# Patient Record
Sex: Female | Born: 1970
Health system: Southern US, Community
[De-identification: ages and names within clinical notes are randomized; demographics above are authoritative.]

## PROBLEM LIST (undated history)

## (undated) DIAGNOSIS — N3090 Cystitis, unspecified without hematuria: Secondary | ICD-10-CM

## (undated) DIAGNOSIS — K219 Gastro-esophageal reflux disease without esophagitis: Secondary | ICD-10-CM

## (undated) DIAGNOSIS — R519 Headache, unspecified: Secondary | ICD-10-CM

## (undated) DIAGNOSIS — Z9889 Other specified postprocedural states: Secondary | ICD-10-CM

## (undated) DIAGNOSIS — D219 Benign neoplasm of connective and other soft tissue, unspecified: Secondary | ICD-10-CM

## (undated) DIAGNOSIS — T7840XA Allergy, unspecified, initial encounter: Secondary | ICD-10-CM

## (undated) DIAGNOSIS — E538 Deficiency of other specified B group vitamins: Secondary | ICD-10-CM

## (undated) DIAGNOSIS — A749 Chlamydial infection, unspecified: Secondary | ICD-10-CM

## (undated) DIAGNOSIS — B009 Herpesviral infection, unspecified: Secondary | ICD-10-CM

## (undated) DIAGNOSIS — R87619 Unspecified abnormal cytological findings in specimens from cervix uteri: Secondary | ICD-10-CM

## (undated) DIAGNOSIS — Z8719 Personal history of other diseases of the digestive system: Secondary | ICD-10-CM

## (undated) DIAGNOSIS — I499 Cardiac arrhythmia, unspecified: Secondary | ICD-10-CM

## (undated) DIAGNOSIS — T783XXA Angioneurotic edema, initial encounter: Secondary | ICD-10-CM

## (undated) DIAGNOSIS — J45909 Unspecified asthma, uncomplicated: Secondary | ICD-10-CM

## (undated) DIAGNOSIS — B9689 Other specified bacterial agents as the cause of diseases classified elsewhere: Secondary | ICD-10-CM

## (undated) DIAGNOSIS — M1990 Unspecified osteoarthritis, unspecified site: Secondary | ICD-10-CM

## (undated) DIAGNOSIS — I10 Essential (primary) hypertension: Secondary | ICD-10-CM

## (undated) DIAGNOSIS — R51 Headache: Secondary | ICD-10-CM

## (undated) DIAGNOSIS — R011 Cardiac murmur, unspecified: Secondary | ICD-10-CM

## (undated) DIAGNOSIS — K469 Unspecified abdominal hernia without obstruction or gangrene: Secondary | ICD-10-CM

## (undated) DIAGNOSIS — R112 Nausea with vomiting, unspecified: Secondary | ICD-10-CM

## (undated) DIAGNOSIS — IMO0002 Reserved for concepts with insufficient information to code with codable children: Secondary | ICD-10-CM

## (undated) DIAGNOSIS — D649 Anemia, unspecified: Secondary | ICD-10-CM

## (undated) DIAGNOSIS — N760 Acute vaginitis: Secondary | ICD-10-CM

## (undated) DIAGNOSIS — B379 Candidiasis, unspecified: Secondary | ICD-10-CM

## (undated) HISTORY — DX: Angioneurotic edema, initial encounter: T78.3XXA

## (undated) HISTORY — DX: Unspecified abnormal cytological findings in specimens from cervix uteri: R87.619

## (undated) HISTORY — DX: Candidiasis, unspecified: B37.9

## (undated) HISTORY — DX: Reserved for concepts with insufficient information to code with codable children: IMO0002

## (undated) HISTORY — DX: Herpesviral infection, unspecified: B00.9

## (undated) HISTORY — DX: Unspecified asthma, uncomplicated: J45.909

## (undated) HISTORY — DX: Unspecified osteoarthritis, unspecified site: M19.90

## (undated) HISTORY — DX: Anemia, unspecified: D64.9

## (undated) HISTORY — DX: Cystitis, unspecified without hematuria: N30.90

## (undated) HISTORY — DX: Allergy, unspecified, initial encounter: T78.40XA

## (undated) HISTORY — DX: Chlamydial infection, unspecified: A74.9

## (undated) HISTORY — DX: Unspecified abdominal hernia without obstruction or gangrene: K46.9

## (undated) HISTORY — DX: Cardiac murmur, unspecified: R01.1

## (undated) HISTORY — PX: COLONOSCOPY: SHX174

## (undated) HISTORY — PX: BREAST LUMPECTOMY: SHX2

## (undated) HISTORY — PX: BREAST BIOPSY: SHX20

## (undated) HISTORY — DX: Benign neoplasm of connective and other soft tissue, unspecified: D21.9

## (undated) HISTORY — PX: CHOLECYSTECTOMY: SHX55

## (undated) HISTORY — DX: Acute vaginitis: N76.0

## (undated) HISTORY — DX: Other specified bacterial agents as the cause of diseases classified elsewhere: B96.89

---

## 1999-11-21 ENCOUNTER — Other Ambulatory Visit: Admission: RE | Admit: 1999-11-21 | Discharge: 1999-11-21 | Payer: Self-pay | Admitting: Family Medicine

## 2000-12-28 ENCOUNTER — Encounter: Payer: Self-pay | Admitting: Family Medicine

## 2000-12-28 ENCOUNTER — Encounter: Admission: RE | Admit: 2000-12-28 | Discharge: 2000-12-28 | Payer: Self-pay | Admitting: Family Medicine

## 2002-07-28 ENCOUNTER — Other Ambulatory Visit: Admission: RE | Admit: 2002-07-28 | Discharge: 2002-07-28 | Payer: Self-pay | Admitting: Obstetrics and Gynecology

## 2002-09-16 ENCOUNTER — Encounter: Payer: Self-pay | Admitting: Obstetrics and Gynecology

## 2002-09-16 ENCOUNTER — Ambulatory Visit (HOSPITAL_COMMUNITY): Admission: RE | Admit: 2002-09-16 | Discharge: 2002-09-16 | Payer: Self-pay | Admitting: Obstetrics and Gynecology

## 2002-10-23 ENCOUNTER — Encounter: Admission: RE | Admit: 2002-10-23 | Discharge: 2002-10-23 | Payer: Self-pay | Admitting: Gastroenterology

## 2002-10-23 ENCOUNTER — Encounter: Payer: Self-pay | Admitting: Gastroenterology

## 2002-12-05 ENCOUNTER — Observation Stay (HOSPITAL_COMMUNITY): Admission: RE | Admit: 2002-12-05 | Discharge: 2002-12-06 | Payer: Self-pay | Admitting: Surgery

## 2002-12-05 ENCOUNTER — Encounter (INDEPENDENT_AMBULATORY_CARE_PROVIDER_SITE_OTHER): Payer: Self-pay | Admitting: *Deleted

## 2002-12-05 ENCOUNTER — Encounter: Payer: Self-pay | Admitting: Surgery

## 2003-01-19 ENCOUNTER — Other Ambulatory Visit: Admission: RE | Admit: 2003-01-19 | Discharge: 2003-01-19 | Payer: Self-pay | Admitting: Obstetrics and Gynecology

## 2003-07-30 ENCOUNTER — Other Ambulatory Visit: Admission: RE | Admit: 2003-07-30 | Discharge: 2003-07-30 | Payer: Self-pay | Admitting: Obstetrics and Gynecology

## 2003-09-17 ENCOUNTER — Other Ambulatory Visit: Admission: RE | Admit: 2003-09-17 | Discharge: 2003-09-17 | Payer: Self-pay | Admitting: Obstetrics and Gynecology

## 2003-11-27 ENCOUNTER — Other Ambulatory Visit: Admission: RE | Admit: 2003-11-27 | Discharge: 2003-11-27 | Payer: Self-pay | Admitting: Obstetrics and Gynecology

## 2004-04-29 ENCOUNTER — Other Ambulatory Visit: Admission: RE | Admit: 2004-04-29 | Discharge: 2004-04-29 | Payer: Self-pay | Admitting: Obstetrics and Gynecology

## 2004-08-03 ENCOUNTER — Emergency Department (HOSPITAL_COMMUNITY): Admission: EM | Admit: 2004-08-03 | Discharge: 2004-08-03 | Payer: Self-pay | Admitting: Emergency Medicine

## 2004-08-05 ENCOUNTER — Other Ambulatory Visit: Admission: RE | Admit: 2004-08-05 | Discharge: 2004-08-05 | Payer: Self-pay | Admitting: Obstetrics and Gynecology

## 2004-08-23 ENCOUNTER — Encounter: Admission: RE | Admit: 2004-08-23 | Discharge: 2004-08-23 | Payer: Self-pay | Admitting: Neurology

## 2004-10-14 ENCOUNTER — Ambulatory Visit (HOSPITAL_COMMUNITY): Admission: RE | Admit: 2004-10-14 | Discharge: 2004-10-14 | Payer: Self-pay | Admitting: Gastroenterology

## 2004-10-14 ENCOUNTER — Encounter (INDEPENDENT_AMBULATORY_CARE_PROVIDER_SITE_OTHER): Payer: Self-pay | Admitting: Specialist

## 2005-05-11 ENCOUNTER — Encounter: Admission: RE | Admit: 2005-05-11 | Discharge: 2005-05-11 | Payer: Self-pay | Admitting: Family Medicine

## 2005-08-11 ENCOUNTER — Other Ambulatory Visit: Admission: RE | Admit: 2005-08-11 | Discharge: 2005-08-11 | Payer: Self-pay | Admitting: Obstetrics and Gynecology

## 2005-11-28 ENCOUNTER — Encounter: Admission: RE | Admit: 2005-11-28 | Discharge: 2005-11-28 | Payer: Self-pay | Admitting: Family Medicine

## 2006-08-13 ENCOUNTER — Other Ambulatory Visit: Admission: RE | Admit: 2006-08-13 | Discharge: 2006-08-13 | Payer: Self-pay | Admitting: Obstetrics and Gynecology

## 2007-09-11 ENCOUNTER — Encounter: Admission: RE | Admit: 2007-09-11 | Discharge: 2007-09-11 | Payer: Self-pay | Admitting: Family Medicine

## 2010-06-14 ENCOUNTER — Encounter: Admission: RE | Admit: 2010-06-14 | Discharge: 2010-06-14 | Payer: Self-pay | Admitting: Obstetrics and Gynecology

## 2010-07-22 ENCOUNTER — Encounter: Admission: RE | Admit: 2010-07-22 | Discharge: 2010-07-22 | Payer: Self-pay | Admitting: Surgery

## 2010-10-16 ENCOUNTER — Encounter: Payer: Self-pay | Admitting: Family Medicine

## 2011-02-10 NOTE — Op Note (Signed)
NAME:  RAFEEF, Jaclyn Haynes                       ACCOUNT NO.:  [id number]   MEDICAL RECORD NO.:  [id number]                   PATIENT TYPE:  AMB   LOCATION:  DAY                                  FACILITY:  [facility]   PHYSICIAN:  Abigail Miyamoto, M.D.              DATE OF BIRTH:  January 06, 1971   DATE OF PROCEDURE:  12/05/2002  DATE OF DISCHARGE:                                 OPERATIVE REPORT   PREOPERATIVE DIAGNOSIS:  Biliary dyskinesia.   POSTOPERATIVE DIAGNOSIS:  Biliary dyskinesia.   PROCEDURE:  Laparoscopic cholecystectomy with intraoperative cholangiogram.   SURGEON:  Douglas A. Magnus Ivan, M.D.   ASSISTANT:  Angelia Mould. Derrell Lolling, M.D.   ANESTHESIA:  General endotracheal anesthesia.   ESTIMATED BLOOD LOSS:  Minimal.   FINDINGS:  The patient was found to have a normal cholangiogram.   PROCEDURE IN DETAIL:  The patient brought to the operating room and  identified as Jaclyn Haynes.  She was placed supine on the operating room  table, and general anesthesia was induced.  Her abdomen was then prepped and  draped in the usual sterile fashion.  Using a #11 blade, a small transverse  incision was made at the umbilicus.  The incision was carried down through  the fascia which was then opened with a scalpel.  A hemostat was then used  to pass into the peritoneal cavity.  Next, a 0 Vicryl pursestring suture was  placed around the fascial opening.  The Hasson port was placed through the  opening, and insufflation of the abdomen was begun.  Next, an 11 mm port was  placed in the patient's epigastrium, and two 5 mm ports were placed in the  patient's right flank under direct vision.  The gallbladder was then  identified and retracted above the liver bed.  Dissection was then carried  out in the base of the gallbladder.  The cystic duct was dissected out.  A  small adhesion from the cystic duct to the capsule of liver was taken down  with electrocautery. This tore the liver capsule some, but  the tear was  controlled with the electrocautery.  Cystic duct was then clipped once  distally.  It was then partly transected with the laparoscopic scissors.  An  angiocatheter was inserted in the right upper quadrant under direct vision.  The cholangiocatheter was then placed through the Angiocath and then put  into the opening of the cystic duct.  A cholangiogram was then performed  under direct fluoroscopy.  Good flow of contrast was seen through the entire  biliary system and duodenum without evidence of abnormality.  Next the  cholangiocatheter was then removed.  The cystic duct was then clipped three  times proximally and transected with scissors.  The cystic artery was then  identified and twice proximally, once distally, and transected as well.  The  gallbladder was then slowly dissected free from the liver bed with the  electrocautery.  Once the gallbladder was freed from the liver bed, the  liver bed was examined and again, hemostasis was achieved in the liver bed  as well as on the capsular tear.  The gallbladder was then removed through  the incision at the umbilicus.  The 0 Vicryl then brought out and tied in  place, closing the fascial defect.  The abdomen was then copiously irrigated  with normal saline.  Again, hemostasis appeared to be achieved.  All ports  were then removed under direct vision, and the abdomen was deflated.  All  incisions were then anesthetized with 0.5% Marcaine and  then closed with 4-0 Monocryl subcuticular sutures.  Steri-Strips, gauze,  and tape were then applied.  The patient tolerated the procedure well.  All  sponge, needle, and instrument counts were correct at the end of the  procedure.  The patient was then extubated in the operating room and taken  in stable condition to the recovery room.                                               Abigail Miyamoto, M.D.    DB/MEDQ  D:  12/05/2002  T:  12/05/2002  Job:  [id number]

## 2011-02-10 NOTE — Op Note (Signed)
NAME:  Jaclyn Haynes, Jaclyn Haynes             ACCOUNT NO.:  [id number]   MEDICAL RECORD NO.:  [id number]          PATIENT TYPE:  AMB   LOCATION:  ENDO                         FACILITY:  [facility]   PHYSICIAN:  Anselmo Rod, M.D.  DATE OF BIRTH:  1970/11/24   DATE OF PROCEDURE:  10/14/2004  DATE OF DISCHARGE:                                 OPERATIVE REPORT   PROCEDURE:  Colonoscopy with random biopsies.   ENDOSCOPIST:  Anselmo Rod, M.D.   INSTRUMENT USED:  Olympus video colonoscope.   INDICATIONS FOR PROCEDURE:  The patient is a 40 year old African-American  female with a history of severe diarrhea explosive in nature with mucoid  stools.  Rule out inflammatory bowel disease.  The patient has not responded  to cholestyramine or high fiber diet.   PRE-PROCEDURE PREPARATION:  Informed consent was procured from the patient.  The patient was fasted for eight hours prior to the procedure and prepped  with a bottle of magnesium citrate and a gallon of GoLYTELY the night prior  to the procedure.  The risks and benefits of the procedure were discussed  with the patient including 10% miss rate of cancer and polyps.   PRE-PROCEDURE PHYSICAL:  VITAL SIGNS:  The patient has stable vital signs.  NECK:  Supple.  CHEST:  Clear to auscultation.  CARDIOVASCULAR:  S1 and S2 are regular.  ABDOMEN:  Soft with normal bowel sounds.   DESCRIPTION OF PROCEDURE:  The patient was placed in the left lateral  decubitus position and sedated with 75 mg of Demerol and 6 mg of Versed in  slow incremental doses.  Once the patient was adequately sedated and  maintained on low flow oxygen and continuous cardiac monitoring, the Olympus  video colonoscope was advanced from the rectum to the cecum. The appendiceal  orifice and ileocecal valve were clearly visualized and photographed.  The  entire colonic mucosa appeared healthy including the terminal ileum.  Random  colon biopsies were done to rule out microscopic or  collagenous colitis.  There was no evidence of inflammatory bowel disease.  Retroflexion in the  rectum revealed no abnormalities.  The patient tolerated the procedure well  without any complications.   IMPRESSION:  Normal colonoscopy up to the terminal ileum, random colon  biopsies done; results pending.   RECOMMENDATIONS:  1.  Await pathology results.  2.  Trial of antispasmodics, Levbid 0.375 mg b.i.d. has been called into the      patient's pharmacy for the next one month.  3.  Outpatient follow-up in the next two weeks for further recommendations.                                               ______________________________  Anselmo Rod, M.D.  Electronically Signed 10/17/2004 11:20:16am EST    JNM/MEDQ  D:  10/14/2004  T:  10/14/2004  Job:  [id number]   cc:   Gretta Arab. Valentina Lucks, M.D.  [address]  [place] [address]  Fax: [phone]

## 2011-08-15 ENCOUNTER — Other Ambulatory Visit: Payer: Self-pay | Admitting: Obstetrics and Gynecology

## 2011-08-15 DIAGNOSIS — Z1231 Encounter for screening mammogram for malignant neoplasm of breast: Secondary | ICD-10-CM

## 2011-09-22 ENCOUNTER — Ambulatory Visit
Admission: RE | Admit: 2011-09-22 | Discharge: 2011-09-22 | Disposition: A | Discharge status: Home / Self Care | Payer: 59 | Source: Ambulatory Visit | Attending: Obstetrics and Gynecology | Admitting: Obstetrics and Gynecology

## 2011-09-22 DIAGNOSIS — Z1231 Encounter for screening mammogram for malignant neoplasm of breast: Secondary | ICD-10-CM

## 2012-03-24 ENCOUNTER — Other Ambulatory Visit: Payer: Self-pay | Admitting: Obstetrics and Gynecology

## 2012-03-25 NOTE — Telephone Encounter (Deleted)
Can this pt have rx?

## 2012-08-09 ENCOUNTER — Encounter: Payer: Self-pay | Admitting: Obstetrics and Gynecology

## 2012-08-13 ENCOUNTER — Encounter: Payer: Self-pay | Admitting: Obstetrics and Gynecology

## 2012-08-13 ENCOUNTER — Encounter (INDEPENDENT_AMBULATORY_CARE_PROVIDER_SITE_OTHER): Payer: 59 | Admitting: Obstetrics and Gynecology

## 2012-08-13 NOTE — Progress Notes (Deleted)
Last Pap: 08/30/11 WNL: Yes Regular Periods:no Contraception: pill-yasmin  Monthly Breast exam:no Tetanus<57yrs:yes Nl.Bladder Function:no urinary frequency  Daily BMs:no Healthy Diet:no Calcium:yes Mammogram:yes Date of Mammogram: 09/22/11 Exercise:{YES NO:22349} Have often Exercise: *** Seatbelt: {yes no:314532} Abuse at home: {yes no:314532} Stressful work:{yes no:314532} Sigmoid-colonoscopy: *** Bone Density: {EXAM; YES/NO:19492::"No"} PCP: *** Change in PMH: *** Change in [facility]:***

## 2012-08-27 ENCOUNTER — Other Ambulatory Visit: Payer: Self-pay | Admitting: Obstetrics and Gynecology

## 2012-08-27 DIAGNOSIS — Z1231 Encounter for screening mammogram for malignant neoplasm of breast: Secondary | ICD-10-CM

## 2012-09-05 ENCOUNTER — Encounter: Payer: Self-pay | Admitting: Obstetrics and Gynecology

## 2012-09-05 ENCOUNTER — Ambulatory Visit (INDEPENDENT_AMBULATORY_CARE_PROVIDER_SITE_OTHER): Payer: 59 | Admitting: Obstetrics and Gynecology

## 2012-09-05 VITALS — BP 112/74 | Ht 64.0 in | Wt 215.0 lb

## 2012-09-05 DIAGNOSIS — Z01419 Encounter for gynecological examination (general) (routine) without abnormal findings: Secondary | ICD-10-CM

## 2012-09-05 MED ORDER — VALACYCLOVIR HCL 500 MG PO TABS: 500.0000 mg | ORAL_TABLET | Freq: Every day | ORAL | Status: DC

## 2012-09-05 MED ORDER — DROSPIRENONE-ETHINYL ESTRADIOL 3-0.03 MG PO TABS: 1.0000 | ORAL_TABLET | Freq: Every day | ORAL | Status: DC

## 2012-09-05 MED ORDER — IBUPROFEN 600 MG PO TABS: 600.0000 mg | ORAL_TABLET | Freq: Four times a day (QID) | ORAL | Status: DC | PRN

## 2012-09-05 NOTE — Patient Instructions (Signed)
Exercise to Stay Healthy Exercise helps you become and stay healthy. EXERCISE IDEAS AND TIPS Choose exercises that:  You enjoy.  Fit into your day. You do not need to exercise really hard to be healthy. You can do exercises at a slow or medium level and stay healthy. You can:  Stretch before and after working out.  Try yoga, Pilates, or tai chi.  Lift weights.  Walk fast, swim, jog, run, climb stairs, bicycle, dance, or rollerskate.  Take aerobic classes. Exercises that burn about 150 calories:  Running 1  miles in 15 minutes.  Playing volleyball for 45 to 60 minutes.  Washing and waxing a car for 45 to 60 minutes.  Playing touch football for 45 minutes.  Walking 1  miles in 35 minutes.  Pushing a stroller 1  miles in 30 minutes.  Playing basketball for 30 minutes.  Raking leaves for 30 minutes.  Bicycling 5 miles in 30 minutes.  Walking 2 miles in 30 minutes.  Dancing for 30 minutes.  Shoveling snow for 15 minutes.  Swimming laps for 20 minutes.  Walking up stairs for 15 minutes.  Bicycling 4 miles in 15 minutes.  Gardening for 30 to 45 minutes.  Jumping rope for 15 minutes.  Washing windows or floors for 45 to 60 minutes. Document Released: 10/14/2010 Document Revised: 12/04/2011 Document Reviewed: 10/14/2010 [employer] Patient Information 2013 [employer].  

## 2012-09-05 NOTE — Progress Notes (Signed)
Last Pap: 08/30/11 WNL: Yes Regular Periods:no Contraception: yasmin  Monthly Breast exam:no Tetanus<56yrs:yes Nl.Bladder Function:yes Daily BMs:no Healthy Diet:no Calcium:yes Mammogram:yes Date of Mammogram: 09/25/11 Exercise:no Have often Exercise: 1-2 times a month Seatbelt: yes Abuse at home: no Stressful work:yes Sigmoid-colonoscopy: 2007 PCP: Dr.Redmon Change in PMH: unchanged Change in ZOX:[id number]  Pt stated no issues today.  BP 112/74  Ht 5\' 4"  (1.626 m)  Wt 215 lb (97.523 kg)  BMI 36.90 kg/m2  LMP 06/14/2012 Pt with complaints:no Physical Examination: General appearance - alert, well appearing, and in no distress Mental status - normal mood, behavior, speech, dress, motor activity, and thought processes Neck - supple, no significant adenopathy,  thyroid exam: thyroid is normal in size without nodules or tenderness Chest - clear to auscultation, no wheezes, rales or rhonchi, symmetric air entry Heart - normal rate and regular rhythm Abdomen - soft, nontender, nondistended, no masses or organomegaly Breasts - breasts appear normal, no suspicious masses, no skin or nipple changes or axillary nodes Pelvic - normal external genitalia, vulva, vagina, cervix, uterus and adnexa Rectal - rectal exam not indicated Back exam - full range of motion, no tenderness, palpable spasm or pain on motion Neurological - alert, oriented, normal speech, no focal findings or movement disorder noted Musculoskeletal - no joint tenderness, deformity or swelling Extremities - no edema, redness or tenderness in the calves or thighs Skin - normal coloration and turgor, no rashes, no suspicious skin lesions noted CPP pt stable uses motrin prn Recurrent BV uses boric acid supp prn Routine exam Pap sent yes Mammogram due yes already scheduled Yasmin used for contraception RT 1 yr

## 2012-09-06 LAB — PAP IG W/ RFLX HPV ASCU

## 2012-10-01 NOTE — Progress Notes (Signed)
Error encounter. 

## 2012-10-04 ENCOUNTER — Ambulatory Visit
Admission: RE | Admit: 2012-10-04 | Discharge: 2012-10-04 | Disposition: A | Discharge status: Home / Self Care | Payer: 59 | Source: Ambulatory Visit | Attending: Obstetrics and Gynecology | Admitting: Obstetrics and Gynecology

## 2012-10-04 DIAGNOSIS — Z1231 Encounter for screening mammogram for malignant neoplasm of breast: Secondary | ICD-10-CM

## 2012-12-12 ENCOUNTER — Emergency Department (HOSPITAL_COMMUNITY)
Admission: EM | Admit: 2012-12-12 | Discharge: 2012-12-12 | Disposition: A | Discharge status: Home / Self Care | Payer: 59 | Attending: Emergency Medicine | Admitting: Emergency Medicine

## 2012-12-12 ENCOUNTER — Emergency Department (HOSPITAL_COMMUNITY): Payer: 59

## 2012-12-12 ENCOUNTER — Other Ambulatory Visit: Payer: Self-pay

## 2012-12-12 DIAGNOSIS — R0789 Other chest pain: Secondary | ICD-10-CM

## 2012-12-12 DIAGNOSIS — Z8619 Personal history of other infectious and parasitic diseases: Secondary | ICD-10-CM | POA: Insufficient documentation

## 2012-12-12 DIAGNOSIS — Z8719 Personal history of other diseases of the digestive system: Secondary | ICD-10-CM | POA: Insufficient documentation

## 2012-12-12 DIAGNOSIS — R0602 Shortness of breath: Secondary | ICD-10-CM | POA: Insufficient documentation

## 2012-12-12 DIAGNOSIS — Z79899 Other long term (current) drug therapy: Secondary | ICD-10-CM | POA: Insufficient documentation

## 2012-12-12 DIAGNOSIS — R071 Chest pain on breathing: Secondary | ICD-10-CM | POA: Insufficient documentation

## 2012-12-12 DIAGNOSIS — Z8742 Personal history of other diseases of the female genital tract: Secondary | ICD-10-CM | POA: Insufficient documentation

## 2012-12-12 LAB — CBC
Platelets: 191 10*3/uL (ref 150–400)
RBC: 4.36 MIL/uL (ref 3.87–5.11)
RDW: 12.8 % (ref 11.5–15.5)
WBC: 7.9 10*3/uL (ref 4.0–10.5)

## 2012-12-12 LAB — BASIC METABOLIC PANEL
CO2: 23 mEq/L (ref 19–32)
Calcium: 9.5 mg/dL (ref 8.4–10.5)
Chloride: 101 mEq/L (ref 96–112)
GFR calc Af Amer: >90 mL/min (ref >90)
Sodium: 135 mEq/L (ref 135–145)

## 2012-12-12 MED ORDER — NITROGLYCERIN 0.4 MG SL SUBL: 0.4000 mg | SUBLINGUAL_TABLET | SUBLINGUAL | Status: DC | PRN

## 2012-12-12 MED ORDER — IBUPROFEN 800 MG PO TABS: 800.0000 mg | ORAL_TABLET | Freq: Three times a day (TID) | ORAL | Status: DC | PRN

## 2012-12-12 MED ORDER — ASPIRIN 325 MG PO TABS
325.0000 mg | ORAL_TABLET | ORAL | Status: AC
  Administered 2012-12-12: 325 mg via ORAL
  Filled 2012-12-12: qty 1

## 2012-12-12 NOTE — ED Provider Notes (Signed)
History     CSN: [id number]  Arrival date & time 12/12/12  1100   First MD Initiated Contact with Patient 12/12/12 1253      Chief Complaint  Patient presents with  . Chest Pain    (Consider location/radiation/quality/duration/timing/severity/associated sxs/prior treatment) Patient is a 42 y.o. female presenting with chest pain.  Chest Pain  Pt with no significant PMH reports she was in her normal state of health at work this morning when she began to have sharp, moderate to severe pain in R parasternal chest, initially associated with SOB. Worse with movement and palpation. No fever or cough. No known CAD, had neg stress test about 6 months ago, echo then showed valve prolapse, unsure which valves. She has family history of heart disease.   Past Medical History  Diagnosis Date  . Yeast infection   . Chlamydia   . BV (bacterial vaginosis)   . HSV-2 infection   . Fibroids   . Abnormal Pap smear     CIN1  . Hernia   . Bladder infection     Past Surgical History  Procedure Laterality Date  . Breast biopsy      Family History  Problem Relation Age of Onset  . Stroke Mother   . Hypertension Mother   . COPD Mother   . Thyroid nodules Mother   . Hyperlipidemia Mother   . Heart disease Father   . Hypertension Father   . Hyperlipidemia Father   . Hypertension Maternal Grandmother   . Hyperlipidemia Maternal Grandmother   . Diabetes Maternal Grandmother   . Thyroid nodules Maternal Grandmother   . Hypertension Maternal Grandfather   . Hyperlipidemia Maternal Grandfather   . Prostate cancer Maternal Grandfather   . Hypertension Paternal Grandmother   . Heart disease Paternal Grandfather   . Hypertension Paternal Grandfather     History  Substance Use Topics  . Smoking status: Never Smoker   . Smokeless tobacco: Never Used  . Alcohol Use: Yes     Comment: occassionally     OB History   Grav Para Term Preterm Abortions TAB SAB Ect Mult Living   0                Review of Systems  Cardiovascular: Positive for chest pain.   All other systems reviewed and are negative except as noted in HPI.    Allergies  Review of patient's allergies indicates no known allergies.  Home Medications   Current Outpatient Rx  Name  Route  Sig  Dispense  Refill  . ALBUTEROL IN   Inhalation   Inhale into the lungs.         . Calcium Carbonate (CALCIUM 600 PO)   Oral   Take by mouth.         . Cetirizine HCl (ZYRTEC PO)   Oral   Take by mouth.         . drospirenone-ethinyl estradiol (YASMIN,ZARAH,SYEDA) 3-0.03 MG tablet   Oral   Take 1 tablet by mouth daily.   1 Package   11   . Fluticasone-Salmeterol (ADVAIR DISKUS IN)   Inhalation   Inhale into the lungs.         . Glucosamine-Chondroitin (GLUCOSAMINE CHONDR COMPLEX PO)   Oral   Take by mouth.         Marland Kitchen ibuprofen (ADVIL,MOTRIN) 600 MG tablet   Oral   Take 1 tablet (600 mg total) by mouth every 6 (six) hours as needed for pain.  30 tablet   0     Do not take with anaprox or mobic   . LYSINE PO   Oral   Take by mouth.         . Meloxicam (MOBIC PO)   Oral   Take by mouth.         . naproxen (NAPROSYN) 500 MG tablet      TAKE 1 TABLET BY MOUTH TWICE DAILY AS NEEDED   60 tablet   0   . Ranitidine HCl (ZANTAC PO)   Oral   Take by mouth.         . valACYclovir (VALTREX) 500 MG tablet   Oral   Take 1 tablet (500 mg total) by mouth daily.   30 tablet   11   . ZOLMitriptan (ZOMIG) 2.5 MG tablet   Oral   Take 2.5 mg by mouth as needed.           BP 121/89  Pulse 79  Temp(Src) 98.8 F (37.1 C) (Oral)  Resp 18  SpO2 100%  LMP 11/27/2012  Physical Exam  Nursing note and vitals reviewed. Constitutional: She is oriented to person, place, and time. She appears well-developed and well-nourished.  HENT:  Head: Normocephalic and atraumatic.  Eyes: EOM are normal. Pupils are equal, round, and reactive to light.  Neck: Normal range of motion. Neck  supple.  Cardiovascular: Normal rate, normal heart sounds and intact distal pulses.   Pulmonary/Chest: Effort normal and breath sounds normal. She exhibits tenderness (R parasternal chest wall).  Abdominal: Bowel sounds are normal. She exhibits no distension. There is no tenderness.  Musculoskeletal: Normal range of motion. She exhibits no edema and no tenderness.  Neurological: She is alert and oriented to person, place, and time. She has normal strength. No cranial nerve deficit or sensory deficit.  Skin: Skin is warm and dry. No rash noted.  Psychiatric: She has a normal mood and affect.    ED Course  Procedures (including critical care time)  Labs Reviewed  CBC  BASIC METABOLIC PANEL   Dg Chest 2 View  12/12/2012  *RADIOLOGY REPORT*  Clinical Data: Chest pain.  CHEST - 2 VIEW  Comparison: Chest x-ray 11/10/2011.  Findings: Lung volumes are normal.  No consolidative airspace disease.  No pleural effusions.  No pneumothorax.  No pulmonary nodule or mass noted.  Pulmonary vasculature and the cardiomediastinal silhouette are within normal limits.  Surgical clips project over the right upper quadrant of the abdomen, likely from prior cholecystectomy.  IMPRESSION: 1. No radiographic evidence of acute cardiopulmonary disease.   Original Report Authenticated By: Trudie Reed, M.D.      1. Chest wall pain       MDM   Date: 12/12/2012  Rate: 87  Rhythm: normal sinus rhythm  QRS Axis: normal  Intervals: normal  ST/T Wave abnormalities: normal  Conduction Disutrbances: none  Narrative Interpretation: unremarkable   Pt with reproducible chest wall pain. No concern for ACS/CAD. PERC neg. Advised NSAIDs and PCP followup.          Mazelle Huebert B. Bernette Mayers, MD 12/12/12 [id number]

## 2012-12-12 NOTE — ED Notes (Addendum)
Pt reports sudden onset of right sided chest pain approx 2 hours prior to arrival while sitting at work, described as stabbing,  denies nausea, vomiting, diaphoresis. Pain increases with palpation and deep inspiration.

## 2013-08-01 ENCOUNTER — Telehealth: Payer: Self-pay | Admitting: Neurology

## 2013-08-01 NOTE — Telephone Encounter (Signed)
Called patient to reschedule appt per yan, couldn't reach patient cancelled appt ask patient to call back and reschedule. °

## 2013-09-24 ENCOUNTER — Ambulatory Visit: Payer: Self-pay | Admitting: Nurse Practitioner

## 2013-10-08 ENCOUNTER — Other Ambulatory Visit: Payer: Self-pay | Admitting: Obstetrics and Gynecology

## 2013-10-27 ENCOUNTER — Ambulatory Visit (INDEPENDENT_AMBULATORY_CARE_PROVIDER_SITE_OTHER): Payer: 59 | Admitting: Nurse Practitioner

## 2013-10-27 ENCOUNTER — Encounter: Payer: Self-pay | Admitting: Nurse Practitioner

## 2013-10-27 ENCOUNTER — Encounter (INDEPENDENT_AMBULATORY_CARE_PROVIDER_SITE_OTHER): Payer: Self-pay

## 2013-10-27 VITALS — BP 126/83 | HR 80 | Ht 64.0 in | Wt 230.0 lb

## 2013-10-27 DIAGNOSIS — R42 Dizziness and giddiness: Secondary | ICD-10-CM | POA: Insufficient documentation

## 2013-10-27 DIAGNOSIS — R209 Unspecified disturbances of skin sensation: Secondary | ICD-10-CM

## 2013-10-27 NOTE — Progress Notes (Signed)
[facility]  PATIENT: Jaclyn Haynes DOB: November 17, 1970   REASON FOR VISIT: Followup for dizziness and numbness.   HISTORY OF PRESENT ILLNESS: Jaclyn Haynes, 43 year old female returns for followup. She was last seen 10/02/2012. She has a history of right foot numbness, pins and needle sensation and  episodes that her fingertips on the right with numbness. Symptoms come and go. MRI of the brain has been normal EMG nerve conduction without evidence of large fiber peripheral neuropathy or lumbsacral radiculopathy. She also has a history of migraines and sees Dr. Melton Alar. Returns for reevaluation    HISTORY: evaluation of numbness, and dizziness. She was initially seen by Dr. Krista Blue 08/10/11.   She had past medical history of migraine headache, over the past 6 months, without triggering events, she noticed intermittent right foot numbness, pins needle, loss of feeling at her right foot, which can last minutes to hours, initially this happen on a daily basis, over the past few months,  it is getting better, only couple times a week now, she had this occasionally similar symptoms involving her left foot  In addition she has episode of fingertips numbness tingling, sometimes surge of pain in her right leg, she has chronic low back pain but stay at that midline, there was no shooting pain to her lower extremity  She denies gait difficulty, she denies incontinence, no visual loss, over the past few months, she also he noticed intermittent dizziness, she felt off-balance when she got up in the morning, or turn quickly.  10/02/12: Patient returns for followup. Continues with right foot numbness, pins needle, loss of feeling at her right foot, she also has episode of fingertips numbness tingling although symptoms have improved.  MRI of the brain was normal. EMG/Celebration without evidence of large fiber peripheral neuropathy or right lumbosacral radiculopathy.    REVIEW OF SYSTEMS: Full 14 system  review of systems performed and notable only for those listed, all others are neg:  Constitutional: Fatigue  Cardiovascular: N/A  Ear/Nose/Throat: N/A  Skin: N/A  Eyes: N/A  Respiratory: N/A  Gastroitestinal: N/A  Hematology/Lymphatic: Easy bruising  Endocrine: N/A Musculoskeletal: Joint pain, achy muscles Allergy/Immunology: N/A  Neurological: Headache numbness  Psychiatric: N/A   ALLERGIES: No Known Allergies  HOME MEDICATIONS: Outpatient Prescriptions Prior to Visit  Medication Sig Dispense Refill  . albuterol (PROVENTIL HFA;VENTOLIN HFA) 108 (90 BASE) MCG/ACT inhaler Inhale 2 puffs into the lungs every 6 (six) hours as needed for wheezing.      . Calcium Carbonate (CALCIUM 600 PO) Take 1 tablet by mouth daily.       . Cetirizine HCl (ZYRTEC PO) Take 1 tablet by mouth daily.       . Fluticasone-Salmeterol (ADVAIR) 250-50 MCG/DOSE AEPB Inhale 1 puff into the lungs every 12 (twelve) hours.      Marland Kitchen ibuprofen (ADVIL,MOTRIN) 800 MG tablet Take 1 tablet (800 mg total) by mouth every 8 (eight) hours as needed for pain.  30 tablet  0  . meloxicam (MOBIC) 7.5 MG tablet Take 7.5 mg by mouth daily as needed for pain.      . mometasone (NASONEX) 50 MCG/ACT nasal spray Place 2 sprays into the nose daily.      . Ranitidine HCl (ZANTAC PO) Take 1 tablet by mouth 2 (two) times daily.        No facility-administered medications prior to visit.    PAST MEDICAL HISTORY: Past Medical History  Diagnosis Date  . Yeast infection   . Chlamydia   .  BV (bacterial vaginosis)   . HSV-2 infection   . Fibroids   . Abnormal Pap smear     CIN1  . Hernia   . Bladder infection     PAST SURGICAL HISTORY: Past Surgical History  Procedure Laterality Date  . Breast biopsy      FAMILY HISTORY: Family History  Problem Relation Age of Onset  . Stroke Mother   . Hypertension Mother   . COPD Mother   . Thyroid nodules Mother   . Hyperlipidemia Mother   . Heart disease Father   . Hypertension  Father   . Hyperlipidemia Father   . Hypertension Maternal Grandmother   . Hyperlipidemia Maternal Grandmother   . Diabetes Maternal Grandmother   . Thyroid nodules Maternal Grandmother   . Hypertension Maternal Grandfather   . Hyperlipidemia Maternal Grandfather   . Prostate cancer Maternal Grandfather   . Hypertension Paternal Grandmother   . Heart disease Paternal Grandfather   . Hypertension Paternal Grandfather     SOCIAL HISTORY: History   Social History  . Marital Status: Single    Spouse Name: N/A    Number of Children: 0  . Years of Education: Grad   Occupational History  .  [employer] History Main Topics  . Smoking status: Never Smoker   . Smokeless tobacco: Never Used  . Alcohol Use: Yes     Comment: occassionally   . Drug Use: No  . Sexual Activity: Yes    Birth Control/ Protection: Pill     Comment: yasmin    Other Topics Concern  . Not on file   Social History Narrative   Patient is single, has 0 children   Patient is right handed   Education level is grad school   Caffeine consumption is 1 cup daily     PHYSICAL EXAM  Filed Vitals:   10/27/13 1403 10/27/13 1405  BP: 123/80 126/83  Pulse: 84 80  Height: 5\' 4"  (1.626 m)   Weight: 230 lb (104.327 kg)    Body mass index is 39.46 kg/(m^2).  Generalized: Well developed, obese female in no acute distress  Head: normocephalic and atraumatic,. Oropharynx benign, moderate amount cerumen bil Neurological examination   Mentation: Alert oriented to time, place, history taking. Follows all commands speech and language fluent  Cranial nerve II-XII: Pupils  were equal round reactive to light extraocular movements were full, visual field were full on confrontational test. Facial sensation and strength were normal. hearing was intact to finger rubbing bilaterally. Uvula tongue midline. head turning and shoulder shrug were normal and symmetric.Tongue protrusion into cheek strength was  normal. Motor: normal bulk and tone, full strength in the BUE, BLE, fine finger movements normal, no pronator drift. No focal weakness Sensory: normal and symmetric to light touch, pinprick, and  vibration  Coordination: finger-nose-finger, heel-to-shin bilaterally, no dysmetria Reflexes: Brachioradialis 2/2, biceps 2/2, triceps 2/2, patellar 2/2, Achilles 2/2, plantar responses were flexor bilaterally. Gait and Station: Rising up from seated position without assistance, normal stance,  moderate stride, good arm swing, smooth turning, able to perform tiptoe, and heel walking without difficulty. Tandem gait is steady  DIAGNOSTIC DATA (LABS, IMAGING, TESTING) - I reviewed patient records, labs, notes, testing and imaging myself where available.  Lab Results  Component Value Date   WBC 7.9 12/12/2012   HGB 12.9 12/12/2012   HCT 38.1 12/12/2012   MCV 87.4 12/12/2012   PLT 191 12/12/2012      Component Value Date/Time  NA 135 12/12/2012 1407   K 3.8 12/12/2012 1407   CL 101 12/12/2012 1407   CO2 23 12/12/2012 1407   GLUCOSE 77 12/12/2012 1407   BUN 11 12/12/2012 1407   CREATININE 0.70 12/12/2012 1407   CALCIUM 9.5 12/12/2012 1407   GFRNONAA >90 12/12/2012 1407   GFRAA >90 12/12/2012 1407       ASSESSMENT AND PLAN  43 y.o. year old female  has a past medical history of right foot left foot paresthesias in episodes of dizziness. MRI of the brain normal EMG nerve conduction without evidence of large fiber peripheral neuropathy. Her migraines are treated by Dr. Melton Alar.   Call for  any changes in symptoms F/U yearly Dennie Bible, [facility], [facility], [facility] [address] [employer] [place] [place], [place] [address] [phone]

## 2013-10-27 NOTE — Patient Instructions (Signed)
report any changes in symptoms F/U yearly

## 2013-12-04 ENCOUNTER — Other Ambulatory Visit: Payer: Self-pay

## 2013-12-04 DIAGNOSIS — Z1231 Encounter for screening mammogram for malignant neoplasm of breast: Secondary | ICD-10-CM

## 2013-12-16 ENCOUNTER — Ambulatory Visit
Admission: RE | Admit: 2013-12-16 | Discharge: 2013-12-16 | Disposition: A | Discharge status: Home / Self Care | Payer: 59 | Source: Ambulatory Visit

## 2013-12-16 DIAGNOSIS — Z1231 Encounter for screening mammogram for malignant neoplasm of breast: Secondary | ICD-10-CM

## 2014-08-09 ENCOUNTER — Encounter: Payer: Self-pay | Admitting: *Deleted

## 2014-10-27 ENCOUNTER — Ambulatory Visit (INDEPENDENT_AMBULATORY_CARE_PROVIDER_SITE_OTHER): Payer: 59 | Admitting: Neurology

## 2014-10-27 ENCOUNTER — Ambulatory Visit: Payer: 59 | Admitting: Nurse Practitioner

## 2014-10-27 ENCOUNTER — Encounter: Payer: Self-pay | Admitting: Neurology

## 2014-10-27 VITALS — BP 128/82 | HR 78 | Ht 64.0 in | Wt 245.0 lb

## 2014-10-27 DIAGNOSIS — G43009 Migraine without aura, not intractable, without status migrainosus: Secondary | ICD-10-CM | POA: Insufficient documentation

## 2014-10-27 MED ORDER — RIZATRIPTAN BENZOATE 10 MG PO TBDP: 10.0000 mg | ORAL_TABLET | ORAL | Status: DC | PRN

## 2014-10-27 MED ORDER — TOPIRAMATE 50 MG PO TABS: ORAL_TABLET | ORAL | Status: DC

## 2014-10-27 NOTE — Progress Notes (Signed)
[facility]  PATIENT: Jaclyn Haynes DOB: 05/19/1971  HISTORY OF PRESENT ILLNESS: Jaclyn Haynes is a 44 years old female, follow-up for migraine, right foot discomfort  She had past medical history of migraine headache, over the past 6 months, without triggering events, she noticed intermittent right foot numbness, pins needle, loss of feeling at her right foot, which can last minutes to hours, initially this happen on a daily basis, over the past few months,  it is getting better, only couple times a week now, she had this occasionally similar symptoms involving her left foot  In addition she has episode of fingertips numbness tingling, sometimes surge of pain in her right leg, she has chronic low back pain but stay at that midline, there was no shooting pain to her lower extremity  She denies gait difficulty, she denies incontinence, no visual loss, over the past few months, she also he noticed intermittent dizziness, she felt off-balance when she got up in the morning, or turn quickly.  10/02/12: Patient returns for followup. Continues with right foot numbness, pins needle, loss of feeling at her right foot, she also has episode of fingertips numbness tingling although symptoms have improved.  MRI of the brain was normal. EMG/[place] without evidence of large fiber peripheral neuropathy or right lumbosacral radiculopathy.   UPDATE Feb 2nd 2016: Last visit was in Feb 2015, she still has intermittent right hand swelling, right foot paresthesia, she has a lot of pain on right lateral foot, especially when bearing weight, difficulty sleeping,  She was started she is now taking Topamax 50 mg twice a day, does has side effect of excessive drowsiness 1, she also has intermittent lightheaded, dizziness sensation, with no significant headaches, has been taking over-the-counter meclizine,   REVIEW OF SYSTEMS: Full 14 system review of systems performed and notable only for those listed,  all others are neg:     ALLERGIES: No Known Allergies  HOME MEDICATIONS: Outpatient Prescriptions Prior to Visit  Medication Sig Dispense Refill  . albuterol (PROVENTIL HFA;VENTOLIN HFA) 108 (90 BASE) MCG/ACT inhaler Inhale 2 puffs into the lungs every 6 (six) hours as needed for wheezing.    Marland Kitchen AMETHIA 0.15-0.03 &0.01 MG tablet     . Calcium Carbonate (CALCIUM 600 PO) Take 1 tablet by mouth daily.     . Cetirizine HCl (ZYRTEC PO) Take 1 tablet by mouth daily.     . Esomeprazole Magnesium (NEXIUM PO) Take by mouth.    . Fluticasone-Salmeterol (ADVAIR) 250-50 MCG/DOSE AEPB Inhale 1 puff into the lungs every 12 (twelve) hours.    Marland Kitchen ibuprofen (ADVIL,MOTRIN) 800 MG tablet Take 1 tablet (800 mg total) by mouth every 8 (eight) hours as needed for pain. 30 tablet 0  . levocetirizine (XYZAL) 5 MG tablet     . Meclizine HCl (ANTIVERT PO) Take by mouth.    . meloxicam (MOBIC) 7.5 MG tablet Take 7.5 mg by mouth daily as needed for pain.    . Misc Natural Products (GLUCOSAMINE CHOND COMPLEX/MSM PO) Take by mouth.    . mometasone (NASONEX) 50 MCG/ACT nasal spray Place 2 sprays into the nose daily.    . Multiple Vitamin (MULTIVITAMIN) tablet Take 1 tablet by mouth daily.    Marland Kitchen NAPROXEN DR 500 MG EC tablet     . Naratriptan HCl (AMERGE PO) Take by mouth.    . Probiotic Product (PROBIOTIC DAILY PO) Take by mouth.    . Ranitidine HCl (ZANTAC PO) Take 1 tablet by mouth 2 (  two) times daily.     . valACYclovir (VALTREX) 500 MG tablet     . ZARAH 3-0.03 MG tablet      No facility-administered medications prior to visit.    PAST MEDICAL HISTORY: Past Medical History  Diagnosis Date  . Yeast infection   . Chlamydia   . BV (bacterial vaginosis)   . HSV-2 infection   . Fibroids   . Abnormal Pap smear     CIN1  . Hernia   . Bladder infection     PAST SURGICAL HISTORY: Past Surgical History  Procedure Laterality Date  . Breast biopsy      FAMILY HISTORY: Family History  Problem Relation Age  of Onset  . Stroke Mother   . Hypertension Mother   . COPD Mother   . Thyroid nodules Mother   . Hyperlipidemia Mother   . Heart disease Father   . Hypertension Father   . Hyperlipidemia Father   . Hypertension Maternal Grandmother   . Hyperlipidemia Maternal Grandmother   . Diabetes Maternal Grandmother   . Thyroid nodules Maternal Grandmother   . Hypertension Maternal Grandfather   . Hyperlipidemia Maternal Grandfather   . Prostate cancer Maternal Grandfather   . Hypertension Paternal Grandmother   . Heart disease Paternal Grandfather   . Hypertension Paternal Grandfather     SOCIAL HISTORY: History   Social History  . Marital Status: Single    Spouse Name: N/A    Number of Children: 0  . Years of Education: Grad   Occupational History  .  [employer] History Main Topics  . Smoking status: Never Smoker   . Smokeless tobacco: Never Used  . Alcohol Use: Yes     Comment: occassionally   . Drug Use: No  . Sexual Activity: Yes    Birth Control/ Protection: Pill     Comment: yasmin    Other Topics Concern  . Not on file   Social History Narrative   Patient is single, has 0 children   Patient is right handed   Education level is grad school   Caffeine consumption is 1 cup daily     PHYSICAL EXAM  There were no vitals filed for this visit. There is no weight on file to calculate BMI.  Generalized: Well developed, obese female in no acute distress  Head: normocephalic and atraumatic,. Oropharynx benign, moderate amount cerumen bil Neurological examination   Mentation: Alert oriented to time, place, history taking. Follows all commands speech and language fluent  Cranial nerve II-XII: Pupils  were equal round reactive to light extraocular movements were full, visual field were full on confrontational test. Facial sensation and strength were normal. hearing was intact to finger rubbing bilaterally. Uvula tongue midline. head turning and shoulder  shrug were normal and symmetric.Tongue protrusion into cheek strength was normal. Motor: normal bulk and tone, full strength in the BUE, BLE, fine finger movements normal, no pronator drift. No focal weakness Sensory: normal and symmetric to light touch, pinprick, and  vibration  Coordination: finger-nose-finger, heel-to-shin bilaterally, no dysmetria Reflexes: Brachioradialis 2/2, biceps 2/2, triceps 2/2, patellar 2/2, Achilles 2/2, plantar responses were flexor bilaterally. Gait and Station: Rising up from seated position without assistance, normal stance,  moderate stride, good arm swing, smooth turning, able to perform tiptoe, and heel walking without difficulty. Tandem gait is steady  DIAGNOSTIC DATA (LABS, IMAGING, TESTING) - I reviewed patient records, labs, notes, testing and imaging myself where available.  Lab Results  Component Value  Date   WBC 7.9 12/12/2012   HGB 12.9 12/12/2012   HCT 38.1 12/12/2012   MCV 87.4 12/12/2012   PLT 191 12/12/2012      Component Value Date/Time   NA 135 12/12/2012 1407   K 3.8 12/12/2012 1407   CL 101 12/12/2012 1407   CO2 23 12/12/2012 1407   GLUCOSE 77 12/12/2012 1407   BUN 11 12/12/2012 1407   CREATININE 0.70 12/12/2012 1407   CALCIUM 9.5 12/12/2012 1407   GFRNONAA >90 12/12/2012 1407   GFRAA >90 12/12/2012 1407       ASSESSMENT AND PLAN  44 y.o. year old female  has a past medical history of right foot left foot paresthesias in episodes of dizziness. MRI of the brain normal EMG nerve conduction without evidence of large fiber peripheral neuropathy. Her migraines are treated by Dr. Melton Alar.   Her right foot discomfort is most consistent with right foot musculoskeletal etiology, I have suggested that further evaluation by podiatrist. Migraine headaches, Maxalt as needed, increase Topamax to 50 mg in the morning, 100 mg at night Return to clinic in 3 4 months  No orders of the defined types were placed in this encounter.    New  Prescriptions   RIZATRIPTAN (MAXALT-MLT) 10 MG DISINTEGRATING TABLET    Take 1 tablet (10 mg total) by mouth as needed for migraine. May repeat in 2 hours if needed    Medications Discontinued During This Encounter  Medication Reason  . ZARAH 3-0.03 MG tablet Therapy completed  . topiramate (TOPAMAX) 50 MG tablet Reorder    Return in about 4 months (around 02/25/2015).   Marcial Pacas, M.D. Ph.D.  [facility] [place], [place] [address] Phone: [phone] Fax:      [phone]

## 2015-01-06 ENCOUNTER — Other Ambulatory Visit: Payer: Self-pay

## 2015-01-06 DIAGNOSIS — Z1231 Encounter for screening mammogram for malignant neoplasm of breast: Secondary | ICD-10-CM

## 2015-01-15 ENCOUNTER — Ambulatory Visit
Admission: RE | Admit: 2015-01-15 | Discharge: 2015-01-15 | Disposition: A | Discharge status: Home / Self Care | Payer: 59 | Source: Ambulatory Visit

## 2015-01-15 DIAGNOSIS — Z1231 Encounter for screening mammogram for malignant neoplasm of breast: Secondary | ICD-10-CM

## 2015-02-03 ENCOUNTER — Telehealth: Payer: Self-pay

## 2015-02-03 NOTE — Telephone Encounter (Signed)
Called and spoke to patient's mother and she will have patient call to reschedule her apt. With Dr.Yan. Patient was scheduled 03/04/15. Please when patient calls back she will need to to be rescheduled in 15 minute slot. Thanks Hinton Dyer.

## 2015-03-04 ENCOUNTER — Ambulatory Visit: Payer: 59 | Admitting: Neurology

## 2015-03-08 ENCOUNTER — Ambulatory Visit: Payer: 59 | Admitting: Neurology

## 2015-03-08 ENCOUNTER — Telehealth: Payer: Self-pay | Admitting: *Deleted

## 2015-03-08 DIAGNOSIS — Z0289 Encounter for other administrative examinations: Secondary | ICD-10-CM

## 2015-03-08 NOTE — Telephone Encounter (Signed)
No showed follow up appt. 

## 2015-03-10 ENCOUNTER — Ambulatory Visit (INDEPENDENT_AMBULATORY_CARE_PROVIDER_SITE_OTHER): Payer: 59 | Admitting: Neurology

## 2015-03-10 ENCOUNTER — Encounter: Payer: Self-pay | Admitting: Neurology

## 2015-03-10 VITALS — BP 151/93 | HR 86 | Ht 64.0 in | Wt 246.0 lb

## 2015-03-10 DIAGNOSIS — R209 Unspecified disturbances of skin sensation: Secondary | ICD-10-CM | POA: Diagnosis not present

## 2015-03-10 DIAGNOSIS — G43009 Migraine without aura, not intractable, without status migrainosus: Secondary | ICD-10-CM | POA: Diagnosis not present

## 2015-03-10 DIAGNOSIS — M79671 Pain in right foot: Secondary | ICD-10-CM | POA: Diagnosis not present

## 2015-03-10 DIAGNOSIS — R42 Dizziness and giddiness: Secondary | ICD-10-CM

## 2015-03-10 NOTE — Progress Notes (Signed)
Chief Complaint  Patient presents with  . Migraine    Reports getting at least one mild to moderate migraine weekly.    . Numbness    She is still getting intermittent numbness in bilateral hands and right foot.  Also, concerned about swelling in her hands.     [facility]  PATIENT: Jaclyn Haynes DOB: 1971-05-05  HISTORY OF PRESENT ILLNESS: Jaclyn Haynes is a 44 years old female, follow-up for migraine, right foot discomfort  She had past medical history of migraine headache, over the past 6 months, without triggering events, she noticed intermittent right foot numbness, pins needle, loss of feeling at her right foot, which can last minutes to hours, initially this happen on a daily basis, over the past few months,  it is getting better, only couple times a week now, she had this occasionally similar symptoms involving her left foot  In addition she has episode of fingertips numbness tingling, sometimes surge of pain in her right leg, she has chronic low back pain but stay at that midline, there was no shooting pain to her lower extremity  She denies gait difficulty, she denies incontinence, no visual loss, over the past few months, she also he noticed intermittent dizziness, she felt off-balance when she got up in the morning, or turn quickly.  10/02/12: Patient returns for followup. Continues with right foot numbness, pins needle, loss of feeling at her right foot, she also has episode of fingertips numbness tingling although symptoms have improved.  MRI of the brain was normal. EMG/[place] without evidence of large fiber peripheral neuropathy or right lumbosacral radiculopathy.   UPDATE Feb 2nd 2016: Last visit was in Feb 2015, she still has intermittent right hand swelling, right foot paresthesia, she has a lot of pain on right lateral foot, especially when bearing weight, difficulty sleeping,  She is now taking Topamax 50 mg twice a day, does has side effect of excessive  drowsiness, she also has intermittent lightheaded, dizziness sensation, with no significant headaches, has been taking over-the-counter meclizine  UPDATE June 15th 2016: Her migraine headaches overall has much improved, no longer has severe headaches, but she still has about once a week, right parietal mild pain, occipital area pain, pressure, lasting for one day, Maxalt prn works well for her, she is now taking topamax 25mg  3 tabs qhs, she could not tolerate higher dose of Topamax, complains of worsening bilateral fingertips paresthesia,   She continue have right lateral foot pain, mild swelling, shooting pain after bearing weight   REVIEW OF SYSTEMS: Full 14 system review of systems performed and notable only for those listed, all others are neg:     ALLERGIES: No Known Allergies  HOME MEDICATIONS: Outpatient Prescriptions Prior to Visit  Medication Sig Dispense Refill  . albuterol (PROVENTIL HFA;VENTOLIN HFA) 108 (90 BASE) MCG/ACT inhaler Inhale 2 puffs into the lungs every 6 (six) hours as needed for wheezing.    Marland Kitchen AMETHIA 0.15-0.03 &0.01 MG tablet     . Calcium Carbonate (CALCIUM 600 PO) Take 1 tablet by mouth daily.     . Cetirizine HCl (ZYRTEC PO) Take 1 tablet by mouth daily.     . Esomeprazole Magnesium (NEXIUM PO) Take by mouth.    . Fluticasone-Salmeterol (ADVAIR) 250-50 MCG/DOSE AEPB Inhale 1 puff into the lungs every 12 (twelve) hours.    . Glucosamine-Chondroitin (GLUCOSAMINE CHONDR COMPLEX PO) Take by mouth.    Marland Kitchen ibuprofen (ADVIL,MOTRIN) 800 MG tablet Take 1 tablet (800 mg total) by  mouth every 8 (eight) hours as needed for pain. 30 tablet 0  . levocetirizine (XYZAL) 5 MG tablet     . Meclizine HCl (ANTIVERT PO) Take by mouth.    . meloxicam (MOBIC) 7.5 MG tablet Take 7.5 mg by mouth daily as needed for pain.    . Misc Natural Products (GLUCOSAMINE CHOND COMPLEX/MSM PO) Take by mouth.    . Multiple Vitamin (MULTIVITAMIN) tablet Take 1 tablet by mouth daily.    Marland Kitchen NAPROXEN  DR 500 MG EC tablet     . Naratriptan HCl (AMERGE PO) Take by mouth.    . Probiotic Product (PROBIOTIC DAILY PO) Take by mouth.    . Ranitidine HCl (ZANTAC PO) Take 1 tablet by mouth 2 (two) times daily.     . rizatriptan (MAXALT-MLT) 10 MG disintegrating tablet Take 1 tablet (10 mg total) by mouth as needed for migraine. May repeat in 2 hours if needed 12 tablet 11  . topiramate (TOPAMAX) 50 MG tablet One tab po qam and 2 tabs po qhs 90 tablet 11  . valACYclovir (VALTREX) 500 MG tablet     . mometasone (NASONEX) 50 MCG/ACT nasal spray Place 2 sprays into the nose daily.     No facility-administered medications prior to visit.    PAST MEDICAL HISTORY: Past Medical History  Diagnosis Date  . Yeast infection   . Chlamydia   . BV (bacterial vaginosis)   . HSV-2 infection   . Fibroids   . Abnormal Pap smear     CIN1  . Hernia   . Bladder infection     PAST SURGICAL HISTORY: Past Surgical History  Procedure Laterality Date  . Breast biopsy      FAMILY HISTORY: Family History  Problem Relation Age of Onset  . Stroke Mother   . Hypertension Mother   . COPD Mother   . Thyroid nodules Mother   . Hyperlipidemia Mother   . Heart disease Father   . Hypertension Father   . Hyperlipidemia Father   . Hypertension Maternal Grandmother   . Hyperlipidemia Maternal Grandmother   . Diabetes Maternal Grandmother   . Thyroid nodules Maternal Grandmother   . Hypertension Maternal Grandfather   . Hyperlipidemia Maternal Grandfather   . Prostate cancer Maternal Grandfather   . Hypertension Paternal Grandmother   . Heart disease Paternal Grandfather   . Hypertension Paternal Grandfather     SOCIAL HISTORY: History   Social History  . Marital Status: Single    Spouse Name: N/A  . Number of Children: 0  . Years of Education: Grad   Occupational History  .  [place] History Main Topics  . Smoking status: Never Smoker   . Smokeless tobacco: Never Used  .  Alcohol Use: Yes     Comment: occassionally   . Drug Use: No  . Sexual Activity: Yes    Birth Control/ Protection: Pill     Comment: yasmin    Other Topics Concern  . Not on file   Social History Narrative   Patient is single, has 0 children   Patient is right handed   Education level is grad school   Caffeine consumption is 1 cup daily     PHYSICAL EXAM  Filed Vitals:   03/10/15 0940  BP: 151/93  Pulse: 86  Height: 5\' 4"  (1.626 m)  Weight: 246 lb (111.585 kg)   Body mass index is 42.21 kg/(m^2). PHYSICAL EXAMNIATION:  Gen: NAD, conversant, well nourised, obese, well  groomed                     Cardiovascular: Regular rate rhythm, no peripheral edema, warm, nontender. Eyes: Conjunctivae clear without exudates or hemorrhage Neck: Supple, no carotid bruise. Pulmonary: Clear to auscultation bilaterally Musculoskeletal: Right lateral foot tenderness upon deep palpitation   NEUROLOGICAL EXAM:  MENTAL STATUS: Speech:    Speech is normal; fluent and spontaneous with normal comprehension.  Cognition:    The patient is oriented to person, place, and time;     recent and remote memory intact;     language fluent;     normal attention, concentration,     fund of knowledge.  CRANIAL NERVES: CN II: Visual fields are full to confrontation. Funduscopy examination were normal, pupil equal round reactive to light CN III, IV, VI: extraocular movement are normal. No ptosis. CN V: Facial sensation is intact to pinprick in all 3 divisions bilaterally. Corneal responses are intact.  CN VII: Face is symmetric with normal eye closure and smile. CN VIII: Hearing is normal to rubbing fingers CN IX, X: Palate elevates symmetrically. Phonation is normal. CN XI: Head turning and shoulder shrug are intact CN XII: Tongue is midline with normal movements and no atrophy.  MOTOR: There is no pronator drift of out-stretched arms. Muscle bulk and tone are normal. Muscle strength is  normal.  REFLEXES: Reflexes are 2+ and symmetric at the biceps, triceps, knees, and ankles. Plantar responses are flexor.  SENSORY: Light touch, pinprick, position sense, and vibration sense are intact in fingers and toes.  COORDINATION: Rapid alternating movements and fine finger movements are intact. There is no dysmetria on finger-to-nose and heel-knee-shin. There are no abnormal or extraneous movements.   GAIT/STANCE: Posture is normal. Gait is steady with normal steps, base, arm swing, and turning. Heel and toe walking are normal. Tandem gait is normal.  Romberg is absent.   DIAGNOSTIC DATA (LABS, IMAGING, TESTING) - I reviewed patient records, labs, notes, testing and imaging myself where available.  Lab Results  Component Value Date   WBC 7.9 12/12/2012   HGB 12.9 12/12/2012   HCT 38.1 12/12/2012   MCV 87.4 12/12/2012   PLT 191 12/12/2012      Component Value Date/Time   NA 135 12/12/2012 1407   K 3.8 12/12/2012 1407   CL 101 12/12/2012 1407   CO2 23 12/12/2012 1407   GLUCOSE 77 12/12/2012 1407   BUN 11 12/12/2012 1407   CREATININE 0.70 12/12/2012 1407   CALCIUM 9.5 12/12/2012 1407   GFRNONAA >90 12/12/2012 1407   GFRAA >90 12/12/2012 1407   ASSESSMENT AND PLAN  44 y.o. year old female   1. Her right foot discomfort is most consistent with right foot musculoskeletal etiology, hot compression, when necessary NSAIDs, I have suggested that further evaluation by orthopedic 2. Migraine headaches, has much improved, she is now taking Topamax 25 mg 3 tablets every night, I have encouraged her slowly titrating up the dosage, maximum 100 mg twice a day, previously she has trouble tolerating higher dosage, numbness tingling, Maxalt as needed,  3. Return to clinic in 6 months with nurse practitioner   Marcial Pacas, M.D. Ph.D.  [facility] [place], [place] [address] Phone: [phone] Fax:      [phone]

## 2015-08-04 DIAGNOSIS — R002 Palpitations: Secondary | ICD-10-CM | POA: Insufficient documentation

## 2015-08-06 ENCOUNTER — Ambulatory Visit (INDEPENDENT_AMBULATORY_CARE_PROVIDER_SITE_OTHER): Payer: 59

## 2015-08-06 DIAGNOSIS — R002 Palpitations: Secondary | ICD-10-CM

## 2015-11-02 ENCOUNTER — Other Ambulatory Visit: Payer: Self-pay | Admitting: Obstetrics and Gynecology

## 2016-03-10 ENCOUNTER — Ambulatory Visit (INDEPENDENT_AMBULATORY_CARE_PROVIDER_SITE_OTHER): Payer: 59 | Admitting: Internal Medicine

## 2016-03-10 VITALS — BP 130/88 | HR 75 | Temp 99.1°F | Resp 16 | Ht 64.0 in | Wt 239.8 lb

## 2016-03-10 DIAGNOSIS — R21 Rash and other nonspecific skin eruption: Secondary | ICD-10-CM

## 2016-03-10 MED ORDER — TRIAMCINOLONE ACETONIDE 0.1 % EX CREA: 1.0000 "application " | TOPICAL_CREAM | Freq: Two times a day (BID) | CUTANEOUS | Status: DC

## 2016-03-10 MED ORDER — VALACYCLOVIR HCL 1 G PO TABS: 1000.0000 mg | ORAL_TABLET | Freq: Three times a day (TID) | ORAL | Status: DC

## 2016-03-10 NOTE — Progress Notes (Signed)
Subjective:  By signing my name below, I, Jaclyn Haynes, attest that this documentation has been prepared under the direction and in the presence of Leandrew Koyanagi, MD Electronically Signed: Ladene Artist, ED Scribe 03/10/2016 at 3:16 PM.   Patient ID: Jaclyn Haynes, female    DOB: 12/19/1970, 45 y.o.   MRN: [id number]  Chief Complaint  Patient presents with  . Insect Bite    Yesterday. Lower back.    HPI HPI Comments: Jaclyn Haynes is a 45 y.o. female who presents to the [facility] complaining of an unknown insect bite to the lower back sustained yesterday while in [place]. Pt reports a constant, throbbing and pruritic sensation surrounding the area that is exacerbated with palpation. No treatments tried PTA. She denies fever or rash to any other area of her body.   Past Medical History  Diagnosis Date  . Yeast infection   . Chlamydia   . BV (bacterial vaginosis)   . HSV-2 infection   . Fibroids   . Abnormal Pap smear     CIN1  . Hernia   . Bladder infection   . Heart murmur   . Allergy   . Anemia   . Arthritis    Current Outpatient Prescriptions on File Prior to Visit  Medication Sig Dispense Refill  . albuterol (PROVENTIL HFA;VENTOLIN HFA) 108 (90 BASE) MCG/ACT inhaler Inhale 2 puffs into the lungs every 6 (six) hours as needed for wheezing.    Marland Kitchen AMETHIA 0.15-0.03 &0.01 MG tablet     . Calcium Carbonate (CALCIUM 600 PO) Take 1 tablet by mouth daily.     . Cetirizine HCl (ZYRTEC PO) Take 1 tablet by mouth daily.     . Esomeprazole Magnesium (NEXIUM PO) Take by mouth.    . fluticasone (FLONASE) 50 MCG/ACT nasal spray SHAKE WELL AND U 2 SPRAYS IEN QD PRN  6  . Fluticasone-Salmeterol (ADVAIR) 250-50 MCG/DOSE AEPB Inhale 1 puff into the lungs every 12 (twelve) hours.    . Glucosamine-Chondroitin (GLUCOSAMINE CHONDR COMPLEX PO) Take by mouth.    Marland Kitchen ibuprofen (ADVIL,MOTRIN) 800 MG tablet Take 1 tablet (800 mg total) by mouth every 8 (eight) hours  as needed for pain. 30 tablet 0  . levocetirizine (XYZAL) 5 MG tablet     . Meclizine HCl (ANTIVERT PO) Take by mouth.    . meloxicam (MOBIC) 7.5 MG tablet Take 7.5 mg by mouth daily as needed for pain.    . Misc Natural Products (GLUCOSAMINE CHOND COMPLEX/MSM PO) Take by mouth.    . Multiple Vitamin (MULTIVITAMIN) tablet Take 1 tablet by mouth daily.    . Naratriptan HCl (AMERGE PO) Take by mouth.    Marland Kitchen omeprazole (PRILOSEC) 40 MG capsule Take 40 mg by mouth daily.    . Probiotic Product (PROBIOTIC DAILY PO) Take by mouth.    . Ranitidine HCl (ZANTAC PO) Take 1 tablet by mouth 2 (two) times daily.     Marland Kitchen topiramate (TOPAMAX) 50 MG tablet One tab po qam and 2 tabs po qhs 90 tablet 11  . valACYclovir (VALTREX) 500 MG tablet      No current facility-administered medications on file prior to visit.   No Known Allergies  Review of Systems  Constitutional: Negative for fever.  Skin: Positive for rash.   BP 130/88 mmHg  Pulse 75  Temp(Src) 99.1 F (37.3 C) (Oral)  Resp 16  Ht 5\' 4"  (1.626 m)  Wt 239 lb 12.8 oz (108.773 kg)  BMI 41.14 kg/m2  SpO2 98%  LMP 12/25/2015    Objective:   Physical Exam  Constitutional: She is oriented to person, place, and time. She appears well-developed and well-nourished. No distress.  HENT:  Head: Normocephalic and atraumatic.  Eyes: Conjunctivae are normal. Pupils are equal, round, and reactive to light.  Neck: Neck supple.  Cardiovascular: Normal rate.   Pulmonary/Chest: Effort normal.  Musculoskeletal: Normal range of motion.  Neurological: She is alert and oriented to person, place, and time.  Skin: Skin is warm and dry. Rash noted. Rash is papular.  Over the L5 area is a cluster of 5 erythematous papules with slight vesiculation. This area is very tender to touch but there is no hypesthesia in the dermatome. No sign of infection.   Psychiatric: She has a normal mood and affect. Her behavior is normal.  Nursing note and vitals reviewed.       Assessment & Plan:   Rash and nonspecific skin eruption shingles vs spider bites  Meds ordered this encounter  Medications  . valACYclovir (VALTREX) 1000 MG tablet    Sig: Take 1 tablet (1,000 mg total) by mouth 3 (three) times daily.    Dispense:  21 tablet    Refill:  0  . triamcinolone cream (KENALOG) 0.1 %    Sig: Apply 1 application topically 2 (two) times daily. As needed for itching    Dispense:  30 g    Refill:  0   I have completed the patient encounter in its entirety as documented by the scribe, with editing by me where necessary. Lora Glomski P. Laney Pastor, M.D.

## 2016-03-10 NOTE — Patient Instructions (Signed)
     IF you received an x-ray today, you will receive an invoice from [facility]. Please contact  Radiology at [phone] with questions or concerns regarding your invoice.   IF you received labwork today, you will receive an invoice from [facility]. Please contact [facility] at [phone] with questions or concerns regarding your invoice.   Our billing staff will not be able to assist you with questions regarding bills from these companies.  You will be contacted with the lab results as soon as they are available. The fastest way to get your results is to activate your My Chart account. Instructions are located on the last page of this paperwork. If you have not heard from us regarding the results in 2 weeks, please contact this office.      

## 2016-03-20 ENCOUNTER — Other Ambulatory Visit: Payer: Self-pay | Admitting: Physician Assistant

## 2016-03-20 ENCOUNTER — Ambulatory Visit
Admission: RE | Admit: 2016-03-20 | Discharge: 2016-03-20 | Disposition: A | Discharge status: Home / Self Care | Payer: 59 | Source: Ambulatory Visit | Attending: Physician Assistant | Admitting: Physician Assistant

## 2016-03-20 DIAGNOSIS — N200 Calculus of kidney: Secondary | ICD-10-CM

## 2016-05-21 ENCOUNTER — Encounter (HOSPITAL_COMMUNITY): Payer: Self-pay

## 2016-05-21 ENCOUNTER — Observation Stay (HOSPITAL_COMMUNITY)
Admission: EM | Admit: 2016-05-21 | Discharge: 2016-05-24 | Disposition: A | Discharge status: Home / Self Care | Payer: 59 | Attending: Internal Medicine | Admitting: Internal Medicine

## 2016-05-21 DIAGNOSIS — Z7951 Long term (current) use of inhaled steroids: Secondary | ICD-10-CM | POA: Diagnosis not present

## 2016-05-21 DIAGNOSIS — T7840XA Allergy, unspecified, initial encounter: Secondary | ICD-10-CM | POA: Diagnosis present

## 2016-05-21 DIAGNOSIS — Z79891 Long term (current) use of opiate analgesic: Secondary | ICD-10-CM | POA: Diagnosis not present

## 2016-05-21 DIAGNOSIS — T783XXA Angioneurotic edema, initial encounter: Secondary | ICD-10-CM | POA: Diagnosis not present

## 2016-05-21 DIAGNOSIS — R002 Palpitations: Secondary | ICD-10-CM | POA: Diagnosis not present

## 2016-05-21 DIAGNOSIS — J45909 Unspecified asthma, uncomplicated: Secondary | ICD-10-CM | POA: Diagnosis not present

## 2016-05-21 DIAGNOSIS — R29810 Facial weakness: Secondary | ICD-10-CM | POA: Diagnosis present

## 2016-05-21 DIAGNOSIS — Z79899 Other long term (current) drug therapy: Secondary | ICD-10-CM | POA: Diagnosis not present

## 2016-05-21 DIAGNOSIS — G43009 Migraine without aura, not intractable, without status migrainosus: Secondary | ICD-10-CM | POA: Diagnosis present

## 2016-05-21 LAB — CBC WITH DIFFERENTIAL/PLATELET
BASOS PCT: 0 %
Basophils Absolute: 0 10*3/uL (ref 0.0–0.1)
Eosinophils Absolute: 0.1 10*3/uL (ref 0.0–0.7)
Eosinophils Relative: 1 %
HEMATOCRIT: 41.6 % (ref 36.0–46.0)
HEMOGLOBIN: 13.7 g/dL (ref 12.0–15.0)
LYMPHS ABS: 1.8 10*3/uL (ref 0.7–4.0)
LYMPHS PCT: 17 %
MCH: 28.7 pg (ref 26.0–34.0)
MCHC: 32.9 g/dL (ref 30.0–36.0)
MCV: 87.2 fL (ref 78.0–100.0)
MONOS PCT: 3 %
Monocytes Absolute: 0.3 10*3/uL (ref 0.1–1.0)
NEUTROS ABS: 8.8 10*3/uL — AB (ref 1.7–7.7)
NEUTROS PCT: 79 %
Platelets: 308 10*3/uL (ref 150–400)
RBC: 4.77 MIL/uL (ref 3.87–5.11)
RDW: 13.8 % (ref 11.5–15.5)
WBC: 11 10*3/uL — ABNORMAL HIGH (ref 4.0–10.5)

## 2016-05-21 LAB — I-STAT CHEM 8, ED
BUN: 5 mg/dL — AB (ref 6–20)
CREATININE: 0.6 mg/dL (ref 0.44–1.00)
Calcium, Ion: 1.15 mmol/L (ref 1.13–1.30)
Chloride: 102 mmol/L (ref 101–111)
GLUCOSE: 99 mg/dL (ref 65–99)
HEMATOCRIT: 46 % (ref 36.0–46.0)
Hemoglobin: 15.6 g/dL — ABNORMAL HIGH (ref 12.0–15.0)
Potassium: 3.7 mmol/L (ref 3.5–5.1)
Sodium: 140 mmol/L (ref 135–145)
TCO2: 24 mmol/L (ref 0–100)

## 2016-05-21 MED ORDER — FERROUS SULFATE 325 (65 FE) MG PO TABS
325.0000 mg | ORAL_TABLET | Freq: Every day | ORAL | Status: DC
  Administered 2016-05-22 – 2016-05-23 (×2): 325 mg via ORAL
  Filled 2016-05-21 (×2): qty 1

## 2016-05-21 MED ORDER — FAMOTIDINE IN NACL 20-0.9 MG/50ML-% IV SOLN
20.0000 mg | Freq: Two times a day (BID) | INTRAVENOUS | Status: DC
  Administered 2016-05-21 – 2016-05-22 (×3): 20 mg via INTRAVENOUS
  Filled 2016-05-21 (×3): qty 50

## 2016-05-21 MED ORDER — DIPHENHYDRAMINE HCL 50 MG/ML IJ SOLN
25.0000 mg | Freq: Once | INTRAMUSCULAR | Status: AC
  Administered 2016-05-21: 25 mg via INTRAVENOUS
  Filled 2016-05-21: qty 1

## 2016-05-21 MED ORDER — ACETAMINOPHEN 500 MG PO TABS: 1000.0000 mg | ORAL_TABLET | Freq: Four times a day (QID) | ORAL | Status: DC | PRN

## 2016-05-21 MED ORDER — ALBUTEROL SULFATE (2.5 MG/3ML) 0.083% IN NEBU: 3.0000 mL | INHALATION_SOLUTION | Freq: Four times a day (QID) | RESPIRATORY_TRACT | Status: DC | PRN

## 2016-05-21 MED ORDER — DIPHENHYDRAMINE HCL 50 MG/ML IJ SOLN
12.5000 mg | Freq: Four times a day (QID) | INTRAMUSCULAR | Status: DC | PRN
  Administered 2016-05-21 – 2016-05-22 (×2): 12.5 mg via INTRAVENOUS
  Filled 2016-05-21 (×2): qty 1

## 2016-05-21 MED ORDER — METOPROLOL SUCCINATE ER 25 MG PO TB24
25.0000 mg | ORAL_TABLET | Freq: Every day | ORAL | Status: DC
  Administered 2016-05-22: 25 mg via ORAL
  Filled 2016-05-21: qty 1

## 2016-05-21 MED ORDER — MOMETASONE FURO-FORMOTEROL FUM 200-5 MCG/ACT IN AERO
2.0000 | INHALATION_SPRAY | Freq: Two times a day (BID) | RESPIRATORY_TRACT | Status: DC
  Administered 2016-05-21 – 2016-05-23 (×5): 2 via RESPIRATORY_TRACT
  Filled 2016-05-21: qty 8.8

## 2016-05-21 MED ORDER — SODIUM CHLORIDE 0.9 % IV BOLUS (SEPSIS)
1000.0000 mL | Freq: Once | INTRAVENOUS | Status: AC
  Administered 2016-05-21: 1000 mL via INTRAVENOUS

## 2016-05-21 MED ORDER — PREDNISONE 20 MG PO TABS
60.0000 mg | ORAL_TABLET | Freq: Once | ORAL | Status: AC
  Administered 2016-05-21: 60 mg via ORAL
  Filled 2016-05-21: qty 3

## 2016-05-21 MED ORDER — DEXAMETHASONE SODIUM PHOSPHATE 10 MG/ML IJ SOLN
10.0000 mg | Freq: Once | INTRAMUSCULAR | Status: AC
  Administered 2016-05-21: 10 mg via INTRAVENOUS
  Filled 2016-05-21: qty 1

## 2016-05-21 MED ORDER — METHYLPREDNISOLONE SODIUM SUCC 125 MG IJ SOLR
60.0000 mg | Freq: Two times a day (BID) | INTRAMUSCULAR | Status: DC
Start: 2016-05-21 — End: 2016-05-24
  Administered 2016-05-21 – 2016-05-24 (×6): 60 mg via INTRAVENOUS
  Filled 2016-05-21 (×6): qty 2

## 2016-05-21 NOTE — H&P (Signed)
History and Physical    Jaclyn Haynes [id number] DOB: 1971-03-03 DOA: 05/21/2016  PCP: REDMON,NOELLE, PA-C  Patient coming from: home   Chief Complaint: lip numbness  HPI: Jaclyn Haynes is a 45 y.o. female with medical history significant of asthma, allergies but otherwise healthy, presents to the emergency room with complaints of right facial numbness and right facial droop. Patient was in her normal state of health this morning, she was going about her morning routine when she noticed that her right side of her face was numb, and felt like her lips on the right side was drooping down, and decided to come to the emergency room. She never had symptoms like this before. She has no fever or chills, she denies any chest pain, she denies any shortness of breath, she has no headaches, she has no abdominal pain, nausea, vomiting or diarrhea. While in the ED, she was about to get an MRI to be ruled out for a TIA, when her upper lip started swelling, suggesting angioedema. Patient denies any new medications, she denies any new food, she denies any new detergents/perfumes/soap. She went to a birthday party yesterday, but other than that nothing out of the ordinary.   With angioedema present, her MRI was canceled, she was given prednisone, Pepcid as well as Benadryl. She was observed and it felt like the swelling was not coming down and in fact slightly progressing, and [facility] was asked for admission for observation.  Review of Systems: As per HPI otherwise 10 point review of systems negative.   Past Medical History:  Diagnosis Date  . Abnormal Pap smear    CIN1  . Allergy   . Anemia   . Arthritis   . Bladder infection   . BV (bacterial vaginosis)   . Chlamydia   . Fibroids   . Heart murmur   . Hernia   . HSV-2 infection   . Yeast infection     Past Surgical History:  Procedure Laterality Date  . BREAST BIOPSY       reports that she has never smoked. She has never used smokeless  tobacco. She reports that she does not drink alcohol or use drugs.  Allergies  Allergen Reactions  . Bee Venom Hives and Swelling    Swelling , more than localized  . Peanut-Containing Drug Products Hives  . Tomato Hives    Only raw tomato causes reaction    Family History  Problem Relation Age of Onset  . Stroke Mother   . Hypertension Mother   . COPD Mother   . Thyroid nodules Mother   . Hyperlipidemia Mother   . Heart disease Father   . Hypertension Father   . Hyperlipidemia Father   . Hypertension Maternal Grandmother   . Hyperlipidemia Maternal Grandmother   . Diabetes Maternal Grandmother   . Thyroid nodules Maternal Grandmother   . Hypertension Maternal Grandfather   . Hyperlipidemia Maternal Grandfather   . Prostate cancer Maternal Grandfather   . Hypertension Paternal Grandmother   . Heart disease Paternal Grandfather   . Hypertension Paternal Grandfather     Prior to Admission medications   Medication Sig Start Date End Date Taking? Authorizing Provider  acetaminophen (TYLENOL) 500 MG tablet Take 1,000 mg by mouth every 6 (six) hours as needed for moderate pain.   Yes Historical Provider, MD  albuterol (PROVENTIL HFA;VENTOLIN HFA) 108 (90 BASE) MCG/ACT inhaler Inhale 2 puffs into the lungs every 6 (six) hours as needed for wheezing.  Yes Historical Provider, MD  AMETHIA 0.15-0.03 &0.01 MG tablet Take 1 tablet by mouth daily.  10/08/13  Yes Historical Provider, MD  Calcium Carbonate (CALCIUM 600 PO) Take 1 tablet by mouth daily.    Yes Historical Provider, MD  ferrous sulfate 325 (65 FE) MG tablet Take 325 mg by mouth daily with breakfast.   Yes Historical Provider, MD  fluticasone (FLONASE) 50 MCG/ACT nasal spray SHAKE WELL AND U 2 SPRAYS IEN QD PRN for sinus drainage or stuffiness 12/21/14  Yes Historical Provider, MD  Fluticasone-Salmeterol (ADVAIR) 250-50 MCG/DOSE AEPB Inhale 1 puff into the lungs daily.    Yes Historical Provider, MD  Glucosamine-Chondroitin  (GLUCOSAMINE CHONDR COMPLEX PO) Take 1 tablet by mouth daily.    Yes Historical Provider, MD  levocetirizine (XYZAL) 5 MG tablet Take 5 mg by mouth every evening.  10/23/13  Yes Historical Provider, MD  meloxicam (MOBIC) 7.5 MG tablet Take 7.5 mg by mouth daily as needed for pain.   Yes Historical Provider, MD  metoprolol succinate (TOPROL-XL) 25 MG 24 hr tablet TAKE 1 TABLET BY MOUTH ONCE DAILY FOR PALPITATION 05/03/16  Yes Historical Provider, MD  Misc Natural Products (GLUCOSAMINE CHOND COMPLEX/MSM PO) Take by mouth.   Yes Historical Provider, MD  montelukast (SINGULAIR) 10 MG tablet TK 1 T PO QD IN THE EVENING 05/13/16  Yes Historical Provider, MD  Multiple Vitamin (MULTIVITAMIN) tablet Take 1 tablet by mouth daily.   Yes Historical Provider, MD  naratriptan (AMERGE) 2.5 MG tablet Take 2.5 mg by mouth daily as needed for migraine (may repeat in 4 hours if there is stilll presence of a migraine). Take one (1) tablet at onset of headache; if returns or does not resolve, may repeat after 4 hours; do not exceed five (5) mg in 24 hours.   Yes Historical Provider, MD  omeprazole (PRILOSEC) 40 MG capsule Take 40 mg by mouth daily as needed (acid reflux).    Yes Historical Provider, MD  Probiotic Product (PROBIOTIC DAILY PO) Take by mouth.   Yes Historical Provider, MD  ranitidine (ZANTAC) 150 MG tablet TK 1 T PO BID FOR ACID REFLUX 04/26/16  Yes Historical Provider, MD  valACYclovir (VALTREX) 500 MG tablet Take 500 mg by mouth daily.   Yes Historical Provider, MD  FLUoxetine (PROZAC) 20 MG tablet Take 20 mg by mouth daily.    Historical Provider, MD  topiramate (TOPAMAX) 50 MG tablet One tab po qam and 2 tabs po qhs Patient not taking: Reported on 05/21/2016 10/27/14   Marcial Pacas, MD    Physical Exam: Vitals:   05/21/16 0540 05/21/16 0829 05/21/16 1045 05/21/16 1315  BP: 142/89 139/85 130/88 132/92  Pulse: 108 85 76 84  Resp: 20 18 15 16   Temp: 99.3 F (37.4 C) 99.3 F (37.4 C)    TempSrc: Oral Oral      SpO2: 98% 97% 98% 97%  Weight: 112.5 kg (248 lb)     Height: 5\' 4"  (1.626 m)         Constitutional: NAD, calm, comfortable Vitals:   05/21/16 0540 05/21/16 0829 05/21/16 1045 05/21/16 1315  BP: 142/89 139/85 130/88 132/92  Pulse: 108 85 76 84  Resp: 20 18 15 16   Temp: 99.3 F (37.4 C) 99.3 F (37.4 C)    TempSrc: Oral Oral    SpO2: 98% 97% 98% 97%  Weight: 112.5 kg (248 lb)     Height: 5\' 4"  (1.626 m)      Eyes: PERRL, lids and conjunctivae  normal ENMT: Mucous membranes are moist. Posterior pharynx clear of any exudate or lesions. Normal dentition. Tongue is not swollen. Upper lip with significant swelling  Neck: normal, supple Respiratory: clear to auscultation bilaterally, no wheezing, no crackles. Normal respiratory effort. No accessory muscle use.  Cardiovascular: Regular rate and rhythm, no murmurs / rubs / gallops. No extremity edema. 2+ pedal pulses.  Abdomen: no tenderness, no masses palpated. Bowel sounds positive.  Musculoskeletal: no clubbing / cyanosis. Normal muscle tone.  Skin: no rashes, lesions, ulcers. No induration Neurologic: Nonfocal, cranial nerves II-12 intact  Psychiatric: Normal judgment and insight. Alert and oriented x 3. Normal mood.   Labs on Admission: I have personally reviewed following labs and imaging studies  CBC:  Recent Labs Lab 05/21/16 1240 05/21/16 1248  WBC 11.0*  --   NEUTROABS 8.8*  --   HGB 13.7 15.6*  HCT 41.6 46.0  MCV 87.2  --   PLT 308  --    Basic Metabolic Panel:  Recent Labs Lab 05/21/16 1248  NA 140  K 3.7  CL 102  GLUCOSE 99  BUN 5*  CREATININE 0.60   GFR: Estimated Creatinine Clearance: 109.1 mL/min (by C-G formula based on SCr of 0.8 mg/dL). Liver Function Tests: No results for input(s): AST, ALT, ALKPHOS, BILITOT, PROT, ALBUMIN in the last 168 hours. No results for input(s): LIPASE, AMYLASE in the last 168 hours. No results for input(s): AMMONIA in the last 168 hours. Coagulation Profile: No  results for input(s): INR, PROTIME in the last 168 hours. Cardiac Enzymes: No results for input(s): CKTOTAL, CKMB, CKMBINDEX, TROPONINI in the last 168 hours. BNP (last 3 results) No results for input(s): PROBNP in the last 8760 hours. HbA1C: No results for input(s): HGBA1C in the last 72 hours. CBG: No results for input(s): GLUCAP in the last 168 hours. Lipid Profile: No results for input(s): CHOL, HDL, LDLCALC, TRIG, CHOLHDL, LDLDIRECT in the last 72 hours. Thyroid Function Tests: No results for input(s): TSH, T4TOTAL, FREET4, T3FREE, THYROIDAB in the last 72 hours. Anemia Panel: No results for input(s): VITAMINB12, FOLATE, FERRITIN, TIBC, IRON, RETICCTPCT in the last 72 hours. Urine analysis: No results found for: COLORURINE, APPEARANCEUR, LABSPEC, PHURINE, GLUCOSEU, HGBUR, BILIRUBINUR, KETONESUR, PROTEINUR, UROBILINOGEN, NITRITE, LEUKOCYTESUR Sepsis Labs: @LABRCNTIP (procalcitonin:4,lacticidven:4) )No results found for this or any previous visit (from the past 240 hour(s)).   Radiological Exams on Admission: No results found.   Assessment/Plan Active Problems:   Migraine without aura and without status migrainosus, not intractable   Palpitation   Angioedema of lips   Allergic reaction   Allergic reaction / angioedema - Patient with significant swelling of the upper lip, however she has no involvement of her tongue, airway appears patent, she has no stridor or breathing difficulties. She was given steroids, Benadryl, famotidine, will observe patient on MedSurg and continue same treatment  - The cause for her allergic reaction is unknown at this time  Asthma - controlled, no wheezing, continue home medications  Hypertension - Continue metoprolol   DVT prophylaxis: early ambulation  Code Status: Full  Family Communication: no family bedside Disposition Plan: admit tomedsurg Consults called: none  Admission status: observation    Marzetta Board, MD [facility] Pager [phone]  If 7PM-7AM, please contact night-coverage [email or web address] Password [email or web address]  05/21/2016, 2:08 PM

## 2016-05-21 NOTE — Progress Notes (Signed)
Attempted to call for report from ED, placed on hold with no response. Will wait for report from ED RN.

## 2016-05-21 NOTE — ED Notes (Signed)
PA at bedside.

## 2016-05-21 NOTE — ED Notes (Signed)
She is awake, alert and comfortable.  She states the right side of her face is "tingly".  I inform her we are awaiting the arrival of MR personnel.

## 2016-05-21 NOTE — ED Provider Notes (Signed)
[facility] DEPT Provider Note   CSN: [id number] Arrival date & time: 05/21/16  0536     History   Chief Complaint Chief Complaint  Patient presents with  . Facial Droop    HPI Raniah R Flaim is a 45 y.o. female.  HPI   45 year old female with history of anemia, heart murmur presenting with complaint of facial droop. Patient states she woke up this morning around 4 AM to get ready for work. She brushes her teeth and shower. Afterward she noticed that the right side of her face was tingling follows by numbness. She felt her face is more swollen and her lips drooping down. Symptoms is throughout the right side of face. She felt that her last normal was approximately 2-3 hours ago. She denies any severe headache, diplopia, difficulty thinking, dental pain, facial pain, chest pain shortness of breath. She has never had this symptom before. She reported 2 weeks ago she was having persistent nasal drainage and cough which has since resolved. Denies any history of cancer. Report remote history of heart murmur. She is not a smoker or drinker.  Past Medical History:  Diagnosis Date  . Abnormal Pap smear    CIN1  . Allergy   . Anemia   . Arthritis   . Bladder infection   . BV (bacterial vaginosis)   . Chlamydia   . Fibroids   . Heart murmur   . Hernia   . HSV-2 infection   . Yeast infection     Patient Active Problem List   Diagnosis Date Noted  . Palpitation 08/04/2015  . Migraine without aura and without status migrainosus, not intractable 10/27/2014  . Dizziness and giddiness 10/27/2013  . Disturbance of skin sensation 10/27/2013    Past Surgical History:  Procedure Laterality Date  . BREAST BIOPSY      OB History    Gravida Para Term Preterm AB Living   0             SAB TAB Ectopic Multiple Live Births                   Home Medications    Prior to Admission medications   Medication Sig Start Date End Date Taking? Authorizing Provider  albuterol  (PROVENTIL HFA;VENTOLIN HFA) 108 (90 BASE) MCG/ACT inhaler Inhale 2 puffs into the lungs every 6 (six) hours as needed for wheezing.    Historical Provider, MD  AMETHIA 0.15-0.03 &0.01 MG tablet  10/08/13   Historical Provider, MD  Calcium Carbonate (CALCIUM 600 PO) Take 1 tablet by mouth daily.     Historical Provider, MD  Cetirizine HCl (ZYRTEC PO) Take 1 tablet by mouth daily.     Historical Provider, MD  Esomeprazole Magnesium (NEXIUM PO) Take by mouth.    Historical Provider, MD  fluticasone (FLONASE) 50 MCG/ACT nasal spray SHAKE WELL AND U 2 SPRAYS IEN QD PRN 12/21/14   Historical Provider, MD  Fluticasone-Salmeterol (ADVAIR) 250-50 MCG/DOSE AEPB Inhale 1 puff into the lungs every 12 (twelve) hours.    Historical Provider, MD  Glucosamine-Chondroitin (GLUCOSAMINE CHONDR COMPLEX PO) Take by mouth.    Historical Provider, MD  ibuprofen (ADVIL,MOTRIN) 800 MG tablet Take 1 tablet (800 mg total) by mouth every 8 (eight) hours as needed for pain. 12/12/12   Calvert Cantor, MD  levocetirizine Harlow Ohms) 5 MG tablet  10/23/13   Historical Provider, MD  Meclizine HCl (ANTIVERT PO) Take by mouth.    Historical Provider, MD  meloxicam (MOBIC) 7.5 MG  tablet Take 7.5 mg by mouth daily as needed for pain.    Historical Provider, MD  Misc Natural Products (GLUCOSAMINE CHOND COMPLEX/MSM PO) Take by mouth.    Historical Provider, MD  Multiple Vitamin (MULTIVITAMIN) tablet Take 1 tablet by mouth daily.    Historical Provider, MD  Naratriptan HCl (AMERGE PO) Take by mouth.    Historical Provider, MD  omeprazole (PRILOSEC) 40 MG capsule Take 40 mg by mouth daily.    Historical Provider, MD  Probiotic Product (PROBIOTIC DAILY PO) Take by mouth.    Historical Provider, MD  Ranitidine HCl (ZANTAC PO) Take 1 tablet by mouth 2 (two) times daily.     Historical Provider, MD  topiramate (TOPAMAX) 50 MG tablet One tab po qam and 2 tabs po qhs 10/27/14   Marcial Pacas, MD  triamcinolone cream (KENALOG) 0.1 % Apply 1 application  topically 2 (two) times daily. As needed for itching 03/10/16   Leandrew Koyanagi, MD  valACYclovir (VALTREX) 1000 MG tablet Take 1 tablet (1,000 mg total) by mouth 3 (three) times daily. 03/10/16   Leandrew Koyanagi, MD    Family History Family History  Problem Relation Age of Onset  . Stroke Mother   . Hypertension Mother   . COPD Mother   . Thyroid nodules Mother   . Hyperlipidemia Mother   . Heart disease Father   . Hypertension Father   . Hyperlipidemia Father   . Hypertension Maternal Grandmother   . Hyperlipidemia Maternal Grandmother   . Diabetes Maternal Grandmother   . Thyroid nodules Maternal Grandmother   . Hypertension Maternal Grandfather   . Hyperlipidemia Maternal Grandfather   . Prostate cancer Maternal Grandfather   . Hypertension Paternal Grandmother   . Heart disease Paternal Grandfather   . Hypertension Paternal Grandfather     Social History Social History  Substance Use Topics  . Smoking status: Never Smoker  . Smokeless tobacco: Never Used  . Alcohol use No     Allergies   Review of patient's allergies indicates no known allergies.   Review of Systems Review of Systems  All other systems reviewed and are negative.    Physical Exam Updated Vital Signs BP 142/89 (BP Location: Left Arm)   Pulse 108   Temp 99.3 F (37.4 C) (Oral)   Resp 20   Ht 5\' 4"  (1.626 m)   Wt 112.5 kg   LMP 01/20/2016 (Approximate)   SpO2 98%   BMI 42.57 kg/m   Physical Exam  Constitutional: She appears well-developed and well-nourished. No distress.  HENT:  Head: Atraumatic.  Right Ear: External ear normal.  Left Ear: External ear normal.  Mouth/Throat: Oropharynx is clear and moist.  TM normal bilaterally.  Eyes: Conjunctivae and EOM are normal. Pupils are equal, round, and reactive to light.  Neck: Normal range of motion. Neck supple. No thyromegaly present.  Cardiovascular: Normal rate and regular rhythm.   Pulmonary/Chest: Effort normal and breath  sounds normal. No stridor.  Abdominal: Soft. There is no tenderness.  Neurological: She is alert.  Neurologic exam:  Speech clear, pupils equal round reactive to light, extraocular movements intact  Normal peripheral visual fields Cranial nerves III through XII grossly intact with exception of R side facial droop, no proptosis. Follows commands, moves all extremities x4, normal strength to bilateral upper and lower extremities at all major muscle groups including grip Sensation normal to light touch and pinprick Coordination intact, no limb ataxia, finger-nose-finger normal Rapid alternating movements normal No pronator drift  Gait normal   Skin: No rash noted.  Psychiatric: She has a normal mood and affect.  Nursing note and vitals reviewed.    ED Treatments / Results  Labs (all labs ordered are listed, but only abnormal results are displayed) Labs Reviewed  CBC WITH DIFFERENTIAL/PLATELET - Abnormal; Notable for the following:       Result Value   WBC 11.0 (*)    Neutro Abs 8.8 (*)    All other components within normal limits  I-STAT CHEM 8, ED - Abnormal; Notable for the following:    BUN 5 (*)    Hemoglobin 15.6 (*)    All other components within normal limits    EKG  EKG Interpretation None       Radiology No results found.  Procedures Procedures (including critical care time)  Medications Ordered in ED Medications  famotidine (PEPCID) IVPB 20 mg premix (20 mg Intravenous New Bag/Given 05/21/16 1317)  predniSONE (DELTASONE) tablet 60 mg (60 mg Oral Given 05/21/16 1011)  sodium chloride 0.9 % bolus 1,000 mL (1,000 mLs Intravenous New Bag/Given 05/21/16 1325)  dexamethasone (DECADRON) injection 10 mg (10 mg Intravenous Given 05/21/16 1305)  diphenhydrAMINE (BENADRYL) injection 25 mg (25 mg Intravenous Given 05/21/16 1307)     Initial Impression / Assessment and Plan / ED Course  I have reviewed the triage vital signs and the nursing notes.  Pertinent labs &  imaging results that were available during my care of the patient were reviewed by me and considered in my medical decision making (see chart for details).  Clinical Course    BP 132/92 (BP Location: Right Arm)   Pulse 84   Temp 99.3 F (37.4 C) (Oral)   Resp 16   Ht 5\' 4"  (1.626 m)   Wt 112.5 kg   LMP 01/20/2016 (Approximate)   SpO2 97%   BMI 42.57 kg/m    Final Clinical Impressions(s) / ED Diagnoses   Final diagnoses:  Angioedema of lips, initial encounter    New Prescriptions New Prescriptions   No medications on file   6:56 AM Patient presents with acute onset of right-sided facial droop. Symptoms suggestive of Bell's palsy. Low suspicion for stroke. Care discussed with Dr. Ralene Bathe.  7:56 AM Dr. Ralene Bathe and myself have evaluated pt. Pt has R side facial droop but currently no forehead involvement.  We have consulted oncall neurologist Dr. Tasia Catchings who recommend steroid course and f/u with PCP.  He mentioned if there's strong suspicion for Stroke then pt may need an brain MRI.    After discussing with pt, we will obtain brain MRI w/o CM to r/o stroke.  Pt given 60mg  of Prednisone  12:13 PM Patient is currently awaiting for the MRI. However on reexamination, patient has increased facial swelling involving her entire upper lip and part of this lower lip, crossing the facial midline. No airway compromise: Lungs are clear without wheezing, no respiratory distress, no abdominal cramping. Patient denies any recent medication change or any other changes that can precipitate allergies. At this time, I suspicion for a stroke is very low therefore I will cancel the brain MRI. Plan to obtain basic labs, provide patient with IV fluid, additional steroids, Benadryl and Pepcid. Anticipate admission. Care discussed with Dr. Laverta Baltimore.  1:41 PM Appreciate consultation from [facility], Dr. Cruzita Lederer who agrees to admit to obs, under his care.     Domenic Moras, PA-C 05/21/16 1342    Quintella Reichert, MD 05/22/16 [phone]

## 2016-05-21 NOTE — ED Triage Notes (Signed)
Patient c/o right sided heaviness and numbness since yesterday at 0700.  Patient denies allergies, denies difficulty swallowing.  Patient has clear speech, equal grips in triage.  Patient denies chest pain, denies SOB at this time.  Patient denies pain only "numbing feeling on the right side of the face.

## 2016-05-22 DIAGNOSIS — R002 Palpitations: Secondary | ICD-10-CM | POA: Diagnosis not present

## 2016-05-22 DIAGNOSIS — G43009 Migraine without aura, not intractable, without status migrainosus: Secondary | ICD-10-CM

## 2016-05-22 DIAGNOSIS — T783XXA Angioneurotic edema, initial encounter: Secondary | ICD-10-CM | POA: Diagnosis not present

## 2016-05-22 DIAGNOSIS — T7840XA Allergy, unspecified, initial encounter: Secondary | ICD-10-CM | POA: Diagnosis not present

## 2016-05-22 MED ORDER — DIPHENHYDRAMINE HCL 25 MG PO CAPS
25.0000 mg | ORAL_CAPSULE | Freq: Four times a day (QID) | ORAL | Status: DC
  Administered 2016-05-22 – 2016-05-24 (×8): 25 mg via ORAL
  Filled 2016-05-22 (×8): qty 1

## 2016-05-22 MED ORDER — PANTOPRAZOLE SODIUM 40 MG PO TBEC
40.0000 mg | DELAYED_RELEASE_TABLET | Freq: Every day | ORAL | Status: DC
  Administered 2016-05-22 – 2016-05-24 (×3): 40 mg via ORAL
  Filled 2016-05-22 (×3): qty 1

## 2016-05-22 MED ORDER — MONTELUKAST SODIUM 10 MG PO TABS
10.0000 mg | ORAL_TABLET | Freq: Every day | ORAL | Status: DC
  Administered 2016-05-22 – 2016-05-24 (×3): 10 mg via ORAL
  Filled 2016-05-22 (×3): qty 1

## 2016-05-22 MED ORDER — VALACYCLOVIR HCL 500 MG PO TABS
500.0000 mg | ORAL_TABLET | Freq: Every day | ORAL | Status: DC
  Administered 2016-05-22 – 2016-05-23 (×2): 500 mg via ORAL
  Filled 2016-05-22 (×2): qty 1

## 2016-05-22 MED ORDER — LORATADINE 10 MG PO TABS
10.0000 mg | ORAL_TABLET | Freq: Every day | ORAL | Status: DC
  Administered 2016-05-22 – 2016-05-24 (×3): 10 mg via ORAL
  Filled 2016-05-22 (×3): qty 1

## 2016-05-22 MED ORDER — ACETAMINOPHEN 325 MG PO TABS
650.0000 mg | ORAL_TABLET | ORAL | Status: DC | PRN
  Administered 2016-05-22 – 2016-05-24 (×4): 650 mg via ORAL
  Filled 2016-05-22 (×5): qty 2

## 2016-05-22 MED ORDER — FAMOTIDINE 20 MG PO TABS
20.0000 mg | ORAL_TABLET | Freq: Two times a day (BID) | ORAL | Status: DC
  Administered 2016-05-22 – 2016-05-24 (×4): 20 mg via ORAL
  Filled 2016-05-22 (×4): qty 1

## 2016-05-22 MED ORDER — LEVOCETIRIZINE DIHYDROCHLORIDE 5 MG PO TABS: 5.0000 mg | ORAL_TABLET | Freq: Every day | ORAL | Status: DC

## 2016-05-22 NOTE — Progress Notes (Signed)
PROGRESS NOTE    Jaclyn Haynes  [id number] DOB: April 22, 1971 DOA: 05/21/2016  PCP: Cleda Mccreedy   Brief Narrative:  45 y/o female with h/o asthma presents to the ER for numbness and tingling of her upper lip. She developed swelling of her lip once in the ER and was admitted for angioedema.   Subjective: Feels that swelling has improved. No cough, pain, nausea, vomiting, diarrhea, constipation.   Assessment & Plan:   Principal Problem:   Angioedema of lips - etiology? - no new medications  - cont Steroids, Benadryl and Pepcid - will need outpt f/u with allergist  Active Problems:   Migraine without aura and without status migrainosus, not intractable - resolved- uses Topamax only PRN  Asthma - stable- cont Singulair, Dulera  HTN - cont Toprol   DVT prophylaxis: SCDs, ambulating Code Status: Full code Family Communication: mother Disposition Plan: home  Consultants:   none Procedures:   none Antimicrobials:  Anti-infectives    Start     Dose/Rate Route Frequency Ordered Stop   05/22/16 1000  valACYclovir (VALTREX) tablet 500 mg     500 mg Oral Daily 05/22/16 0906         Objective: Vitals:   05/21/16 2053 05/22/16 0607 05/22/16 0755 05/22/16 0836  BP: 103/78 119/85  125/77  Pulse: 88 83  100  Resp: 18 16    Temp: 98.2 F (36.8 C) 98.8 F (37.1 C)    TempSrc: Oral Oral    SpO2: 98% 98% 95%   Weight:      Height:        Intake/Output Summary (Last 24 hours) at 05/22/16 1243 Last data filed at 05/22/16 0905  Gross per 24 hour  Intake              940 ml  Output                0 ml  Net              940 ml   Filed Weights   05/21/16 0540 05/21/16 1610  Weight: 112.5 kg (248 lb) 111.5 kg (245 lb 13.3 oz)    Examination: General exam: Appears comfortable  HEENT: PERRLA, oral mucosa moist, no sclera icterus or thrush- mild- moderate swelling of upper lip Respiratory system: Clear to auscultation. Respiratory effort  normal. Cardiovascular system: S1 & S2 heard, RRR.  No murmurs  Gastrointestinal system: Abdomen soft, non-tender, nondistended. Normal bowel sound. No organomegaly Central nervous system: Alert and oriented. No focal neurological deficits. Extremities: No cyanosis, clubbing or edema Skin: No rashes or ulcers Psychiatry:  Mood & affect appropriate.     Data Reviewed: I have personally reviewed following labs and imaging studies  CBC:  Recent Labs Lab 05/21/16 1240 05/21/16 1248  WBC 11.0*  --   NEUTROABS 8.8*  --   HGB 13.7 15.6*  HCT 41.6 46.0  MCV 87.2  --   PLT 308  --    Basic Metabolic Panel:  Recent Labs Lab 05/21/16 1248  NA 140  K 3.7  CL 102  GLUCOSE 99  BUN 5*  CREATININE 0.60   GFR: Estimated Creatinine Clearance: 108.5 mL/min (by C-G formula based on SCr of 0.8 mg/dL). Liver Function Tests: No results for input(s): AST, ALT, ALKPHOS, BILITOT, PROT, ALBUMIN in the last 168 hours. No results for input(s): LIPASE, AMYLASE in the last 168 hours. No results for input(s): AMMONIA in the last 168 hours. Coagulation Profile: No results for input(s):  INR, PROTIME in the last 168 hours. Cardiac Enzymes: No results for input(s): CKTOTAL, CKMB, CKMBINDEX, TROPONINI in the last 168 hours. BNP (last 3 results) No results for input(s): PROBNP in the last 8760 hours. HbA1C: No results for input(s): HGBA1C in the last 72 hours. CBG: No results for input(s): GLUCAP in the last 168 hours. Lipid Profile: No results for input(s): CHOL, HDL, LDLCALC, TRIG, CHOLHDL, LDLDIRECT in the last 72 hours. Thyroid Function Tests: No results for input(s): TSH, T4TOTAL, FREET4, T3FREE, THYROIDAB in the last 72 hours. Anemia Panel: No results for input(s): VITAMINB12, FOLATE, FERRITIN, TIBC, IRON, RETICCTPCT in the last 72 hours. Urine analysis: No results found for: COLORURINE, APPEARANCEUR, LABSPEC, PHURINE, GLUCOSEU, HGBUR, BILIRUBINUR, KETONESUR, PROTEINUR, UROBILINOGEN,  NITRITE, LEUKOCYTESUR Sepsis Labs: @LABRCNTIP (procalcitonin:4,lacticidven:4) )No results found for this or any previous visit (from the past 240 hour(s)).       Radiology Studies: No results found.    Scheduled Meds: . diphenhydrAMINE  25 mg Oral Q6H  . famotidine  20 mg Oral BID  . ferrous sulfate  325 mg Oral Q breakfast  . loratadine  10 mg Oral Daily  . methylPREDNISolone (SOLU-MEDROL) injection  60 mg Intravenous Q12H  . metoprolol succinate  25 mg Oral Daily  . mometasone-formoterol  2 puff Inhalation BID  . montelukast  10 mg Oral Daily  . pantoprazole  40 mg Oral Daily  . valACYclovir  500 mg Oral Daily   Continuous Infusions:    LOS: 0 days    Time spent in minutes: 64    [place], MD [facility] Pager: [email or web address] Password [facility] 05/22/2016, 12:43 PM

## 2016-05-23 DIAGNOSIS — T7840XA Allergy, unspecified, initial encounter: Secondary | ICD-10-CM | POA: Diagnosis not present

## 2016-05-23 DIAGNOSIS — G43009 Migraine without aura, not intractable, without status migrainosus: Secondary | ICD-10-CM | POA: Diagnosis not present

## 2016-05-23 DIAGNOSIS — T783XXA Angioneurotic edema, initial encounter: Secondary | ICD-10-CM | POA: Diagnosis not present

## 2016-05-23 DIAGNOSIS — I10 Essential (primary) hypertension: Secondary | ICD-10-CM

## 2016-05-23 DIAGNOSIS — R002 Palpitations: Secondary | ICD-10-CM | POA: Diagnosis not present

## 2016-05-23 MED ORDER — SODIUM CHLORIDE 0.9 % IV SOLN
INTRAVENOUS | Status: DC
  Administered 2016-05-23: 10:00:00 via INTRAVENOUS

## 2016-05-23 MED ORDER — HYDROXYZINE HCL 10 MG PO TABS
10.0000 mg | ORAL_TABLET | Freq: Three times a day (TID) | ORAL | Status: DC
  Administered 2016-05-23 – 2016-05-24 (×3): 10 mg via ORAL
  Filled 2016-05-23 (×4): qty 1

## 2016-05-23 MED ORDER — C1 ESTERASE INHIBITOR (HUMAN) 500 UNITS IV KIT
2500.0000 [IU] | PACK | Freq: Once | INTRAVENOUS | Status: AC
  Administered 2016-05-23: 2500 [IU] via INTRAVENOUS
  Filled 2016-05-23: qty 2500

## 2016-05-23 NOTE — Progress Notes (Addendum)
PROGRESS NOTE    Jaclyn Haynes  [id number] DOB: [birth year] DOA: 05/21/2016  PCP: Cleda Mccreedy   Brief Narrative:  45 y/o female with h/o asthma presents to the ER for numbness and tingling of her upper lip. She developed swelling of her lip once in the ER and was admitted for angioedema.   Subjective: Lip swelling has improved but now having cough when eating and thinks her throat is swollen. No cough, pain, nausea, vomiting, diarrhea, constipation.   Assessment & Plan:   Principal Problem:   Angioedema of lips - etiology? - no new medications  - now has mild swelling of right maxillary area and c/o swelling of throat (no distress noted)-  tongue is not swollen - cont IV Steroids, Benadryl, Singulair, Claritin and Pepcid - add C1 esterase inhibitor injection today - plan discussed with allergist- Dr Neldon Mc- add Hydroxyzine today- hold all home meds in case she has developed a new allergy to them - f/u in AM  Active Problems:   Migraine without aura and without status migrainosus, not intractable - resolved- uses Topamax only PRN  Asthma - stable- cont Singulair, Dulera  HTN - holding Toprol- last dose was yesterday  H/o Herpes - hold Valtrex today- received AM dose   DVT prophylaxis: SCDs, ambulating Code Status: Full code Family Communication: mother Disposition Plan: home  Consultants:   none Procedures:   none Antimicrobials:  Anti-infectives    Start     Dose/Rate Route Frequency Ordered Stop   05/22/16 1000  valACYclovir (VALTREX) tablet 500 mg     500 mg Oral Daily 05/22/16 0906         Objective: Vitals:   05/22/16 2113 05/23/16 0438 05/23/16 0557 05/23/16 0759  BP: 119/71 130/83 130/84   Pulse: 76 76 71   Resp: 16 16 18    Temp: 99.6 F (37.6 C) 99.1 F (37.3 C) 97.8 F (36.6 C)   TempSrc: Oral Oral Oral   SpO2: 97% 96% 97% 94%  Weight:      Height:        Intake/Output Summary (Last 24 hours) at 05/23/16 1244 Last data  filed at 05/23/16 0900  Gross per 24 hour  Intake             1540 ml  Output                0 ml  Net             1540 ml   Filed Weights   05/21/16 0540 05/21/16 1610  Weight: 112.5 kg (248 lb) 111.5 kg (245 lb 13.3 oz)    Examination: General exam: Appears comfortable  HEENT: PERRLA, oral mucosa moist, no sclera icterus or thrush- swelling of upper lip- now has swelling of right maxillary area, tongue is not swollen Respiratory system: Clear to auscultation. Respiratory effort normal. Cardiovascular system: S1 & S2 heard, RRR.  No murmurs  Gastrointestinal system: Abdomen soft, non-tender, nondistended. Normal bowel sound. No organomegaly Central nervous system: Alert and oriented. No focal neurological deficits. Extremities: No cyanosis, clubbing or edema Skin: No rashes or ulcers Psychiatry:  Mood & affect appropriate.     Data Reviewed: I have personally reviewed following labs and imaging studies  CBC:  Recent Labs Lab 05/21/16 1240 05/21/16 1248  WBC 11.0*  --   NEUTROABS 8.8*  --   HGB 13.7 15.6*  HCT 41.6 46.0  MCV 87.2  --   PLT 308  --    Basic  Metabolic Panel:  Recent Labs Lab 05/21/16 1248  NA 140  K 3.7  CL 102  GLUCOSE 99  BUN 5*  CREATININE 0.60   GFR: Estimated Creatinine Clearance: 108.5 mL/min (by C-G formula based on SCr of 0.8 mg/dL). Liver Function Tests: No results for input(s): AST, ALT, ALKPHOS, BILITOT, PROT, ALBUMIN in the last 168 hours. No results for input(s): LIPASE, AMYLASE in the last 168 hours. No results for input(s): AMMONIA in the last 168 hours. Coagulation Profile: No results for input(s): INR, PROTIME in the last 168 hours. Cardiac Enzymes: No results for input(s): CKTOTAL, CKMB, CKMBINDEX, TROPONINI in the last 168 hours. BNP (last 3 results) No results for input(s): PROBNP in the last 8760 hours. HbA1C: No results for input(s): HGBA1C in the last 72 hours. CBG: No results for input(s): GLUCAP in the last 168  hours. Lipid Profile: No results for input(s): CHOL, HDL, LDLCALC, TRIG, CHOLHDL, LDLDIRECT in the last 72 hours. Thyroid Function Tests: No results for input(s): TSH, T4TOTAL, FREET4, T3FREE, THYROIDAB in the last 72 hours. Anemia Panel: No results for input(s): VITAMINB12, FOLATE, FERRITIN, TIBC, IRON, RETICCTPCT in the last 72 hours. Urine analysis: No results found for: COLORURINE, APPEARANCEUR, LABSPEC, PHURINE, GLUCOSEU, HGBUR, BILIRUBINUR, KETONESUR, PROTEINUR, UROBILINOGEN, NITRITE, LEUKOCYTESUR Sepsis Labs: @LABRCNTIP (procalcitonin:4,lacticidven:4) )No results found for this or any previous visit (from the past 240 hour(s)).       Radiology Studies: No results found.    Scheduled Meds: . diphenhydrAMINE  25 mg Oral Q6H  . famotidine  20 mg Oral BID  . ferrous sulfate  325 mg Oral Q breakfast  . hydrOXYzine  10 mg Oral TID  . loratadine  10 mg Oral Daily  . methylPREDNISolone (SOLU-MEDROL) injection  60 mg Intravenous Q12H  . mometasone-formoterol  2 puff Inhalation BID  . montelukast  10 mg Oral Daily  . pantoprazole  40 mg Oral Daily  . valACYclovir  500 mg Oral Daily   Continuous Infusions: . sodium chloride 10 mL/hr at 05/23/16 0955     LOS: 0 days    Time spent in minutes: 4    [place], MD [facility] Pager: [email or web address] Password [facility] 05/23/2016, 12:44 PM

## 2016-05-24 ENCOUNTER — Encounter: Payer: Self-pay | Admitting: Internal Medicine

## 2016-05-24 DIAGNOSIS — T783XXA Angioneurotic edema, initial encounter: Secondary | ICD-10-CM | POA: Diagnosis not present

## 2016-05-24 DIAGNOSIS — R002 Palpitations: Secondary | ICD-10-CM | POA: Diagnosis not present

## 2016-05-24 DIAGNOSIS — G43009 Migraine without aura, not intractable, without status migrainosus: Secondary | ICD-10-CM | POA: Diagnosis not present

## 2016-05-24 LAB — BASIC METABOLIC PANEL
ANION GAP: 8 (ref 5–15)
BUN: 12 mg/dL (ref 6–20)
CHLORIDE: 107 mmol/L (ref 101–111)
CO2: 24 mmol/L (ref 22–32)
Calcium: 9.1 mg/dL (ref 8.9–10.3)
Creatinine, Ser: 0.79 mg/dL (ref 0.44–1.00)
GFR calc Af Amer: >60 mL/min (ref >60)
Glucose, Bld: 113 mg/dL — ABNORMAL HIGH (ref 65–99)
POTASSIUM: 3.7 mmol/L (ref 3.5–5.1)
Sodium: 139 mmol/L (ref 135–145)

## 2016-05-24 LAB — CBC WITH DIFFERENTIAL/PLATELET
BASOS ABS: 0 10*3/uL (ref 0.0–0.1)
Basophils Relative: 0 %
EOS ABS: 0 10*3/uL (ref 0.0–0.7)
EOS PCT: 0 %
HCT: 40.5 % (ref 36.0–46.0)
HEMOGLOBIN: 13.3 g/dL (ref 12.0–15.0)
LYMPHS ABS: 1.2 10*3/uL (ref 0.7–4.0)
LYMPHS PCT: 7 %
MCH: 28.9 pg (ref 26.0–34.0)
MCHC: 32.8 g/dL (ref 30.0–36.0)
MCV: 88 fL (ref 78.0–100.0)
Monocytes Absolute: 0.8 10*3/uL (ref 0.1–1.0)
Monocytes Relative: 5 %
NEUTROS PCT: 88 %
Neutro Abs: 15.3 10*3/uL — ABNORMAL HIGH (ref 1.7–7.7)
PLATELETS: 218 10*3/uL (ref 150–400)
RBC: 4.6 MIL/uL (ref 3.87–5.11)
RDW: 14.3 % (ref 11.5–15.5)
WBC: 17.3 10*3/uL — AB (ref 4.0–10.5)

## 2016-05-24 LAB — C4 COMPLEMENT: COMPLEMENT C4, BODY FLUID: 22 mg/dL (ref 14–44)

## 2016-05-24 LAB — SEDIMENTATION RATE: SED RATE: 5 mm/h (ref 0–22)

## 2016-05-24 LAB — C1 ESTERASE INHIBITOR: C1INH SerPl-mCnc: 24 mg/dL (ref 21–39)

## 2016-05-24 LAB — C-REACTIVE PROTEIN: CRP: 0.6 mg/dL (ref <1.0)

## 2016-05-24 MED ORDER — PREDNISONE 10 MG PO TABS
ORAL_TABLET | ORAL | 0 refills | Status: DC
Start: 2016-05-24 — End: 2016-07-27

## 2016-05-24 MED ORDER — PROBIOTIC DAILY PO CAPS: ORAL_CAPSULE | ORAL | Status: AC

## 2016-05-24 MED ORDER — DIPHENHYDRAMINE HCL 25 MG PO CAPS: 25.0000 mg | ORAL_CAPSULE | Freq: Four times a day (QID) | ORAL | 0 refills | Status: DC

## 2016-05-24 MED ORDER — FERROUS SULFATE 325 (65 FE) MG PO TABS: 325.0000 mg | ORAL_TABLET | Freq: Every day | ORAL | 3 refills | Status: DC

## 2016-05-24 MED ORDER — FAMOTIDINE 20 MG PO TABS: 20.0000 mg | ORAL_TABLET | Freq: Two times a day (BID) | ORAL | 0 refills | Status: DC

## 2016-05-24 MED ORDER — VALACYCLOVIR HCL 500 MG PO TABS: 500.0000 mg | ORAL_TABLET | Freq: Every day | ORAL | Status: AC

## 2016-05-24 NOTE — Discharge Instructions (Signed)
Angioedema  Angioedema is a sudden swelling of tissues, often of the skin. It can occur on the face or genitals or in the abdomen or other body parts. The swelling usually develops over a short period and gets better in 24 to 48 hours. It often begins during the night and is found when the person wakes up. The person may also get red, itchy patches of skin (hives). Angioedema can be dangerous if it involves swelling of the air passages.   Depending on the cause, episodes of angioedema may only happen once, come back in unpredictable patterns, or repeat for several years and then gradually fade away.   CAUSES   Angioedema can be caused by an allergic reaction to various triggers. It can also result from nonallergic causes, including reactions to drugs, immune system disorders, viral infections, or an abnormal gene that is passed to you from your parents (hereditary). For some people with angioedema, the cause is unknown.   Some things that can trigger angioedema include:    Foods.    Medicines, such as ACE inhibitors, ARBs, nonsteroidal anti-inflammatory agents, or estrogen.    Latex.    Animal saliva.    Insect stings.    Dyes used in X-rays.    Mild injury.    Dental work.   Surgery.   Stress.    Sudden changes in temperature.    Exercise.  SIGNS AND SYMPTOMS    Swelling of the skin.   Hives. If these are present, there is also intense itching.   Redness in the affected area.    Pain in the affected area.   Swollen lips or tongue.   Breathing problems. This may happen if the air passages swell.   Wheezing.  If internal organs are involved, there may be:    Nausea.    Abdominal pain.    Vomiting.    Difficulty swallowing.    Difficulty passing urine.  DIAGNOSIS    Your health care provider will examine the affected area and take a medical and family history.   Various tests may be done to help determine the cause. Tests may include:   Allergy skin tests to see if the problem  is an allergic reaction.    Blood tests to check for hereditary angioedema.    Tests to check for underlying diseases that could cause the condition.    A review of your medicines, including over-the-counter medicines, may be done.  TREATMENT   Treatment will depend on the cause of the angioedema. Possible treatments include:    Removal of anything that triggered the condition (such as stopping certain medicines).    Medicines to treat symptoms or prevent attacks. Medicines given may include:     Antihistamines.     Epinephrine injection.     Steroids.    Hospitalization may be required for severe attacks. If the air passages are affected, it can be an emergency. Tubes may need to be placed to keep the airway open.  HOME CARE INSTRUCTIONS    Take all medicines as directed by your health care provider.   If you were given medicines for emergency allergy treatment, always carry them with you.   Wear a medical bracelet as directed by your health care provider.    Avoid known triggers.  SEEK MEDICAL CARE IF:    You have repeat attacks of angioedema.    Your attacks are more frequent or more severe despite preventive measures.    You have hereditary angioedema   and are considering having children. It is important to discuss with your health care provider the risks of passing the condition on to your children.  SEEK IMMEDIATE MEDICAL CARE IF:    You have severe swelling of the mouth, tongue, or lips.   You have difficulty breathing.    You have difficulty swallowing.    You faint.  MAKE SURE YOU:   Understand these instructions.   Will watch your condition.   Will get help right away if you are not doing well or get worse.     This information is not intended to replace advice given to you by your health care provider. Make sure you discuss any questions you have with your health care provider.     Document Released: 11/20/2001 Document Revised: 10/02/2014 Document Reviewed:  05/05/2013  [employer]vier Interactive Patient Education 2016 [employer]

## 2016-05-25 ENCOUNTER — Encounter: Payer: Self-pay | Admitting: Allergy & Immunology

## 2016-05-25 ENCOUNTER — Ambulatory Visit (INDEPENDENT_AMBULATORY_CARE_PROVIDER_SITE_OTHER): Payer: 59 | Admitting: Allergy & Immunology

## 2016-05-25 VITALS — BP 138/88 | HR 72 | Temp 98.6°F | Resp 16 | Ht 64.0 in | Wt 247.6 lb

## 2016-05-25 DIAGNOSIS — J309 Allergic rhinitis, unspecified: Secondary | ICD-10-CM

## 2016-05-25 DIAGNOSIS — R220 Localized swelling, mass and lump, head: Secondary | ICD-10-CM

## 2016-05-25 DIAGNOSIS — J4540 Moderate persistent asthma, uncomplicated: Secondary | ICD-10-CM

## 2016-05-25 DIAGNOSIS — T781XXD Other adverse food reactions, not elsewhere classified, subsequent encounter: Secondary | ICD-10-CM

## 2016-05-25 DIAGNOSIS — J3089 Other allergic rhinitis: Secondary | ICD-10-CM

## 2016-05-25 MED ORDER — BUDESONIDE-FORMOTEROL FUMARATE 160-4.5 MCG/ACT IN AERO: 2.0000 | INHALATION_SPRAY | Freq: Two times a day (BID) | RESPIRATORY_TRACT | 5 refills | Status: DC

## 2016-05-25 NOTE — Patient Instructions (Addendum)
1. Moderate persistent asthma, uncomplicated - Start Symbicort 160/4.5 two puffs in the morning and two puffs at night.  - This medication needs a spacer. - Hopefully this will help your shortness of breath. - Lung function looked fairly good today with reversibility, so we could do better from an asthma control perspective.   2. Swelling of both lips - We will send more C1 esterase function, tryptase, alpha gal panel - Continue with antihistamines through Saturday and then stop.  3. Perennial allergic rhinitis - We will do skin testing at the next visit (Wednesday)  - Continue current medications for now (but no antihistamines after Saturday)  4. Adverse food reaction, subsequent encounter - We will get skin testing for the foods next week.  5. Return to clinic next week for skin testing.

## 2016-05-25 NOTE — Progress Notes (Signed)
NEW PATIENT  Date of Service/Encounter:  05/25/16   Assessment:   Moderate persistent asthma, uncomplicated - Plan: Spirometry with Graph  Swelling of both lips - Plan: C1 esterase inhibitor, functional, Tryptase, Alpha-Gal Panel  Perennial allergic rhinitis  Adverse food reaction, subsequent encounter   Asthma Reportables:  Severity: : moderate persistent  Risk: low Control: not well controlled  Seasonal Influenza Vaccine: no but encouraged     Plan/Recommendations:     1. Moderate persistent asthma, uncomplicated - She does endorse hoarseness with the use of the Advair, which is a common complaint with the diskus. - Will change to Symbicort 160/4.5 two puffs BID. - Spacer and teaching provided.  - Hopefully this will help your shortness of breath. - Lung function looked fairly good today.  2. Swelling of both lips - We will send more C1 esterase function and tryptase level. - Continue with antihistamines through Saturday and then stop.  3. Perennial allergic rhinitis - We will do skin testing at the next visit (Wednesday)  - Continue current medications for now (but no antihistamines after Saturday)  4. Adverse food reaction, subsequent encounter - We will get skin testing for the foods next week. - Her symptoms with the tomato and peanut are consistent with an IgE-mediated reaction, although the other foods are rather vague.  - We will plan to get the most common foods along with tomato at the next visit.   5. Return to clinic next week for skin testing.      Subjective:   Jaclyn Haynes is a 45 y.o. female presenting today for evaluation of  Chief Complaint  Patient presents with  . Allergic Reaction    facial swelling day after pt had a cupcake from [employer] with icing, baked chicken thigh, roll, and and rice piloft. she has had all of these items before.  Jaclyn Haynes has a history of the following: Patient Active Problem List   Diagnosis Date Noted  . Angioedema of lips 05/21/2016  . Allergic reaction 05/21/2016  . Palpitation 08/04/2015  . Migraine without aura and without status migrainosus, not intractable 10/27/2014  . Dizziness and giddiness 10/27/2013  . Disturbance of skin sensation 10/27/2013    History obtained from: chart review and patient.  Jaclyn Haynes was referred by Haynes,NOELLE, PA-C.     Jaclyn Haynes is a 45 y.o. female presenting for lip swelling that started this past Sunday around 4:30am. She was going to her second job ([employer]) which is why she was up so early. She had lip swelling with some difficulty breathing and went to the ED. In the ED, she was given prednisone, pepcid, and benadryl. The only thing she had before the reaction was a cupcake, chicken thigh, roll, and rice pilaf. She ate around 10pm the evening before. She was actually admitted to the hospital and discharged yesterday. During the admission, there was concern for Bell's palsy. C1 esterase level was normal and C1q is pending. C1 esterase function was not sent.  Jaclyn Haynes feels that she might be allergic to bee venom, peanuts, and tomatoes. When she eats tomatoes and peanuts, she will break out in hives. Jaclyn Haynes has noticed the symptoms with tomatoes for 10+ years and then peanut butter (started around February 2017). She does not consume tree nuts at all. She does eat wheat containing pasta. She does eat soy sauce very rarely. She does eat eggs intermittently but she does not like it. She is lactose intolerant  and she has diarrhea with cow's milk (she does fine with Lactaid milk). She rarely eats fish but does eat shellfish a little more often. She does eat hummus very often.    When she is stung, she will develop hives and swelling around the bite and up the arm (does not cross a joint). She treats this with benadryl and her inhaler; she did not need to go the ED for any treatments. 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 She is unsure what insect it was (one bee and two wasps). Her reaction has not seemed to have gotten worse over time.   There is no one in her family who HAE. She does have abdominal pain from unknown causes, but has never needed any ED intervention. She did have her gallbladder removed but otherwise no abdominal surgeries.    Asthma/Respiratory Symptom History: Rescue medication: albuterol (uses this around 3-4 times during the week) Controller(s): Advair 250/50 one puff BID Triggers:  exercise and pollens  Average frequency of daytime symptoms: intermittent Average frequency of nocturnal symptoms: 1-2 times per week (treats with albuterol)  Average frequency of exercise symptoms: intermittent Hospitalizations for respiratory symptoms since last visit (self report): 0 ED or Urgent Care visits for respiratory symptoms since last visit (self report): 0 Oral/systemic corticosteroids for respiratory symptoms since last visit:0  Allergic Rhinitis Symptom History:  Symptoms: nasal congestion, sneezing, eye itching, eye irritation and watery eyes Times of year: throughout the year Exacerbating environments: pollens  Treatments tried: Xyzal 46m daily (seems to work), [facility](does not take due to burning), Singulair 151m(helps) Allergy tested in the past: No  On IT in the past: No    Otherwise, there is no history of other atopic diseases, including drug allergies, stinging insect allergies, or urticaria. There is no significant infectious history. Vaccinations are up to date.    Past Medical History: Patient Active Problem List   Diagnosis Date Noted  . Angioedema of lips 05/21/2016  . Allergic reaction 05/21/2016  . Palpitation 08/04/2015  . Migraine without aura and without status migrainosus, not intractable 10/27/2014  . Dizziness and giddiness 10/27/2013  . Disturbance of skin sensation 10/27/2013    Medication List:    Medication List       Accurate as of  05/25/16 10:57 PM. Always use your most recent med list.          acetaminophen 500 MG tablet Commonly known as:  TYLENOL Take 1,000 mg by mouth every 6 (six) hours as needed for moderate pain.   albuterol 108 (90 Base) MCG/ACT inhaler Commonly known as:  PROVENTIL HFA;VENTOLIN HFA Inhale 2 puffs into the lungs every 6 (six) hours as needed for wheezing.   budesonide-formoterol 160-4.5 MCG/ACT inhaler Commonly known as:  SYMBICORT Inhale 2 puffs into the lungs 2 (two) times daily.   diphenhydrAMINE 25 mg capsule Commonly known as:  BENADRYL Take 1 capsule (25 mg total) by mouth every 6 (six) hours.   famotidine 20 MG tablet Commonly known as:  PEPCID Take 1 tablet (20 mg total) by mouth 2 (two) times daily.   ferrous sulfate 325 (65 FE) MG tablet Take 1 tablet (325 mg total) by mouth daily with breakfast. Hold until Recommended to take by allergy specialist.   FLUoxetine 20 MG tablet Commonly known as:  PROZAC Take 20 mg by mouth daily.   fluticasone 50 MCG/ACT nasal spray Commonly known as:  FLONASE SHAKE WELL AND U 2 SPRAYS IEN QD  PRN for sinus drainage or stuffiness   Fluticasone-Salmeterol 250-50 MCG/DOSE Aepb Commonly known as:  ADVAIR Inhale 1 puff into the lungs daily.   levocetirizine 5 MG tablet Commonly known as:  XYZAL Take 5 mg by mouth every evening.   metoprolol succinate 25 MG 24 hr tablet Commonly known as:  TOPROL-XL TAKE 1 TABLET BY MOUTH ONCE DAILY FOR PALPITATION   montelukast 10 MG tablet Commonly known as:  SINGULAIR TK 1 T PO QD IN THE EVENING   omeprazole 40 MG capsule Commonly known as:  PRILOSEC Take 40 mg by mouth daily as needed (acid reflux).   predniSONE 10 MG tablet Commonly known as:  DELTASONE Take 50mg daily for 3days,Take 40mg daily for 3days,Take 30mg daily for 3days,Take 20mg daily for 3days,Take 10mg daily for 3days, then stop.   PROBIOTIC DAILY Caps Hold until Recommended to take by allergy specialist.   topiramate  50 MG tablet Commonly known as:  TOPAMAX One tab po qam and 2 tabs po qhs   valACYclovir 500 MG tablet Commonly known as:  VALTREX Take 1 tablet (500 mg total) by mouth daily. Hold until Recommended to take by allergy specialist.       Birth History: non-contributory.  Developmental History: Kaydie has met all milestones on time.   Past Surgical History: Past Surgical History:  Procedure Laterality Date  . BREAST BIOPSY    . BREAST LUMPECTOMY Right   . CHOLECYSTECTOMY       Family History: Family History  Problem Relation Age of Onset  . Stroke Mother   . Hypertension Mother   . COPD Mother   . Thyroid nodules Mother   . Hyperlipidemia Mother   . Heart disease Father   . Hypertension Father   . Hyperlipidemia Father   . Hypertension Maternal Grandmother   . Hyperlipidemia Maternal Grandmother   . Diabetes Maternal Grandmother   . Thyroid nodules Maternal Grandmother   . Hypertension Maternal Grandfather   . Hyperlipidemia Maternal Grandfather   . Prostate cancer Maternal Grandfather   . Hypertension Paternal Grandmother   . Heart disease Paternal Grandfather   . Hypertension Paternal Grandfather      Social History: Pammy lives in a home that is 45 years old. There is carpeting throughout the home. She has gas heating and central cooling. There are no pets. There is no tobacco exposure. She does not use dust mite covers. 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   Review of Systems: a 14-point review of systems is pertinent for what is mentioned in HPI.  Otherwise, all other systems were negative. Constitutional: Positive for hoarseness, otherwise negative other than that listed in the HPI Eyes: negative other than that listed in the HPI Ears, nose, mouth, throat, and face: negative other than that listed in the HPI Respiratory: negative other than that listed in the HPI Cardiovascular: negative other than that listed in  the HPI Gastrointestinal: negative other than that listed in the HPI Genitourinary: negative other than that listed in the HPI Integument: negative other than that listed in the HPI Hematologic: negative other than that listed in the HPI Musculoskeletal: negative other than that listed in the HPI Neurological: negative other than that listed in the HPI Allergy/Immunologic: negative other than that listed in the HPI    Objective:   Blood pressure 138/88, pulse 72, temperature 98.6 F (37 C), temperature source Oral, resp. rate 16, height 5' 4" (1.626 m), weight 247   lb 9.6 oz (112.3 kg), last menstrual period 01/20/2016. Body mass index is 42.5 kg/m.   Physical Exam:  General: Alert, interactive, in no acute distress. Very pleasant female. HEENT: TMs pearly gray, turbinates edematous and pale with clear discharge, post-pharynx mildly erythematous. Very mild residual swelling of bilateral lobes. Neck: Supple without thyromegaly. Adenopathy: no enlarged lymph nodes appreciated in the anterior cervical, occipital, axillary, epitrochlear, inguinal, or popliteal regions Lungs: Clear to auscultation without wheezing, rhonchi or rales. No increased work of breathing. CV: Normal S1, S2 without murmurs. Capillary refill <2 seconds. Pulses 2+ bilateral upper extremities. Abdomen: Nondistended, nontender.  Skin: Warm and dry, without lesions or rashes. Extremities:  No clubbing, cyanosis or edema. Neuro:   Grossly intact. No focal deficits.   Diagnostic studies:  Spirometry: results normal (FEV1: 1.58/68%, FVC: 1.96/70%, FEV1/FVC: 80%).    Spirometry consistent with possible restrictive disease. Bronchodilator was administered with significant improvement in the FEV1 as well as mildly improved FVC. FEV1 to FVC ratio improved from 80% to 82%.  Allergy Studies: None    Joel Gallagher, MD FAAAAI [facility]     

## 2016-05-26 LAB — TRYPTASE: Tryptase: 2.1 ug/L (ref <11)

## 2016-05-28 LAB — C1 ESTERASE INHIBITOR, FUNCTIONAL

## 2016-05-30 LAB — ALPHA-GAL PANEL
Allergen, Mutton, f88: <0.1 kU/L
Galactose-alpha-1,3-galactose IgE: <0.1 kU/L (ref <0.35)

## 2016-05-30 LAB — COMPLEMENT COMPONENT C1Q: C1Q COMPLEMENT PROTEIN CC1Q: 16.4 mg/dL (ref 11.8–24.4)

## 2016-06-01 ENCOUNTER — Encounter: Payer: Self-pay | Admitting: Allergy & Immunology

## 2016-06-01 ENCOUNTER — Ambulatory Visit (INDEPENDENT_AMBULATORY_CARE_PROVIDER_SITE_OTHER): Payer: 59 | Admitting: Allergy & Immunology

## 2016-06-01 VITALS — BP 120/72 | HR 88 | Temp 98.8°F | Resp 16

## 2016-06-01 DIAGNOSIS — R079 Chest pain, unspecified: Secondary | ICD-10-CM | POA: Diagnosis not present

## 2016-06-01 DIAGNOSIS — J4540 Moderate persistent asthma, uncomplicated: Secondary | ICD-10-CM

## 2016-06-01 DIAGNOSIS — R220 Localized swelling, mass and lump, head: Secondary | ICD-10-CM | POA: Diagnosis not present

## 2016-06-01 DIAGNOSIS — J309 Allergic rhinitis, unspecified: Secondary | ICD-10-CM | POA: Diagnosis not present

## 2016-06-01 DIAGNOSIS — T781XXD Other adverse food reactions, not elsewhere classified, subsequent encounter: Secondary | ICD-10-CM

## 2016-06-01 DIAGNOSIS — J3089 Other allergic rhinitis: Secondary | ICD-10-CM

## 2016-06-01 MED ORDER — EPINEPHRINE 0.3 MG/0.3ML IJ SOAJ: 0.3000 mg | Freq: Once | INTRAMUSCULAR | 3 refills | Status: AC

## 2016-06-01 NOTE — Progress Notes (Signed)
FOLLOW UP  Date of Service/Encounter:  06/01/16   Assessment:   Moderate persistent asthma, uncomplicated - Plan: Spirometry with Graph  Swelling of both lips - Plan: Allergen Zone 3  Perennial allergic rhinitis - Plan: Allergen Zone 3  Adverse food reaction, subsequent encounter - Plan: Allergen Zone 3, Tomato IgE, Allergen, [place] Nut, f18, Allergy Panel 18, Nut Mix Group, CP658 Fish Panel, Allergy-Shellfish Panel, Allergen, [place] English, IgE  Chest pain, unspecified chest pain type   Asthma Reportables:  Severity: : moderate persistent  Risk: high Control: well controlled  Seasonal Influenza Vaccine: no but encouraged     Plan/Recommendations:    1. Moderate persistent asthma, uncomplicated - Continue Symbicort 160/4.5 two puffs twice daily. - Lung function appears normal.  - Her hoarseness has improved, therefore this was likely secondary to the Advair Diskus. - Her chest pain is unlikely to be related to Symbicort.  2. Idiopathic anaphylaxis - HAE and mastocytosis ruled out with the blood work from the last visit.  - We did prescribe an EpiPen today. - We did discuss indications for its use. - I will fill out her FMLA paperwork.  3. Perennial allergic rhinitis - We will get blood testing for environmental allergens since I do not feel comfortable skin testing her today with chest pain. - You can restart antihistamine (cetirizine 10mg  daily) - You can increase the cetirizine to 2-3 times daily if you are having breakthrough itching/swelling.  4. Adverse food reaction, subsequent encounter - We will get food testing via blood (including tomatoes) and the major allergens since I do not feel comfortable skin testing her today with chest pain. - If any show up positive, we will discuss their clinical relevance.  5. Chest pain - with a strong family history of heart disease including early MI - With her history of chest pain as well as the family history of  early myocardial infarctions, including early deaths, I did not feel comfortable performing skin testing today. -Symbicort is not known to cause chest pain, Therefore I do not feel this is related to her chest pain. - I felt that she needed to go to the ER for an evaluation, however the patient refused. - I finally was able to get her to agree to see her PCP today at the very least.     6. Return in about 3 months (around 08/31/2016).     Subjective:   Jaclyn Haynes is a 45 y.o. female presenting today for follow up of  Chief Complaint  Patient presents with  . Allergy Testing  . Medication Management    wants to discuss Symbicort  .  Jaclyn Haynes has a history of the following: Patient Active Problem List   Diagnosis Date Noted  . Angioedema of lips 05/21/2016  . Allergic reaction 05/21/2016  . Palpitation 08/04/2015  . Migraine without aura and without status migrainosus, not intractable 10/27/2014  . Dizziness and giddiness 10/27/2013  . Disturbance of skin sensation 10/27/2013    History obtained from: chart review and patient.  Jaclyn Haynes was referred by REDMON,NOELLE, PA-C.     Jaclyn Haynes is a 45 y.o. female presenting for a follow up visit for skin testing. She was last seen on August 31. At that time, she was having episodes of lip swelling. We did an additional workup including an alpha gal panel, tryptase, and C1 esterase inhibitor function and level. All of these were normal. [name] also has a history of moderate persistent asthma.  She was complaining of hoarseness while on her Advair diskus at the last visit, therefore we changed her to Symbicort 160/4.5 2 puffs twice a day. At that time, she was on antihistamines, therefore we brought her back today for skin testing, including both environmentals as well as tomato and peanut.   Since last visit, she has done well. She started the Symbicort last week. She did have some trouble swallowing, feeling as if  something was "lodged". She then developed chest pain this morning and did not take Symbicort this morning before she came. She is having left-sided chest pain. The pain travels to her left arm but is now centered around the left side. This is a constant pain but has been intensifying for the past few hours.   Jaclyn Haynes does not have a history of hypercholesterolemia, hypertension, or chest pain. She does have a history of heart disease in the family, including a father who passed away at age 28. She has not had a workup for the chest pain but in the past but has gone to the ED in the past according to the patient. Her last EKG was one year ago. She did wear a Holter monitor and then did a stress test At some point which was unrevealing. The chest pain at that time was on her right side, however.  Otherwise, there have been no changes to the past medical history, surgical history, family history, or social history. She does have FMLA paperwork that needs to be filled out.     Review of Systems: a 14-point review of systems is pertinent for what is mentioned in HPI.  Otherwise, all other systems were negative. Constitutional: negative other than that listed in the HPI Eyes: negative other than that listed in the HPI Ears, nose, mouth, throat, and face: negative other than that listed in the HPI Respiratory: negative other than that listed in the HPI Cardiovascular: negative other than that listed in the HPI Gastrointestinal: negative other than that listed in the HPI Genitourinary: negative other than that listed in the HPI Integument: negative other than that listed in the HPI Hematologic: negative other than that listed in the HPI Musculoskeletal: negative other than that listed in the HPI Neurological: negative other than that listed in the HPI Allergy/Immunologic: negative other than that listed in the HPI    Objective:   Blood pressure 120/72, pulse 88, temperature 98.8 F (37.1 C),  temperature source Oral, resp. rate 16, last menstrual period 01/20/2016, SpO2 98 %. There is no height or weight on file to calculate BMI.   Physical Exam:  General: Alert, interactive, in no acute distress. Very pleasant and smiling.  HEENT: TMs pearly gray, turbinates minimally edematous without discharge, post-pharynx mildly erythematous. Neck: Supple without thyromegaly. Adenopathy: no enlarged lymph nodes appreciated in the anterior cervical, occipital, axillary, epitrochlear, inguinal, or popliteal regions Lungs: Clear to auscultation without wheezing, rhonchi or rales. No increased work of breathing. There is tenderness near sternum on the left side of her chest. Pain worsens with palpation.  CV: Normal S1, S2 without murmurs. Capillary refill <2 seconds. Pulses 2+.  Skin: Warm and dry, without lesions or rashes. Extremities:  No clubbing, cyanosis or edema. Neuro:   Grossly intact. No focal deficits noted.   Diagnostic studies:  Spirometry: results normal (FEV1: 1.91/82%, FVC: 2.67/96%, FEV1/FVC: 71%).    Spirometry consistent with normal pattern.flow volume loop appears normal without evidence scooping. She did cough at the end of her expiration    Fara Olden  Ernst Bowler, MD [facility]

## 2016-06-01 NOTE — Patient Instructions (Signed)
1. Moderate persistent asthma, uncomplicated - Continue Symbicort 160/4.5 two puffs twice daily.  2. Swelling of both lips - Carry your EpiPen with you at all times. - Script sent in  3. Perennial allergic rhinitis - We will get blood testing for environmental allergens.  - You can restart antihistamine (cetirizine 10mg  daily) - You can increase the cetirizine to 2-3 times daily if you are having breakthrough itching/swelling.  4. Adverse food reaction, subsequent encounter - We will get food testing via blood (including tomatoes) and the major allergens.  5. Chest pain - Please go to see your PCP for this. - With your family history, I am concerned that something more serious might be happening.  6. Return in about 3 months (around 08/31/2016).  Please inform us of any Emergency Department visits, hospitalizations, or changes in symptoms. Call us before going to the ED for breathing or allergy symptoms since we might be able to fit you in for a sick visit. Feel free to contact us anytime with any questions, problems, or concerns.  It was a pleasure to see you again today!

## 2016-06-02 ENCOUNTER — Telehealth: Payer: Self-pay | Admitting: *Deleted

## 2016-06-02 LAB — CP584 ZONE 3
ALLERGEN, COMM SILVER BIRCH, T3: 0.16 kU/L — AB
ALLERGEN, D PTERNOYSSINUS, D1: 1.84 kU/L — AB
ALLERGEN, MULBERRY, T70: 0.11 kU/L — AB
ALLERGEN, OAK, T7: 0.25 kU/L — AB
ALLERGEN,BLACK LOCUST, ACACIA9: 0.17 kU/L — AB
ASPERGILLUS FUMIGATUS M3: 0.12 kU/L — AB
Allergen, A. alternata, m6: <0.1 kU/L
Allergen, C. Herbarum, M2: <0.1 kU/L
Allergen, Cedar tree, t12: 0.19 kU/L — ABNORMAL HIGH
Allergen, Mucor Racemosus, M4: <0.1 kU/L
Allergen, P. notatum, m1: 0.31 kU/L — ABNORMAL HIGH
BAHIA GRASS: 3.53 kU/L — AB
BERMUDA GRASS: 2.56 kU/L — AB
BOX ELDER: 0.33 kU/L — AB
COCKROACH: 0.19 kU/L — AB
Cat Dander: 0.32 kU/L — ABNORMAL HIGH
Common Ragweed: 0.37 kU/L — ABNORMAL HIGH
D. FARINAE: 2.65 kU/L — AB
DOG DANDER: 0.17 kU/L — AB
Elm IgE: 0.35 kU/L — ABNORMAL HIGH
Johnson Grass: 2.81 kU/L — ABNORMAL HIGH
Meadow Grass: 12.1 kU/L — ABNORMAL HIGH
NETTLE: 0.2 kU/L — AB
Pecan/Hickory Tree IgE: 0.16 kU/L — ABNORMAL HIGH
Plantain: 0.35 kU/L — ABNORMAL HIGH
ROUGH PIGWEED IGE: 0.17 kU/L — AB

## 2016-06-02 LAB — ALLERGY PANEL 18, NUT MIX GROUP
ALMONDS: 0.14 kU/L — AB
COCONUT: 0.2 kU/L — AB
Cashew IgE: 0.21 kU/L — ABNORMAL HIGH
Hazelnut: 0.32 kU/L — ABNORMAL HIGH
PEANUT IGE: 0.45 kU/L — AB
Pecan Nut: <0.1 kU/L
Sesame Seed f10: 0.28 kU/L — ABNORMAL HIGH

## 2016-06-02 LAB — ALLERGY-SHELLFISH PANEL
Clams: <0.1 kU/L
Crab: <0.1 kU/L
Lobster: <0.1 kU/L
Shrimp IgE: 0.16 kU/L — ABNORMAL HIGH

## 2016-06-02 LAB — ALLERGEN, BRAZIL NUT, F18

## 2016-06-02 LAB — CP658 FISH PANEL
Allergen, Flounder, Rf337: <0.1 kU/L
Allergen, Salmon, f41: <0.1 kU/L

## 2016-06-02 LAB — ALLERGEN, TOMATO F25: Tomato IgE: 0.35 kU/L — ABNORMAL HIGH

## 2016-06-02 NOTE — Telephone Encounter (Signed)
Left message for patient to call office.  FMLA papers are complete.

## 2016-06-02 NOTE — Telephone Encounter (Signed)
Patient came to office and picked up her completed FMLA forms.  Copy of completed forms made for office.

## 2016-06-05 LAB — ALLERGEN, WALNUT ENGLISH, IGE: CLASS: 0

## 2016-06-06 ENCOUNTER — Telehealth: Payer: Self-pay

## 2016-06-06 DIAGNOSIS — J309 Allergic rhinitis, unspecified: Secondary | ICD-10-CM

## 2016-06-06 NOTE — Telephone Encounter (Signed)
Yes she can sign it first allergy injection appointment

## 2016-06-06 NOTE — Telephone Encounter (Signed)
Pt would like to start allergy injections? Is it ok to schedule her to start injections?

## 2016-06-06 NOTE — Telephone Encounter (Signed)
That is fine. I will write the script. Can we sign the Consent at the first visit?   Salvatore Marvel, MD [facility] [place]

## 2016-06-07 DIAGNOSIS — J301 Allergic rhinitis due to pollen: Secondary | ICD-10-CM | POA: Diagnosis not present

## 2016-06-07 NOTE — Addendum Note (Signed)
Addended by: Valentina Shaggy on: 06/07/2016 11:52 AM   Modules accepted: Orders

## 2016-06-07 NOTE — Progress Notes (Signed)
Vials to be made 06-08-16.  JM

## 2016-06-07 NOTE — Telephone Encounter (Signed)
Scripts written for immunotherapy. Jaclyn Haynes will sign the Consent Forms when she shows up for her first shot.   Salvatore Marvel, MD [facility] [place]

## 2016-06-08 DIAGNOSIS — J3089 Other allergic rhinitis: Secondary | ICD-10-CM | POA: Diagnosis not present

## 2016-06-12 NOTE — Discharge Summary (Addendum)
Physician Discharge Summary  Jaclyn Haynes [id number] DOB: 1971/03/03 DOA: 05/21/2016  PCP: Lennie Odor, PA-C  Admit date: 05/21/2016 Discharge date: 06/12/2016  Admitted From: home  Disposition:  home    Discharge Condition:  stable   CODE STATUS:  Full code   Diet recommendation:  Heart healthy Consultations:  Phone consult with Allergist    Discharge Diagnoses:  Principal Problem:     Allergic reaction/ Angioedema   Active Problems:   Migraine without aura and without status migrainosus, not intractable    Subjective: No complaints of throat swelling. Facial swelling resolved. No itching.   Brief Summary: 45 y/o female with h/o asthma presents to the ER for numbness and tingling of her upper lip. She developed swelling of her lip once in the ER and was admitted for angioedema.   Hospital Course:  Principal Problem:   Angioedema of lips - etiology undetermined - no new medications  - subsequent to lip swelling, she developed mild swelling of right maxillary area and c/o swelling of throat (no distress noted)-  tongue was not swollen - continued on IV Steroids, Benadryl, Singulair, Claritin and Pepcid- added Hydroxyzine - added C1 esterase inhibitor injection on 8/28 with subsequent improvement noted  - transitioned to prednisone taper - plan discussed with allergist- Dr Neldon Mc whom she will follow up with after discharge to help determine etiology of her allegry  Active Problems:   Migraine without aura and without status migrainosus, not intractable - resolved- uses Topamax only PRN  Asthma - stable- cont Singulair, Dulera  HTN - Toprol   H/o Herpes -   Valtrex    Discharge Instructions  Discharge Instructions    Diet general    Complete by:  As directed    Discharge instructions    Complete by:  As directed    It is important that you read following instructions as well as go over your medication list with RN to help you understand your  care after this hospitalization.  Discharge Instructions: Please follow-up with PCP in one week  Please request your primary care physician to go over all Hospital Tests and Procedure/Radiological results at the follow up,  Please get all Hospital records sent to your PCP by signing hospital release before you go home.   You were cared for by a hospitalist during your hospital stay. If you have any questions about your discharge medications or the care you received while you were in the hospital after you are discharged, you can call the unit and ask to speak with the hospitalist on call if the hospitalist that took care of you is not available.  Once you are discharged, your primary care physician will handle any further medical issues. Please note that NO REFILLS for any discharge medications will be authorized once you are discharged, as it is imperative that you return to your primary care physician (or establish a relationship with a primary care physician if you do not have one) for your aftercare needs so that they can reassess your need for medications and monitor your lab values. You Must read complete instructions/literature along with all the possible adverse reactions/side effects for all the Medicines you take and that have been prescribed to you. Take any new Medicines after you have completely understood and accept all the possible adverse reactions/side effects. Wear Seat belts while driving. If you have smoked or chewed Tobacco in the last 2 yrs please stop smoking and/or stop any Recreational drug use.   Increase activity  slowly    Complete by:  As directed        Medication List    STOP taking these medications   AMETHIA 0.15-0.03 &0.01 MG tablet Generic drug:  Levonorgestrel-Ethinyl Estradiol   CALCIUM 600 PO   GLUCOSAMINE CHOND COMPLEX/MSM PO   GLUCOSAMINE CHONDR COMPLEX PO   meloxicam 7.5 MG tablet Commonly known as:  MOBIC   multivitamin tablet   naratriptan  2.5 MG tablet Commonly known as:  AMERGE   ranitidine 150 MG tablet Commonly known as:  ZANTAC     TAKE these medications   acetaminophen 500 MG tablet Commonly known as:  TYLENOL Take 1,000 mg by mouth every 6 (six) hours as needed for moderate pain.   albuterol 108 (90 Base) MCG/ACT inhaler Commonly known as:  PROVENTIL HFA;VENTOLIN HFA Inhale 2 puffs into the lungs every 6 (six) hours as needed for wheezing.   diphenhydrAMINE 25 mg capsule Commonly known as:  BENADRYL Take 1 capsule (25 mg total) by mouth every 6 (six) hours.   famotidine 20 MG tablet Commonly known as:  PEPCID Take 1 tablet (20 mg total) by mouth 2 (two) times daily.   ferrous sulfate 325 (65 FE) MG tablet Take 1 tablet (325 mg total) by mouth daily with breakfast. Hold until Recommended to take by allergy specialist. What changed:  additional instructions   FLUoxetine 20 MG tablet Commonly known as:  PROZAC Take 20 mg by mouth daily.   fluticasone 50 MCG/ACT nasal spray Commonly known as:  FLONASE SHAKE WELL AND U 2 SPRAYS IEN QD PRN for sinus drainage or stuffiness   Fluticasone-Salmeterol 250-50 MCG/DOSE Aepb Commonly known as:  ADVAIR Inhale 1 puff into the lungs daily.   levocetirizine 5 MG tablet Commonly known as:  XYZAL Take 5 mg by mouth every evening.   metoprolol succinate 25 MG 24 hr tablet Commonly known as:  TOPROL-XL TAKE 1 TABLET BY MOUTH ONCE DAILY FOR PALPITATION   montelukast 10 MG tablet Commonly known as:  SINGULAIR TK 1 T PO QD IN THE EVENING   omeprazole 40 MG capsule Commonly known as:  PRILOSEC Take 40 mg by mouth daily as needed (acid reflux).   predniSONE 10 MG tablet Commonly known as:  DELTASONE Take 50mg  daily for 3days,Take 40mg  daily for 3days,Take 30mg  daily for 3days,Take 20mg  daily for 3days,Take 10mg  daily for 3days, then stop.   PROBIOTIC DAILY Caps Hold until Recommended to take by allergy specialist. What changed:  how to take  this  additional instructions   topiramate 50 MG tablet Commonly known as:  TOPAMAX One tab po qam and 2 tabs po qhs   valACYclovir 500 MG tablet Commonly known as:  VALTREX Take 1 tablet (500 mg total) by mouth daily. Hold until Recommended to take by allergy specialist. What changed:  additional instructions      Follow-up Information    REDMON,NOELLE, PA-C. Schedule an appointment as soon as possible for a visit in 1 week(s).   Specialty:  Nurse Practitioner Contact information: [address]  [address] [phone]        ERIC Kevan Rosebush, MD. Schedule an appointment as soon as possible for a visit in 2 week(s).   Specialty:  Allergy and Immunology Contact information: [place] [address] [phone]          Allergies  Allergen Reactions  . Bee Venom Hives and Swelling    Swelling , more than localized  . Peanut-Containing  Drug Products Hives  . Tomato Hives    Only raw tomato causes reaction     Procedures/Studies:  No results found.     Discharge Exam: Vitals:   05/24/16 0437 05/24/16 1525  BP: (!) 145/90 (!) 146/81  Pulse: 71 85  Resp: 16 16  Temp: 98.8 F (37.1 C) 98.7 F (37.1 C)   Vitals:   05/23/16 2117 05/23/16 2130 05/24/16 0437 05/24/16 1525  BP:  127/86 (!) 145/90 (!) 146/81  Pulse:  84 71 85  Resp:  16 16 16   Temp:  98.9 F (37.2 C) 98.8 F (37.1 C) 98.7 F (37.1 C)  TempSrc:  Oral Oral Oral  SpO2: 95% 97% 98% 97%  Weight:      Height:        General: Pt is alert, awake, not in acute distress Cardiovascular: RRR, S1/S2 +, no rubs, no gallops Respiratory: CTA bilaterally, no wheezing, no rhonchi Abdominal: Soft, NT, ND, bowel sounds + Extremities: no edema, no cyanosis    The results of significant diagnostics from this hospitalization (including imaging, microbiology, ancillary and laboratory) are listed below for reference.     Microbiology: No results found  for this or any previous visit (from the past 240 hour(s)).   Labs: BNP (last 3 results) No results for input(s): BNP in the last 8760 hours. Basic Metabolic Panel: No results for input(s): NA, K, CL, CO2, GLUCOSE, BUN, CREATININE, CALCIUM, MG, PHOS in the last 168 hours. Liver Function Tests: No results for input(s): AST, ALT, ALKPHOS, BILITOT, PROT, ALBUMIN in the last 168 hours. No results for input(s): LIPASE, AMYLASE in the last 168 hours. No results for input(s): AMMONIA in the last 168 hours. CBC: No results for input(s): WBC, NEUTROABS, HGB, HCT, MCV, PLT in the last 168 hours. Cardiac Enzymes: No results for input(s): CKTOTAL, CKMB, CKMBINDEX, TROPONINI in the last 168 hours. BNP: Invalid input(s): POCBNP CBG: No results for input(s): GLUCAP in the last 168 hours. D-Dimer No results for input(s): DDIMER in the last 72 hours. Hgb A1c No results for input(s): HGBA1C in the last 72 hours. Lipid Profile No results for input(s): CHOL, HDL, LDLCALC, TRIG, CHOLHDL, LDLDIRECT in the last 72 hours. Thyroid function studies No results for input(s): TSH, T4TOTAL, T3FREE, THYROIDAB in the last 72 hours.  Invalid input(s): FREET3 Anemia work up No results for input(s): VITAMINB12, FOLATE, FERRITIN, TIBC, IRON, RETICCTPCT in the last 72 hours. Urinalysis No results found for: COLORURINE, APPEARANCEUR, LABSPEC, [place], GLUCOSEU, HGBUR, BILIRUBINUR, KETONESUR, PROTEINUR, UROBILINOGEN, NITRITE, LEUKOCYTESUR Sepsis Labs Invalid input(s): PROCALCITONIN,  WBC,  LACTICIDVEN Microbiology No results found for this or any previous visit (from the past 240 hour(s)).   Time coordinating discharge: Over 30 minutes  SIGNED:   Debbe Odea, MD  [facility] 06/12/2016, 9:34 AM Pager   If 7PM-7AM, please contact night-coverage [email or web address] Password [email or web address]

## 2016-07-27 ENCOUNTER — Ambulatory Visit (INDEPENDENT_AMBULATORY_CARE_PROVIDER_SITE_OTHER): Payer: 59 | Admitting: Physician Assistant

## 2016-07-27 VITALS — BP 144/84 | HR 83 | Temp 98.3°F | Resp 16 | Ht 65.25 in | Wt 250.0 lb

## 2016-07-27 DIAGNOSIS — J019 Acute sinusitis, unspecified: Secondary | ICD-10-CM

## 2016-07-27 MED ORDER — GUAIFENESIN ER 1200 MG PO TB12: 1.0000 | ORAL_TABLET | Freq: Two times a day (BID) | ORAL | 1 refills | Status: DC | PRN

## 2016-07-27 MED ORDER — PREDNISONE 20 MG PO TABS: 20.0000 mg | ORAL_TABLET | Freq: Every day | ORAL | 0 refills | Status: AC

## 2016-07-27 MED ORDER — AMOXICILLIN-POT CLAVULANATE 875-125 MG PO TABS: 1.0000 | ORAL_TABLET | Freq: Two times a day (BID) | ORAL | 0 refills | Status: AC

## 2016-07-27 NOTE — Patient Instructions (Addendum)
Please hydrate well with 64 ounces of water. You can try to use a nasal rinse. Please follow the instructions. You can use a probiotic supplement or eat yogurt while taking this antibiotic, Augmentin. Use warm compresses at your nostril. This can be 3 times a day for 15 minutes.   Sinusitis, Adult Sinusitis is redness, soreness, and inflammation of the paranasal sinuses. Paranasal sinuses are air pockets within the bones of your face. They are located beneath your eyes, in the middle of your forehead, and above your eyes. In healthy paranasal sinuses, mucus is able to drain out, and air is able to circulate through them by way of your nose. However, when your paranasal sinuses are inflamed, mucus and air can become trapped. This can allow bacteria and other germs to grow and cause infection. Sinusitis can develop quickly and last only a short time (acute) or continue over a long period (chronic). Sinusitis that lasts for more than 12 weeks is considered chronic. CAUSES Causes of sinusitis include:  Allergies.  Structural abnormalities, such as displacement of the cartilage that separates your nostrils (deviated septum), which can decrease the air flow through your nose and sinuses and affect sinus drainage.  Functional abnormalities, such as when the small hairs (cilia) that line your sinuses and help remove mucus do not work properly or are not present. SIGNS AND SYMPTOMS Symptoms of acute and chronic sinusitis are the same. The primary symptoms are pain and pressure around the affected sinuses. Other symptoms include:  Upper toothache.  Earache.  Headache.  Bad breath.  Decreased sense of smell and taste.  A cough, which worsens when you are lying flat.  Fatigue.  Fever.  Thick drainage from your nose, which often is green and may contain pus (purulent).  Swelling and warmth over the affected sinuses. DIAGNOSIS Your health care provider will perform a physical exam. During  your exam, your health care provider may perform any of the following to help determine if you have acute sinusitis or chronic sinusitis:  Look in your nose for signs of abnormal growths in your nostrils (nasal polyps).  Tap over the affected sinus to check for signs of infection.  View the inside of your sinuses using an imaging device that has a light attached (endoscope). If your health care provider suspects that you have chronic sinusitis, one or more of the following tests may be recommended:  Allergy tests.  Nasal culture. A sample of mucus is taken from your nose, sent to a lab, and screened for bacteria.  Nasal cytology. A sample of mucus is taken from your nose and examined by your health care provider to determine if your sinusitis is related to an allergy. TREATMENT Most cases of acute sinusitis are related to a viral infection and will resolve on their own within 10 days. Sometimes, medicines are prescribed to help relieve symptoms of both acute and chronic sinusitis. These may include pain medicines, decongestants, nasal steroid sprays, or saline sprays. However, for sinusitis related to a bacterial infection, your health care provider will prescribe antibiotic medicines. These are medicines that will help kill the bacteria causing the infection. Rarely, sinusitis is caused by a fungal infection. In these cases, your health care provider will prescribe antifungal medicine. For some cases of chronic sinusitis, surgery is needed. Generally, these are cases in which sinusitis recurs more than 3 times per year, despite other treatments. HOME CARE INSTRUCTIONS  Drink plenty of water. Water helps thin the mucus so your sinuses can  drain more easily.  Use a humidifier.  Inhale steam 3-4 times a day (for example, sit in the bathroom with the shower running).  Apply a warm, moist washcloth to your face 3-4 times a day, or as directed by your health care provider.  Use saline nasal  sprays to help moisten and clean your sinuses.  Take medicines only as directed by your health care provider.  If you were prescribed either an antibiotic or antifungal medicine, finish it all even if you start to feel better. SEEK IMMEDIATE MEDICAL CARE IF:  You have increasing pain or severe headaches.  You have nausea, vomiting, or drowsiness.  You have swelling around your face.  You have vision problems.  You have a stiff neck.  You have difficulty breathing.   This information is not intended to replace advice given to you by your health care provider. Make sure you discuss any questions you have with your health care provider.   Document Released: 09/11/2005 Document Revised: 10/02/2014 Document Reviewed: 09/26/2011 Elsevier Interactive Patient Education 2016 [employer].     IF you received an x-ray today, you will receive an invoice from [facility]. Please contact [facility] at [phone] with questions or concerns regarding your invoice.   IF you received labwork today, you will receive an invoice from [employer]. Please contact [facility] at [phone] with questions or concerns regarding your invoice.   Our billing staff will not be able to assist you with questions regarding bills from these companies.  You will be contacted with the lab results as soon as they are available. The fastest way to get your results is to activate your My Chart account. Instructions are located on the last page of this paperwork. If you have not heard from [place] regarding the results in 2 weeks, please contact this office.

## 2016-07-27 NOTE — Progress Notes (Signed)
[facility] [address] [place] [address] [phone]  Date:  07/27/2016   Name:  Jaclyn Haynes   DOB:  [birth year]   MRN:  [id number]  PCP:  REDMON,NOELLE, PA-C   Chief Complaint  Patient presents with  . Otalgia    Rt ear x 2 weeks ago and gets worse  . eye itching    Rt eye  . Cough   History of Present Illness:  Jaclyn Haynes is a 45 y.o. female patient who presents to [facility] for cc of ear pain, itching eye, and cough for 2 weeks. Patient reports that She has a worsening ear pain on her right side. There is no change in hearing. There is no drainage from the ear. It hurts around the ear.  She is also noticed that her eye is itching. It feels scratchy. It has more drainage on that side.  Her eyes not matted shut. But she has noticed more mucus at the corner of her medial eye. It is watering. It feels scratchy. She has no vision changes. There is some tenderness along the right side of her nose near the medial canthus of the eye she is also noticed that it is swollen. She has attempted Tylenol or ibuprofen. She has no fever. She is coughing it is not associated with shortness of breath or trouble breathing.   Patient Active Problem List   Diagnosis Date Noted  . Angioedema of lips 05/21/2016  . Allergic reaction 05/21/2016  . Palpitation 08/04/2015  . Migraine without aura and without status migrainosus, not intractable 10/27/2014  . Dizziness and giddiness 10/27/2013  . Disturbance of skin sensation 10/27/2013    Past Medical History:  Diagnosis Date  . Abnormal Pap smear    CIN1  . Allergy   . Anemia   . Arthritis   . Asthma   . Bladder infection   . BV (bacterial vaginosis)   . Chlamydia   . Fibroids   . Heart murmur   . Hernia   . HSV-2 infection   . Yeast infection     Past Surgical History:  Procedure Laterality Date  . BREAST BIOPSY    . BREAST LUMPECTOMY Right   . CHOLECYSTECTOMY      Social History  Substance Use  Topics  . Smoking status: Never Smoker  . Smokeless tobacco: Never Used  . Alcohol use No    Family History  Problem Relation Age of Onset  . Stroke Mother   . Hypertension Mother   . COPD Mother   . Thyroid nodules Mother   . Hyperlipidemia Mother   . Heart disease Father   . Hypertension Father   . Hyperlipidemia Father   . Hypertension Maternal Grandmother   . Hyperlipidemia Maternal Grandmother   . Diabetes Maternal Grandmother   . Thyroid nodules Maternal Grandmother   . Hypertension Maternal Grandfather   . Hyperlipidemia Maternal Grandfather   . Prostate cancer Maternal Grandfather   . Hypertension Paternal Grandmother   . Heart disease Paternal Grandfather   . Hypertension Paternal Grandfather     Allergies  Allergen Reactions  . Bee Venom Hives and Swelling    Swelling , more than localized  . Peanut-Containing Drug Products Hives  . Tomato Hives    Only raw tomato causes reaction    Medication list has been reviewed and updated.  Current Outpatient Prescriptions on File Prior to Visit  Medication Sig Dispense Refill  . acetaminophen (TYLENOL) 500  MG tablet Take 1,000 mg by mouth every 6 (six) hours as needed for moderate pain.    Marland Kitchen albuterol (PROVENTIL HFA;VENTOLIN HFA) 108 (90 BASE) MCG/ACT inhaler Inhale 2 puffs into the lungs every 6 (six) hours as needed for wheezing.    . budesonide-formoterol (SYMBICORT) 160-4.5 MCG/ACT inhaler Inhale 2 puffs into the lungs 2 (two) times daily. 1 Inhaler 5  . diphenhydrAMINE (BENADRYL) 25 mg capsule Take 1 capsule (25 mg total) by mouth every 6 (six) hours. 30 capsule 0  . famotidine (PEPCID) 20 MG tablet Take 1 tablet (20 mg total) by mouth 2 (two) times daily. 30 tablet 0  . ferrous sulfate 325 (65 FE) MG tablet Take 1 tablet (325 mg total) by mouth daily with breakfast. Hold until Recommended to take by allergy specialist.  3  . FLUoxetine (PROZAC) 20 MG tablet Take 20 mg by mouth daily.    . fluticasone (FLONASE) 50  MCG/ACT nasal spray SHAKE WELL AND U 2 SPRAYS IEN QD PRN for sinus drainage or stuffiness  6  . levocetirizine (XYZAL) 5 MG tablet Take 5 mg by mouth every evening.     . metoprolol succinate (TOPROL-XL) 25 MG 24 hr tablet TAKE 1 TABLET BY MOUTH ONCE DAILY FOR PALPITATION  3  . montelukast (SINGULAIR) 10 MG tablet TK 1 T PO QD IN THE EVENING  5  . omeprazole (PRILOSEC) 40 MG capsule Take 40 mg by mouth daily as needed (acid reflux).     . Probiotic Product (PROBIOTIC DAILY) CAPS Hold until Recommended to take by allergy specialist.    . topiramate (TOPAMAX) 50 MG tablet One tab po qam and 2 tabs po qhs 90 tablet 11  . valACYclovir (VALTREX) 500 MG tablet Take 1 tablet (500 mg total) by mouth daily. Hold until Recommended to take by allergy specialist.    . Fluticasone-Salmeterol (ADVAIR) 250-50 MCG/DOSE AEPB Inhale 1 puff into the lungs daily.      No current facility-administered medications on file prior to visit.     ROS ROS otherwise unremarkable unless listed above.  Physical Examination: BP (!) 144/84 (BP Location: Right Arm, Patient Position: Sitting, Cuff Size: Large)   Pulse 83   Temp 98.3 F (36.8 C) (Oral)   Resp 16   Ht 5' 5.25" (1.657 m)   Wt 250 lb (113.4 kg)   SpO2 96%   BMI 41.28 kg/m  Ideal Body Weight: Weight in (lb) to have BMI = 25: 151.1  Physical Exam  Constitutional: She is oriented to person, place, and time. She appears well-developed and well-nourished. No distress.  HENT:  Head: Normocephalic and atraumatic.  Right Ear: Tympanic membrane, external ear and ear canal normal.  Left Ear: Tympanic membrane, external ear and ear canal normal.  Nose: Mucosal edema and rhinorrhea present. Right sinus exhibits no maxillary sinus tenderness and no frontal sinus tenderness. Left sinus exhibits no maxillary sinus tenderness and no frontal sinus tenderness.  Mouth/Throat: No uvula swelling. Posterior oropharyngeal erythema (Mild) present. No oropharyngeal exudate or  posterior oropharyngeal edema.  Right here has minimal fluid behind the ear. There is erythema surrounding the TM however it is not injected. There is tenderness at the base and the anterior of the canal.  Eyes: Conjunctivae and EOM are normal. Pupils are equal, round, and reactive to light.  Cardiovascular: Normal rate and regular rhythm.  Exam reveals no gallop, no distant heart sounds and no friction rub.   No murmur heard. Pulmonary/Chest: Effort normal. No respiratory distress. She  has no decreased breath sounds. She has no wheezes. She has no rhonchi.  Lymphadenopathy:       Head (right side): No submandibular, no tonsillar, no preauricular and no posterior auricular adenopathy present.       Head (left side): No submandibular, no tonsillar, no preauricular and no posterior auricular adenopathy present.  Neurological: She is alert and oriented to person, place, and time.  Skin: She is not diaphoretic.  Psychiatric: She has a normal mood and affect. Her behavior is normal.     Assessment and Plan: Quincie R Burningham is a 45 y.o. female who is here today for chief complaint of right ear pain, cough, and eye irritation.  Acute non-recurrent sinusitis, unspecified location - Plan: amoxicillin-clavulanate (AUGMENTIN) 875-125 MG tablet, Guaifenesin (MUCINEX MAXIMUM STRENGTH) 1200 MG TB12  Ivar Drape, PA-C [facility] 11/3/20178:33 AM

## 2016-08-31 ENCOUNTER — Ambulatory Visit (INDEPENDENT_AMBULATORY_CARE_PROVIDER_SITE_OTHER): Payer: 59 | Admitting: Allergy & Immunology

## 2016-08-31 ENCOUNTER — Encounter: Payer: Self-pay | Admitting: Allergy & Immunology

## 2016-08-31 VITALS — BP 138/80 | HR 89 | Temp 99.1°F | Resp 20 | Ht 64.0 in | Wt 251.4 lb

## 2016-08-31 DIAGNOSIS — J4540 Moderate persistent asthma, uncomplicated: Secondary | ICD-10-CM

## 2016-08-31 DIAGNOSIS — T781XXD Other adverse food reactions, not elsewhere classified, subsequent encounter: Secondary | ICD-10-CM | POA: Diagnosis not present

## 2016-08-31 DIAGNOSIS — J3089 Other allergic rhinitis: Secondary | ICD-10-CM

## 2016-08-31 DIAGNOSIS — J019 Acute sinusitis, unspecified: Secondary | ICD-10-CM | POA: Diagnosis not present

## 2016-08-31 MED ORDER — AMOXICILLIN-POT CLAVULANATE 875-125 MG PO TABS: 1.0000 | ORAL_TABLET | Freq: Two times a day (BID) | ORAL | 0 refills | Status: AC

## 2016-08-31 NOTE — Progress Notes (Signed)
FOLLOW UP  Date of Service/Encounter:  08/31/16   Assessment:   Moderate persistent asthma, uncomplicated  Acute sinusitis, recurrence not specified, unspecified location  Perennial allergic rhinitis  Adverse food reaction, subsequent encounter   Asthma Reportables:  Severity: moderate persistent  Risk: low Control: well controlled  Seasonal Influenza Vaccine: yes    Plan/Recommendations:   1. Moderate persistent asthma, uncomplicated - Continue Symbicort 160/4.5 two puffs twice daily. - Lung function looked slightly worse than last time, although it was still in the normal range. - We did provide a sample of Symbicort to help better through the new year. - We did ask that if she needs medications in the future that she come by and get samples.   2. Sinusitis - Start Augmentin 82m twice daily for 14 days. - Stop the Flonase and start Nasacort 2 sprays per nostril once daily. - If you like this, call [facility] we can send in a script for it. - Hopefully starting the allergy shots will help decrease her incidence of sinusitis.   3. Perennial allergic rhinitis - Blood testing showed multiple environmental allergens.  - We will plan to start allergy shots once you call [facility] to confirm that you are interested. - Call your insurance company to find out any copays. - IT Consent signed today.  4. Adverse food reaction, subsequent encounter - Continue to avoid peanuts, tree nuts, shrimp, and tomato. - Take notes on any seasonings that cause reactions and we can test in the future.  5. Return in about 3 months (around 11/29/2016).   Subjective:   Jaclyn R SDubachis a 45y.o. female presenting today for follow up of  Chief Complaint  Patient presents with  . Follow-up    doing good but could be better, only taking 1 puff once a day of the symbicort  . Allergic Reaction    she ate chicken leg,thigh on a bone red rash on the face  .  Jaclyn R SWindholzhas a  history of the following: Patient Active Problem List   Diagnosis Date Noted  . Angioedema of lips 05/21/2016  . Allergic reaction 05/21/2016  . Palpitation 08/04/2015  . Migraine without aura and without status migrainosus, not intractable 10/27/2014  . Dizziness and giddiness 10/27/2013  . Disturbance of skin sensation 10/27/2013    History obtained from: chart review and patient.  Guillermina R SNooriwas referred by REDMON,NOELLE, PA-C.     Jaclyn Haynes is a 45y.o. female presenting for a follow up visit. The patient presents as a follow-up. She was last seen in September 2017. At that time, she was complaining of chest pain and refused going to the ER for an evaluation. We continued her on Symbicort 162 puffs twice daily for asthma. This had improved her hoarseness, secondary to the Advair Diskus. She has a history of idiopathic anaphylaxis and all her blood work has been normal. She has allergic rhinitis with blood allergy testing that was positive to dust mites, cat, dog, weeds, trees, grasses, cockroach, and molds. We did blood food allergy testing that had low positives to tree nuts. Tomato IgE was slightly positive as was shrimp IgE, although they were low positives.   Since the last visit, she has remained stable. However, she reports that she has been her Symbicort to allow the current inhaler to last her longer. She is only using 1 inhalation daily.  Asthma/Respiratory Symptom History: She reports that she has been stretching her Symbicort due to  the cost. She feels that she is breathing a little better than what she was breathing previously prior to her evaluation with [place].. She does have some nighttime coughing and first thing in the morning. She does have reflux and is on ranitidine. She has had no ED visits or UC visits as well. She has been using her ProAir around 3-4 times per week. When she was using her Symbicort as directed, this was much less.   Allergic Rhinitis Symptom History:  She is having some nasal symptoms now for two weeks. This has been going on for two weeks now. She has sinus pressure and difficulty breathing through her nose. She does report burning with the Flonase and is interested in switching to something else. She has never been on any nasal antihistamines. She has been using Nasonex. She does not get sinus infections often at all. She did go to Urgent Care a few weeks ago and was given a sample of a nasal saline rinse kit. She was placed on prednisone for five days. It did help some but she was sick for around one month. She got better for 1-2 weeks and then the symptoms recurred.   Food Allergy Symptom History: The patient continues to avoid peanuts, tree nuts, shrimp, and tomato. Interestingly, she can tolerate other shellfish. Yesterday, she had a chicken leg and chicken thigh with green beans. It was baked chicken and not cooked with peanut. She started having facial tingling and scratching. She took a benadryl. She otherwise tolerates chicken frequently without a problem. She is unsure of the seasonings in the chicken.   Otherwise, there have been no changes to her past medical history, surgical history, family history, or social history.    Review of Systems: a 14-point review of systems is pertinent for what is mentioned in HPI.  Otherwise, all other systems were negative. Constitutional: negative other than that listed in the HPI Eyes: negative other than that listed in the HPI Ears, nose, mouth, throat, and face: negative other than that listed in the HPI Respiratory: negative other than that listed in the HPI Cardiovascular: negative other than that listed in the HPI Gastrointestinal: negative other than that listed in the HPI Genitourinary: negative other than that listed in the HPI Integument: negative other than that listed in the HPI Hematologic: negative other than that listed in the HPI Musculoskeletal: negative other than that listed in  the HPI Neurological: negative other than that listed in the HPI Allergy/Immunologic: negative other than that listed in the HPI    Objective:   Blood pressure 138/80, pulse 89, temperature 99.1 F (37.3 C), temperature source Oral, resp. rate 20, height 5' 4"  (1.626 m), weight 251 lb 6.4 oz (114 kg), SpO2 97 %. Body mass index is 43.15 kg/m.   Physical Exam:  General: Alert, interactive, in no acute distress. Smiling as usual. Cooperative with the exam. Very pleasant and self-effacing.  Eyes: No conjunctival injection present on the right, No conjunctival injection on the left and PERRL bilaterally Ears: Right TM pearly gray with normal light reflex and Left TM pearly gray with normal light reflex.  Nose/Throat: External nose within normal limits, turbinates edematous and pale with clear discharge, post-pharynx markedly erythematous. Neck: Supple without thyromegaly. Lungs: Clear to auscultation without wheezing, rhonchi or rales. No increased work of breathing. CV: Normal S1/S2, no murmurs. Capillary refill <2 seconds.  Abdomen: Nondistended, nontender. No guarding or rebound tenderness. Bowel sounds faint and present in all fields  Skin: Warm  and dry, without lesions or rashes. Extremities:  No clubbing, cyanosis or edema. Neuro:   Grossly intact. No focal deficits appreciated. Responsive to questions.   Diagnostic studies:  Spirometry: results normal (FEV1: 1.93/83%, FVC: 2.34/84%, FEV1/FVC: 82%).    Spirometry consistent with normal pattern. Compared to her last visit, her FVC has decreased from 2.67 while her FEV1 has remained stable.    Salvatore Marvel, MD [facility]

## 2016-08-31 NOTE — Patient Instructions (Addendum)
1. Moderate persistent asthma, uncomplicated - Continue Symbicort 160/4.5 two puffs twice daily. - Lung function looked slightly worse than last time. - We did provide a sample of Symbicort to help better through the new year. - We did ask that if she needs medications in the future that she come by and get samples.   2. Sinusitis - Start Augmentin 875mg  twice daily for 14 days. - Stop the Flonase and start Nasacort 2 sprays per nostril once daily. - If you like this, call us and we can send in a script for it. - Hopefully starting the allergy shots will help decrease her incidence of sinusitis.   3. Perennial allergic rhinitis - Blood testing showed multiple environmental allergens.  - We will plan to start allergy shots once you call us back to confirm that you are interested. - Call your insurance company to find out any copays. - IT Consent signed today.  4. Adverse food reaction, subsequent encounter - Continue to avoid peanuts, tree nuts, shrimp, and tomato. - Take notes on any seasonings that cause reactions and we can test in the future.  5. Return in about 3 months (around 11/29/2016).  Please inform us of any Emergency Department visits, hospitalizations, or changes in symptoms. Call us before going to the ED for breathing or allergy symptoms since we might be able to fit you in for a sick visit. Feel free to contact us anytime with any questions, problems, or concerns.  It was a pleasure to see you again today! Have a wonderful and safe holiday season!

## 2016-09-14 ENCOUNTER — Encounter: Payer: Self-pay | Admitting: Allergy & Immunology

## 2016-10-09 DIAGNOSIS — G44219 Episodic tension-type headache, not intractable: Secondary | ICD-10-CM | POA: Diagnosis not present

## 2016-10-09 DIAGNOSIS — G43009 Migraine without aura, not intractable, without status migrainosus: Secondary | ICD-10-CM | POA: Diagnosis not present

## 2016-10-16 DIAGNOSIS — Z1231 Encounter for screening mammogram for malignant neoplasm of breast: Secondary | ICD-10-CM | POA: Diagnosis not present

## 2016-10-16 DIAGNOSIS — Z01419 Encounter for gynecological examination (general) (routine) without abnormal findings: Secondary | ICD-10-CM | POA: Diagnosis not present

## 2016-10-16 DIAGNOSIS — N760 Acute vaginitis: Secondary | ICD-10-CM | POA: Diagnosis not present

## 2016-10-26 DIAGNOSIS — N926 Irregular menstruation, unspecified: Secondary | ICD-10-CM | POA: Diagnosis not present

## 2016-10-27 ENCOUNTER — Other Ambulatory Visit: Payer: Self-pay | Admitting: Obstetrics and Gynecology

## 2016-10-27 DIAGNOSIS — N926 Irregular menstruation, unspecified: Secondary | ICD-10-CM | POA: Diagnosis not present

## 2016-11-10 ENCOUNTER — Ambulatory Visit (INDEPENDENT_AMBULATORY_CARE_PROVIDER_SITE_OTHER): Payer: 59 | Admitting: Physician Assistant

## 2016-11-10 VITALS — BP 132/82 | HR 90 | Temp 98.5°F | Ht 64.0 in | Wt 253.8 lb

## 2016-11-10 DIAGNOSIS — J019 Acute sinusitis, unspecified: Secondary | ICD-10-CM | POA: Diagnosis not present

## 2016-11-10 MED ORDER — AMOXICILLIN-POT CLAVULANATE 875-125 MG PO TABS: 1.0000 | ORAL_TABLET | Freq: Two times a day (BID) | ORAL | 0 refills | Status: AC

## 2016-11-10 NOTE — Patient Instructions (Addendum)
You can use a nasal rinse at home. Please use Tylenol or ibuprofen for pain and fever. Take the medication as prescribed. Make sure you are hydrating well with 64 oz if not more per day.    Sinusitis, Adult Sinusitis is soreness and inflammation of your sinuses. Sinuses are hollow spaces in the bones around your face. They are located:  Around your eyes.  In the middle of your forehead.  Behind your nose.  In your cheekbones. Your sinuses and nasal passages are lined with a stringy fluid (mucus). Mucus normally drains out of your sinuses. When your nasal tissues get inflamed or swollen, the mucus can get trapped or blocked so air cannot flow through your sinuses. This lets bacteria, viruses, and funguses grow, and that leads to infection. Follow these instructions at home: Medicines  Take, use, or apply over-the-counter and prescription medicines only as told by your doctor. These may include nasal sprays.  If you were prescribed an antibiotic medicine, take it as told by your doctor. Do not stop taking the antibiotic even if you start to feel better. Hydrate and Humidify  Drink enough water to keep your pee (urine) clear or pale yellow.  Use a cool mist humidifier to keep the humidity level in your home above 50%.  Breathe in steam for 10-15 minutes, 3-4 times a day or as told by your doctor. You can do this in the bathroom while a hot shower is running.  Try not to spend time in cool or dry air. Rest  Rest as much as possible.  Sleep with your head raised (elevated).  Make sure to get enough sleep each night. General instructions  Put a warm, moist washcloth on your face 3-4 times a day or as told by your doctor. This will help with discomfort.  Wash your hands often with soap and water. If there is no soap and water, use hand sanitizer.  Do not smoke. Avoid being around people who are smoking (secondhand smoke).  Keep all follow-up visits as told by your doctor. This  is important. Contact a doctor if:  You have a fever.  Your symptoms get worse.  Your symptoms do not get better within 10 days. Get help right away if:  You have a very bad headache.  You cannot stop throwing up (vomiting).  You have pain or swelling around your face or eyes.  You have trouble seeing.  You feel confused.  Your neck is stiff.  You have trouble breathing. This information is not intended to replace advice given to you by your health care provider. Make sure you discuss any questions you have with your health care provider. Document Released: 02/28/2008 Document Revised: 05/07/2016 Document Reviewed: 07/07/2015 Elsevier Interactive Patient Education  2017 [employer].     IF you received an x-ray today, you will receive an invoice from [facility]. Please contact [facility] at [phone] with questions or concerns regarding your invoice.   IF you received labwork today, you will receive an invoice from [facility]. Please contact [facility] at [phone] with questions or concerns regarding your invoice.   Our billing staff will not be able to assist you with questions regarding bills from these companies.  You will be contacted with the lab results as soon as they are available. The fastest way to get your results is to activate your My Chart account. Instructions are located on the last page of this paperwork. If you have not heard from [place] regarding the results  in 2 weeks, please contact this office.

## 2016-11-10 NOTE — Progress Notes (Signed)
[facility] [address] [place] [address] [id number]  Date:  11/10/2016   Name:  Jaclyn Haynes   DOB:  September 07, 1971   MRN:  [id number]  PCP:  REDMON,NOELLE, PA-C    History of Present Illness:  Jaclyn Haynes is a 46 y.o. female patient who presents to [facility]    --4 days of sinus pain.  This includes congestion.  Coughing is mild, and non-productive.  Sinus pain along face.  Mucus is thick.  No sob or dyspnea.  No fever, however subjective along with chills.  No sneezing or watery eyes.  Patient Active Problem List   Diagnosis Date Noted  . Angioedema of lips 05/21/2016  . Allergic reaction 05/21/2016  . Palpitation 08/04/2015  . Migraine without aura and without status migrainosus, not intractable 10/27/2014  . Dizziness and giddiness 10/27/2013  . Disturbance of skin sensation 10/27/2013    Past Medical History:  Diagnosis Date  . Abnormal Pap smear    CIN1  . Allergy   . Anemia   . Arthritis   . Asthma   . Bladder infection   . BV (bacterial vaginosis)   . Chlamydia   . Fibroids   . Heart murmur   . Hernia   . HSV-2 infection   . Yeast infection     Past Surgical History:  Procedure Laterality Date  . BREAST BIOPSY    . BREAST LUMPECTOMY Right   . CHOLECYSTECTOMY      Social History  Substance Use Topics  . Smoking status: Never Smoker  . Smokeless tobacco: Never Used  . Alcohol use No    Family History  Problem Relation Age of Onset  . Stroke Mother   . Hypertension Mother   . COPD Mother   . Thyroid nodules Mother   . Hyperlipidemia Mother   . Eczema Mother   . Heart disease Father   . Hypertension Father   . Hyperlipidemia Father   . Hypertension Maternal Grandmother   . Hyperlipidemia Maternal Grandmother   . Diabetes Maternal Grandmother   . Thyroid nodules Maternal Grandmother   . Hypertension Maternal Grandfather   . Hyperlipidemia Maternal Grandfather   . Prostate cancer Maternal Grandfather   .  Hypertension Paternal Grandmother   . Heart disease Paternal Grandfather   . Hypertension Paternal Grandfather   . Eczema Brother   . Allergic rhinitis Neg Hx   . Angioedema Neg Hx   . Asthma Neg Hx   . Immunodeficiency Neg Hx   . Urticaria Neg Hx     Allergies  Allergen Reactions  . Bee Venom Hives and Swelling    Swelling , more than localized  . Peanut-Containing Drug Products Hives  . Tomato Hives    Only raw tomato causes reaction    Medication list has been reviewed and updated.  Current Outpatient Prescriptions on File Prior to Visit  Medication Sig Dispense Refill  . acetaminophen (TYLENOL) 500 MG tablet Take 1,000 mg by mouth every 6 (six) hours as needed for moderate pain.    Marland Kitchen albuterol (PROVENTIL HFA;VENTOLIN HFA) 108 (90 BASE) MCG/ACT inhaler Inhale 2 puffs into the lungs every 6 (six) hours as needed for wheezing.    . ASHLYNA 0.15-0.03 &0.01 MG tablet Take 0.15 mg by mouth daily.    . budesonide-formoterol (SYMBICORT) 160-4.5 MCG/ACT inhaler Inhale 2 puffs into the lungs 2 (two) times daily. 1 Inhaler 5  . diphenhydrAMINE (BENADRYL) 25 mg capsule Take 1 capsule (25  mg total) by mouth every 6 (six) hours. 30 capsule 0  . famotidine (PEPCID) 20 MG tablet Take 1 tablet (20 mg total) by mouth 2 (two) times daily. 30 tablet 0  . ferrous sulfate 325 (65 FE) MG tablet Take 1 tablet (325 mg total) by mouth daily with breakfast. Hold until Recommended to take by allergy specialist.  3  . FLUoxetine (PROZAC) 20 MG tablet Take 20 mg by mouth daily.    . fluticasone (FLONASE) 50 MCG/ACT nasal spray SHAKE WELL AND U 2 SPRAYS IEN QD PRN for sinus drainage or stuffiness  6  . Fluticasone-Salmeterol (ADVAIR) 250-50 MCG/DOSE AEPB Inhale 1 puff into the lungs daily.     . Guaifenesin (MUCINEX MAXIMUM STRENGTH) 1200 MG TB12 Take 1 tablet (1,200 mg total) by mouth every 12 (twelve) hours as needed. 14 tablet 1  . levocetirizine (XYZAL) 5 MG tablet Take 5 mg by mouth every evening.      . metoprolol succinate (TOPROL-XL) 25 MG 24 hr tablet TAKE 1 TABLET BY MOUTH ONCE DAILY FOR PALPITATION  3  . montelukast (SINGULAIR) 10 MG tablet TK 1 T PO QD IN THE EVENING  5  . omeprazole (PRILOSEC) 40 MG capsule Take 40 mg by mouth daily as needed (acid reflux).     . Probiotic Product (PROBIOTIC DAILY) CAPS Hold until Recommended to take by allergy specialist.    . ranitidine (ZANTAC) 150 MG tablet Take 150 mg by mouth daily.  2  . topiramate (TOPAMAX) 50 MG tablet One tab po qam and 2 tabs po qhs 90 tablet 11  . valACYclovir (VALTREX) 500 MG tablet Take 1 tablet (500 mg total) by mouth daily. Hold until Recommended to take by allergy specialist.     No current facility-administered medications on file prior to visit.     ROS ROS otherwise unremarkable unless listed above.   Physical Examination: BP 132/82 (BP Location: Right Arm, Patient Position: Sitting, Cuff Size: Large)   Pulse 90   Temp 98.5 F (36.9 C) (Oral)   Ht 5\' 4"  (1.626 m)   Wt 253 lb 12.8 oz (115.1 kg)   LMP 10/24/2016 (Approximate)   SpO2 99%   BMI 43.56 kg/m  Ideal Body Weight: Weight in (lb) to have BMI = 25: 145.3  Physical Exam  Constitutional: She is oriented to person, place, and time. She appears well-developed and well-nourished. No distress.  HENT:  Head: Normocephalic and atraumatic.  Right Ear: Tympanic membrane, external ear and ear canal normal.  Left Ear: Tympanic membrane, external ear and ear canal normal.  Nose: Mucosal edema and rhinorrhea present. Right sinus exhibits maxillary sinus tenderness. Right sinus exhibits no frontal sinus tenderness. Left sinus exhibits maxillary sinus tenderness. Left sinus exhibits no frontal sinus tenderness.  Mouth/Throat: No uvula swelling. No oropharyngeal exudate, posterior oropharyngeal edema or posterior oropharyngeal erythema.  Eyes: Conjunctivae and EOM are normal. Pupils are equal, round, and reactive to light.  Cardiovascular: Normal rate and  regular rhythm.  Exam reveals no gallop, no distant heart sounds and no friction rub.   No murmur heard. Pulmonary/Chest: Effort normal. No respiratory distress. She has no decreased breath sounds. She has no wheezes. She has no rhonchi.  Lymphadenopathy:       Head (right side): No submandibular, no tonsillar, no preauricular and no posterior auricular adenopathy present.       Head (left side): No submandibular, no tonsillar, no preauricular and no posterior auricular adenopathy present.  Neurological: She is alert and oriented  to person, place, and time.  Skin: She is not diaphoretic.  Psychiatric: She has a normal mood and affect. Her behavior is normal.     Assessment and Plan: Aariyah R Rake is a 46 y.o. female who is here today for sinus pressure and congestion Advised nasal saline rinse. NSAID use for pain. advised hydration. Acute non-recurrent sinusitis, unspecified location - Plan: amoxicillin-clavulanate (AUGMENTIN) 875-125 MG tablet  Ivar Drape, PA-C [facility] 3/18/20187:24 PM

## 2016-11-29 ENCOUNTER — Ambulatory Visit (INDEPENDENT_AMBULATORY_CARE_PROVIDER_SITE_OTHER): Payer: 59 | Admitting: Allergy & Immunology

## 2016-11-29 ENCOUNTER — Encounter: Payer: Self-pay | Admitting: Allergy & Immunology

## 2016-11-29 VITALS — BP 118/72 | HR 80 | Temp 98.3°F

## 2016-11-29 DIAGNOSIS — J3089 Other allergic rhinitis: Secondary | ICD-10-CM

## 2016-11-29 DIAGNOSIS — J019 Acute sinusitis, unspecified: Secondary | ICD-10-CM

## 2016-11-29 DIAGNOSIS — T781XXD Other adverse food reactions, not elsewhere classified, subsequent encounter: Secondary | ICD-10-CM

## 2016-11-29 DIAGNOSIS — J4540 Moderate persistent asthma, uncomplicated: Secondary | ICD-10-CM

## 2016-11-29 NOTE — Progress Notes (Signed)
FOLLOW UP  Date of Service/Encounter:  11/29/16   Assessment:   Adverse food reaction  Acute sinusitis  Perennial allergic rhinitis  Moderate persistent asthma, uncomplicated   Asthma Reportables:  Severity: moderate persistent  Risk: low Control: well controlled    Plan/Recommendations:   1. Moderate persistent asthma, uncomplicated - We will not make any medication changes since you are doing well. - Daily controller medication(s): Symbicort 160/4.5 two puffs twice daily with spacer - Rescue medications: ProAir 4 puffs every 4-6 hours as needed - Asthma control goals:  * Full participation in all desired activities (may need albuterol before activity) * Albuterol use two time or less a week on average (not counting use with activity) * Cough interfering with sleep two time or less a month * Oral steroids no more than once a year * No hospitalizations  2. Sinusitis - likely viral at this time - Restart Mucinex to see if this helps.  - Continue with nasal saline rinses. - Continue with the nasal steroid.  3. Perennial allergic rhinitis (dust mite, cat, dog, weeds, trees, grasses, cockroach, and molds) - Continue with nasal steroid for now. - She has failed Flonase and Nasacort. - Nasonex has worked for her in the past but the copay is $50.  - Unfortunately we do not have samples of Nasonex in the clinic today.  4. Adverse food reaction - Continue to avoid peanuts, tree nuts, shrimp, and tomato. - Take notes on any seasonings that cause reactions and we can test in the future. - EpiPen is up to date.  5. Return in about 3 months (around 03/01/2017).   Subjective:   Jaclyn Haynes is a 46 y.o. female presenting today for follow up of  Chief Complaint  Patient presents with  . Asthma    well controlled with Symbicort [name] has a history of the following: Patient Active Problem List   Diagnosis Date Noted  . Angioedema of lips  05/21/2016  . Allergic reaction 05/21/2016  . Palpitation 08/04/2015  . Migraine without aura and without status migrainosus, not intractable 10/27/2014  . Dizziness and giddiness 10/27/2013  . Disturbance of skin sensation 10/27/2013    History obtained from: chart review and patient.  Jaclyn Haynes was referred by REDMON,NOELLE, PA-C.     Jaclyn Haynes is a 46 y.o. female presenting for a follow up visit. She was last seen in December 2017 for follow-up visit. At that time, she had recently been started on Symbicort 160/4.5. Her lung function looked fairly good, so we continued her on this. She had recently had blood testing that showed positive results to multiple allergens including dust mite, cat, dog, weeds, trees, grasses, cockroach, and molds. We had discussed starting allergy shots, but she has not done this yet. We instead started Nasonex 1-2 sprays per nostril daily. She has failed Flonase and Nasacort in the past. She also has a history of an adverse food reaction to an unknown trigger. Testing has been positive to peanuts, tree nuts, shrimp, and tomato.  Since the last visit, she has done well. Jaclyn Haynes's asthma has been well controlled. She has not required rescue medication, experienced nocturnal awakenings due to lower respiratory symptoms, nor have activities of daily living been limited. She has had no ED visits or UC visits for her asthma. She denies courses of prednisone.   Allergic rhinitis has been well controlled. However, over the last three days, she has had sinus pressure. She  has been afebrile and denies discharge. She does have some sneezing. The pressure is worst in the mornings.   [name] did have another episode at work when she ate a red velvet strawberry cupcake. This was filled with a fruit preserve in the middle. She has tolerated all of the ingredients in the past. She took benadryl with improvement in the symptoms. At the time, she also had one chicken wing and a  roll as well; the wings were not cooked in peanut oil. She has otherwise continued to avoid shrimp, tomato, and shellfish. She has not needed to use her epinephrine.   Otherwise, there have been no changes to her past medical history, surgical history, family history, or social history.    Review of Systems: a 14-point review of systems is pertinent for what is mentioned in HPI.  Otherwise, all other systems were negative. Constitutional: negative other than that listed in the HPI Eyes: negative other than that listed in the HPI Ears, nose, mouth, throat, and face: negative other than that listed in the HPI Respiratory: negative other than that listed in the HPI Cardiovascular: negative other than that listed in the HPI Gastrointestinal: negative other than that listed in the HPI Genitourinary: negative other than that listed in the HPI Integument: negative other than that listed in the HPI Hematologic: negative other than that listed in the HPI Musculoskeletal: negative other than that listed in the HPI Neurological: negative other than that listed in the HPI Allergy/Immunologic: negative other than that listed in the HPI    Objective:   Blood pressure 118/72, pulse 80, temperature 98.3 F (36.8 C), temperature source Oral, SpO2 97 %. There is no height or weight on file to calculate BMI.   Physical Exam:  General: Alert, interactive, in no acute distress. Cooperative with the exam. Pleasant.  Eyes: No conjunctival injection present on the right, No conjunctival injection present on the left, PERRL bilaterally, No discharge on the right, No discharge on the left and No Horner-Trantas dots present Ears: Right TM pearly gray with normal light reflex, Left TM pearly gray with normal light reflex, Right TM intact without perforation and Left TM intact without perforation.  Nose/Throat: External nose within normal limits and septum midline, turbinates edematous with clear discharge,  post-pharynx mildly erythematous without cobblestoning in the posterior oropharynx. Tonsils 2+ without exudates Neck: Supple without thyromegaly. Lungs: Clear to auscultation without wheezing, rhonchi or rales. No increased work of breathing. CV: Normal S1/S2, no murmurs. Capillary refill <2 seconds.  Skin: Warm and dry, without lesions or rashes. Neuro:   Grossly intact. No focal deficits appreciated. Responsive to questions.   Diagnostic studies: deferred spiro due to cost concerns.     Salvatore Marvel, MD [facility]

## 2016-11-29 NOTE — Patient Instructions (Addendum)
1. Moderate persistent asthma, uncomplicated - We will not make any medication changes since you are doing well. - Daily controller medication(s): Symbicort 160/4.5 two puffs twice daily with spacer - Rescue medications: ProAir 4 puffs every 4-6 hours as needed - Asthma control goals:  * Full participation in all desired activities (may need albuterol before activity) * Albuterol use two time or less a week on average (not counting use with activity) * Cough interfering with sleep two time or less a month * Oral steroids no more than once a year * No hospitalizations  2. Sinusitis - likely viral at this time - Restart Mucinex to see if this helps.  - Continue with nasal saline rinses. - Continue with the nasal steroid.  3. Perennial allergic rhinitis - Blood testing showed multiple environmental allergens.  - We will plan to start allergy shots once you call us back to confirm that you are interested. - Call your insurance company to find out any copays. - IT Consent signed today.  4. Adverse food reaction - Continue to avoid peanuts, tree nuts, shrimp, and tomato. - Take notes on any seasonings that cause reactions and we can test in the future. - EpiPen is up to date.  5. Return in about 3 months (around 03/01/2017).  Please inform us of any Emergency Department visits, hospitalizations, or changes in symptoms. Call us before going to the ED for breathing or allergy symptoms since we might be able to fit you in for a sick visit. Feel free to contact us anytime with any questions, problems, or concerns.  It was a pleasure to see you again today! Happy spring!   Websites that have reliable patient information: 1. American Academy of Asthma, Allergy, and Immunology: www.aaaai.org 2. Food Allergy Research and Education (FARE): foodallergy.org 3. Mothers of Asthmatics: http://www.asthmacommunitynetwork.org 4. American College of Allergy, Asthma, and Immunology: MonthlyElectricBill.co.uk  You  can buy saline nose drops at a pharmacy, or you can make your own saline solution:  1. Add 1 cup (240 mL) distilled water to a clean container. If you use tap water, boil it first to sterilize it, and then let it cool until it is lukewarm. 2. Add 0.5 tsp (2.5 g) salt to the water. 3. Add 0.5 tsp (2.5 g)baking soda.

## 2016-12-12 ENCOUNTER — Other Ambulatory Visit: Payer: Self-pay | Admitting: Obstetrics and Gynecology

## 2016-12-20 DIAGNOSIS — N643 Galactorrhea not associated with childbirth: Secondary | ICD-10-CM | POA: Diagnosis not present

## 2016-12-20 DIAGNOSIS — E049 Nontoxic goiter, unspecified: Secondary | ICD-10-CM | POA: Diagnosis not present

## 2016-12-20 DIAGNOSIS — R5383 Other fatigue: Secondary | ICD-10-CM | POA: Diagnosis not present

## 2017-01-08 DIAGNOSIS — H1031 Unspecified acute conjunctivitis, right eye: Secondary | ICD-10-CM | POA: Diagnosis not present

## 2017-01-10 DIAGNOSIS — R300 Dysuria: Secondary | ICD-10-CM | POA: Diagnosis not present

## 2017-01-11 NOTE — H&P (Signed)
Jaclyn Haynes is a 46 y.o.  female G:0 presents for hysterectomy because of menorrhagia and symptomatic uterine fibroid.  Patient gives history of a menstrual flow that may last up to 10 days with pad change every 1.5 hours with clots.  She has on numerous occasions soiled her clothes with sudden increases in her bleeding.  Cramping that accompanies these periods are rated 9/10 on a 10 point pain scale and will decrease to 6/10 with NSAIDS.  She's been on oral contraceptives that allow for a period 4 times a year that after several years ceased to be effective.  She goes on to report unscheduled bleeding with the oral contraceptives, urinary urgency but no changes in bowel function, lower back pain or dysuria. An endometrial biopsy in February 2018 showed benign endometrium.  A sono-hysterogram at that same time revealed: anteverted uterus: 6.74 x 5.22 x 6.66 cm with endometrium containing a homogenous vascular mass (polyp vs sub-mucosal fibroid) measures 4.5 x 4.2 cm; right ovary: 2.58 cm and left ovary: 2.70 cm; saline infusion confirmed  a 4.5 endometrial mass.  A review of both medical and surgical management options were given to the patient to include hormonal therapy, Mirena IUD, endometrial ablation, myomectomy, hysteroscopic resection of endometrial mass and hysterectomy.   After carefulc consideration the patient decided to proceed with definitive therapy in the form of hysterectomy.   Past Medical History  OB History: G:0  GYN History: menarche:  46 YO    LMP: 01/01/17    Contracepton abstinence  The patient reports a past history of: chlamydia and trichomonas.  Denies history of abnormal PAP smear.   Last PAP smear: 2017 normal  Medical History: Ventral Hernia, Migraine, DJD, Paget's Disease of the Pelvis,  Asthma, IBS and GERD.  Surgical History: 1995  Right Breast Lumpectomy-benign and 2005 Cholecystectomy Denies problems with anesthesia except PONV.  Denies  history of blood  transfusions  Family History: Stroke, Heart Disease, Hypertension, Diabetes Mellitus, Thyroid Disease, Hyperlipidemia and Psoriatic Arthritis  Social History: Single and employed as a Education officer, [employer];  Denies tobacco use and occasionally consumes alcohol   Medications:  Ashlyna 0.15/30mcg daily Epi Pen  prn Claritin-D 10-240  daily prn Flunisolide 25 mcg nasal spray as directed Meclizine 25 mg every 6 hours prn Meloxicam 7.5 mg 1-2  prn-headache Methocarbanol 750 mg  tid  prn Montelukast 10 mg daily Metoprolol ER 10 mg daily Naritriptan  2.5 mg with onset of headache prn Omeprazole 40 mg daily Ranitidine 150 mg  bid as directed Symbicort  as directed Ventolin 90 mcg HFA Inhaler 2 puffs every 4 hours prn Tramadol 50 mg 1-2 every 6 hours prn Valacyclovir 500 mg  1 do daily   Allergies  Allergen Reactions  . Bee Venom Hives and Swelling    Swelling , more than localized  . Peanut-Containing Drug Products Hives  . Tomato Hives    Only raw tomato causes reaction  Shellfish causes hives Adhesives cause hives Patient reports that she is intolerant of  Vicodin & Percocet  Denies sensitivity soy or  latex.   ROS: Admits to glasses, occasional migraine headaches, vertigo,  chronic nasal congestion, diarrhea related to IBS, bilateral ankle swelling and occasional rash related to sun exposure but  denies  vision changes,  dysphagia, tinnitus,  hoarseness, cough,  chest pain, shortness of breath, nausea, vomiting, constipation,  urinary frequency,  dysuria, hematuria, vaginitis symptoms,  easy bruising,  myalgias, arthralgias,  unexplained weight loss and except as is mentioned in  the history of present illness, patient's review of systems is otherwise negative.     Physical Exam  Bp: 110/76    P: 88 bpm     R: 16  Temperature: 98.8 degrees F orally  Weight: 256 lbs.  Height: 5' 4.5" BMI: 43.3  Neck: supple without masses or thyromegaly Lungs: clear to auscultation Heart: regular  rate and rhythm Abdomen: soft, non-tender and no organomegaly Pelvic:EGBUS- wnl; vagina-normal rugae; uterus- appears normal size though exam limited by habitus, cervix without lesions or motion tenderness; adnexae-no tenderness or masses Extremities:  no clubbing, cyanosis or edema   Assesment: Menorrhagia           Symptomatic Uterine Fibroid  Disposition:  A discussion was held with patient regarding the indication for her procedure(s) along with the risks, which include but are not limited to: reaction to anesthesia, damage to adjacent organs, infection, excessive bleeding and possible need for an open abdominal incision. The patient verbalized understanding of these risks and has consented to proceed with a Laparoscopically Assisted Vaginal Hysterectomy, Bilateral Salpingectomy, Possible Cystoscopy and Possible Total Abdominal Hysterectomy at [place] on Feb 07, 2016  . CSN# [id number]   Byrd Terrero J. Florene Glen, PA-C  for Dr. Franklyn Lor. Dillard

## 2017-01-12 DIAGNOSIS — H1031 Unspecified acute conjunctivitis, right eye: Secondary | ICD-10-CM | POA: Diagnosis not present

## 2017-01-18 ENCOUNTER — Other Ambulatory Visit: Payer: Self-pay | Admitting: Obstetrics and Gynecology

## 2017-01-23 ENCOUNTER — Inpatient Hospital Stay (HOSPITAL_COMMUNITY)
Admission: RE | Admit: 2017-01-23 | Discharge: 2017-01-23 | Disposition: A | Discharge status: Home / Self Care | Payer: 59 | Source: Ambulatory Visit

## 2017-01-26 ENCOUNTER — Other Ambulatory Visit: Payer: Self-pay | Admitting: Obstetrics and Gynecology

## 2017-01-31 NOTE — Patient Instructions (Signed)
Your procedure is scheduled on:  Tuesday, Feb 13, 2017  Enter through the [employer] [facility] at:  11:00 AM  Pick up the phone at the desk and dial [phone].  Call this number if you have problems the morning of surgery: [phone].  Remember: Do NOT eat food:  After Midnight Monday  Do NOT drink clear liquids after:  8:30 AM day of surgery  Take these medicines the morning of surgery with a SIP OF WATER:  Metoprolol, Ranitidine, Use Asthma Inhaler per normal routine  Bring Asthma Inhaler day of surgery  Stop ALL herbal medications at this time  Do NOT smoke the day of surgery.  Do NOT wear jewelry (body piercing), metal hair clips/bobby pins, make-up, or nail polish. Do NOT wear lotions, powders, or perfumes.  You may wear deodorant. Do NOT shave for 48 hours prior to surgery. Do NOT bring valuables to the hospital. Contacts, dentures, or bridgework may not be worn into surgery.  Have a responsible adult drive you home and stay with you for 24 hours after your procedure  Bring a copy of your healthcare power of attorney and living will documents.

## 2017-02-01 ENCOUNTER — Encounter (HOSPITAL_COMMUNITY)
Admission: RE | Admit: 2017-02-01 | Discharge: 2017-02-01 | Disposition: A | Discharge status: Home / Self Care | Payer: 59 | Source: Ambulatory Visit | Attending: Obstetrics and Gynecology | Admitting: Obstetrics and Gynecology

## 2017-02-01 ENCOUNTER — Encounter (HOSPITAL_COMMUNITY): Payer: Self-pay

## 2017-02-01 DIAGNOSIS — N920 Excessive and frequent menstruation with regular cycle: Secondary | ICD-10-CM | POA: Diagnosis not present

## 2017-02-01 DIAGNOSIS — D259 Leiomyoma of uterus, unspecified: Secondary | ICD-10-CM | POA: Diagnosis not present

## 2017-02-01 DIAGNOSIS — Z01812 Encounter for preprocedural laboratory examination: Secondary | ICD-10-CM | POA: Diagnosis not present

## 2017-02-01 HISTORY — DX: Personal history of other diseases of the digestive system: Z87.19

## 2017-02-01 HISTORY — DX: Headache: R51

## 2017-02-01 HISTORY — DX: Other specified postprocedural states: Z98.890

## 2017-02-01 HISTORY — DX: Cardiac arrhythmia, unspecified: I49.9

## 2017-02-01 HISTORY — DX: Headache, unspecified: R51.9

## 2017-02-01 HISTORY — DX: Nausea with vomiting, unspecified: R11.2

## 2017-02-01 HISTORY — DX: Gastro-esophageal reflux disease without esophagitis: K21.9

## 2017-02-01 HISTORY — DX: Deficiency of other specified B group vitamins: E53.8

## 2017-02-01 LAB — BASIC METABOLIC PANEL
Anion gap: 8 (ref 5–15)
BUN: 10 mg/dL (ref 6–20)
CO2: 26 mmol/L (ref 22–32)
Calcium: 9 mg/dL (ref 8.9–10.3)
Chloride: 105 mmol/L (ref 101–111)
Creatinine, Ser: 0.79 mg/dL (ref 0.44–1.00)
Glucose, Bld: 127 mg/dL — ABNORMAL HIGH (ref 65–99)
POTASSIUM: 4.1 mmol/L (ref 3.5–5.1)
SODIUM: 139 mmol/L (ref 135–145)

## 2017-02-01 LAB — CBC
HCT: 38.6 % (ref 36.0–46.0)
Hemoglobin: 12.5 g/dL (ref 12.0–15.0)
MCH: 28.3 pg (ref 26.0–34.0)
MCHC: 32.4 g/dL (ref 30.0–36.0)
MCV: 87.5 fL (ref 78.0–100.0)
PLATELETS: 261 10*3/uL (ref 150–400)
RBC: 4.41 MIL/uL (ref 3.87–5.11)
RDW: 13.8 % (ref 11.5–15.5)
WBC: 7.8 10*3/uL (ref 4.0–10.5)

## 2017-02-06 DIAGNOSIS — G44219 Episodic tension-type headache, not intractable: Secondary | ICD-10-CM | POA: Diagnosis not present

## 2017-02-06 DIAGNOSIS — G43009 Migraine without aura, not intractable, without status migrainosus: Secondary | ICD-10-CM | POA: Diagnosis not present

## 2017-02-13 ENCOUNTER — Ambulatory Visit (HOSPITAL_COMMUNITY): Payer: 59 | Admitting: Anesthesiology

## 2017-02-13 ENCOUNTER — Encounter (HOSPITAL_COMMUNITY)
Admission: RE | Disposition: A | Discharge status: Home / Self Care | Payer: Self-pay | Source: Ambulatory Visit | Attending: Obstetrics and Gynecology

## 2017-02-13 ENCOUNTER — Encounter (HOSPITAL_COMMUNITY): Payer: Self-pay | Admitting: *Deleted

## 2017-02-13 ENCOUNTER — Ambulatory Visit (HOSPITAL_COMMUNITY)
Admission: RE | Admit: 2017-02-13 | Discharge: 2017-02-13 | Disposition: A | Discharge status: Home / Self Care | Payer: 59 | Source: Ambulatory Visit | Attending: Obstetrics and Gynecology | Admitting: Obstetrics and Gynecology

## 2017-02-13 DIAGNOSIS — D259 Leiomyoma of uterus, unspecified: Secondary | ICD-10-CM | POA: Insufficient documentation

## 2017-02-13 DIAGNOSIS — J45909 Unspecified asthma, uncomplicated: Secondary | ICD-10-CM | POA: Diagnosis not present

## 2017-02-13 DIAGNOSIS — K219 Gastro-esophageal reflux disease without esophagitis: Secondary | ICD-10-CM | POA: Diagnosis not present

## 2017-02-13 DIAGNOSIS — N946 Dysmenorrhea, unspecified: Secondary | ICD-10-CM | POA: Diagnosis not present

## 2017-02-13 DIAGNOSIS — K589 Irritable bowel syndrome without diarrhea: Secondary | ICD-10-CM | POA: Insufficient documentation

## 2017-02-13 DIAGNOSIS — N920 Excessive and frequent menstruation with regular cycle: Secondary | ICD-10-CM | POA: Insufficient documentation

## 2017-02-13 HISTORY — PX: DILATATION & CURETTAGE/HYSTEROSCOPY WITH MYOSURE: SHX6511

## 2017-02-13 HISTORY — PX: HYSTEROSCOPY: SHX211

## 2017-02-13 LAB — PREGNANCY, URINE: PREG TEST UR: NEGATIVE

## 2017-02-13 SURGERY — DILATATION & CURETTAGE/HYSTEROSCOPY WITH MYOSURE
Anesthesia: General | Site: Vagina

## 2017-02-13 MED ORDER — FENTANYL CITRATE (PF) 100 MCG/2ML IJ SOLN
25.0000 ug | INTRAMUSCULAR | Status: DC | PRN
  Administered 2017-02-13 (×2): 50 ug via INTRAVENOUS

## 2017-02-13 MED ORDER — CEFAZOLIN SODIUM-DEXTROSE 2-4 GM/100ML-% IV SOLN
2.0000 g | INTRAVENOUS | Status: AC
  Administered 2017-02-13: 2 g via INTRAVENOUS

## 2017-02-13 MED ORDER — SCOPOLAMINE 1 MG/3DAYS TD PT72
1.0000 | MEDICATED_PATCH | Freq: Once | TRANSDERMAL | Status: DC
  Administered 2017-02-13: 1.5 mg via TRANSDERMAL

## 2017-02-13 MED ORDER — LIDOCAINE HCL 2 % IJ SOLN
INTRAMUSCULAR | Status: AC
  Filled 2017-02-13: qty 20

## 2017-02-13 MED ORDER — ONDANSETRON HCL 4 MG/2ML IJ SOLN
4.0000 mg | Freq: Once | INTRAMUSCULAR | Status: AC
  Administered 2017-02-13: 4 mg via INTRAVENOUS

## 2017-02-13 MED ORDER — KETOROLAC TROMETHAMINE 30 MG/ML IJ SOLN
INTRAMUSCULAR | Status: DC | PRN
  Administered 2017-02-13: 30 mg via INTRAVENOUS

## 2017-02-13 MED ORDER — MIDAZOLAM HCL 2 MG/2ML IJ SOLN
INTRAMUSCULAR | Status: DC | PRN
  Administered 2017-02-13: 1 mg via INTRAVENOUS

## 2017-02-13 MED ORDER — DEXAMETHASONE SODIUM PHOSPHATE 10 MG/ML IJ SOLN
INTRAMUSCULAR | Status: DC | PRN
  Administered 2017-02-13: 4 mg via INTRAVENOUS

## 2017-02-13 MED ORDER — LIDOCAINE HCL 2 % IJ SOLN
INTRAMUSCULAR | Status: DC | PRN
  Administered 2017-02-13: 20 mL

## 2017-02-13 MED ORDER — KETOROLAC TROMETHAMINE 30 MG/ML IJ SOLN
INTRAMUSCULAR | Status: AC
  Filled 2017-02-13: qty 1

## 2017-02-13 MED ORDER — SODIUM CHLORIDE 0.9 % IR SOLN
Status: DC | PRN
  Administered 2017-02-13: 6000 mL

## 2017-02-13 MED ORDER — MIDAZOLAM HCL 2 MG/2ML IJ SOLN
INTRAMUSCULAR | Status: AC
  Filled 2017-02-13: qty 2

## 2017-02-13 MED ORDER — ONDANSETRON HCL 4 MG/2ML IJ SOLN
INTRAMUSCULAR | Status: AC
  Administered 2017-02-13: 4 mg via INTRAVENOUS
  Filled 2017-02-13: qty 2

## 2017-02-13 MED ORDER — LACTATED RINGERS IV SOLN
INTRAVENOUS | Status: DC
  Administered 2017-02-13 (×2): via INTRAVENOUS

## 2017-02-13 MED ORDER — DEXAMETHASONE SODIUM PHOSPHATE 4 MG/ML IJ SOLN
INTRAMUSCULAR | Status: AC
  Filled 2017-02-13: qty 1

## 2017-02-13 MED ORDER — PROPOFOL 10 MG/ML IV BOLUS
INTRAVENOUS | Status: AC
  Filled 2017-02-13: qty 20

## 2017-02-13 MED ORDER — LIDOCAINE HCL (CARDIAC) 20 MG/ML IV SOLN
INTRAVENOUS | Status: DC | PRN
  Administered 2017-02-13: 70 mg via INTRAVENOUS

## 2017-02-13 MED ORDER — ONDANSETRON HCL 4 MG/2ML IJ SOLN
INTRAMUSCULAR | Status: DC | PRN
  Administered 2017-02-13: 4 mg via INTRAVENOUS

## 2017-02-13 MED ORDER — METHYLERGONOVINE MALEATE 0.2 MG PO TABS: 0.2000 mg | ORAL_TABLET | Freq: Four times a day (QID) | ORAL | 0 refills | Status: DC

## 2017-02-13 MED ORDER — DOXYCYCLINE HYCLATE 50 MG PO CAPS: ORAL_CAPSULE | ORAL | 0 refills | Status: DC

## 2017-02-13 MED ORDER — LIDOCAINE HCL (CARDIAC) 20 MG/ML IV SOLN
INTRAVENOUS | Status: AC
  Filled 2017-02-13: qty 5

## 2017-02-13 MED ORDER — SCOPOLAMINE 1 MG/3DAYS TD PT72
MEDICATED_PATCH | TRANSDERMAL | Status: DC
Start: 2017-02-13 — End: 2017-02-13
  Administered 2017-02-13: 1.5 mg via TRANSDERMAL
  Filled 2017-02-13: qty 1

## 2017-02-13 MED ORDER — FENTANYL CITRATE (PF) 100 MCG/2ML IJ SOLN
INTRAMUSCULAR | Status: AC
  Filled 2017-02-13: qty 2

## 2017-02-13 MED ORDER — FENTANYL CITRATE (PF) 100 MCG/2ML IJ SOLN
INTRAMUSCULAR | Status: DC | PRN
  Administered 2017-02-13 (×2): 50 ug via INTRAVENOUS

## 2017-02-13 MED ORDER — ALBUTEROL SULFATE HFA 108 (90 BASE) MCG/ACT IN AERS
INHALATION_SPRAY | RESPIRATORY_TRACT | Status: DC | PRN
  Administered 2017-02-13: 2 via RESPIRATORY_TRACT

## 2017-02-13 MED ORDER — ONDANSETRON HCL 4 MG/2ML IJ SOLN
INTRAMUSCULAR | Status: AC
  Filled 2017-02-13: qty 2

## 2017-02-13 MED ORDER — HYDROCODONE-ACETAMINOPHEN 5-325 MG PO TABS: 1.0000 | ORAL_TABLET | Freq: Four times a day (QID) | ORAL | 0 refills | Status: DC | PRN

## 2017-02-13 MED ORDER — PROPOFOL 10 MG/ML IV BOLUS
INTRAVENOUS | Status: DC | PRN
  Administered 2017-02-13: 180 mg via INTRAVENOUS

## 2017-02-13 MED ORDER — IBUPROFEN 200 MG PO TABS: 600.0000 mg | ORAL_TABLET | Freq: Four times a day (QID) | ORAL | 0 refills | Status: DC | PRN

## 2017-02-13 SURGICAL SUPPLY — 28 items
CANISTER SUCT 3000ML PPV (MISCELLANEOUS) ×2 IMPLANT
CATH ROBINSON RED A/P 16FR (CATHETERS) ×2 IMPLANT
CLOTH BEACON ORANGE TIMEOUT ST (SAFETY) ×2 IMPLANT
CONTAINER PREFILL 10% NBF 60ML (FORM) ×4 IMPLANT
DEVICE MYOSURE LITE (MISCELLANEOUS) IMPLANT
DEVICE MYOSURE REACH (MISCELLANEOUS) IMPLANT
DILATOR CANAL MILEX (MISCELLANEOUS) IMPLANT
ELECT REM PT RETURN 9FT ADLT (ELECTROSURGICAL) ×2
ELECTRODE REM PT RTRN 9FT ADLT (ELECTROSURGICAL) ×1 IMPLANT
FILTER ARTHROSCOPY CONVERTOR (FILTER) ×2 IMPLANT
GAUZE SPONGE 4X4 16PLY XRAY LF (GAUZE/BANDAGES/DRESSINGS) ×1 IMPLANT
GLOVE BIO SURGEON STRL SZ 6.5 (GLOVE) ×4 IMPLANT
GLOVE BIOGEL PI IND STRL 7.0 (GLOVE) ×3 IMPLANT
GLOVE BIOGEL PI INDICATOR 7.0 (GLOVE) ×3
GOWN STRL REUS W/TWL LRG LVL3 (GOWN DISPOSABLE) ×4 IMPLANT
MYOSURE XL FIBROID REM (MISCELLANEOUS) ×2
PACK VAGINAL MINOR WOMEN LF (CUSTOM PROCEDURE TRAY) ×2 IMPLANT
PAD OB MATERNITY 4.3X12.25 (PERSONAL CARE ITEMS) ×4 IMPLANT
SEAL ROD LENS SCOPE MYOSURE (ABLATOR) ×2 IMPLANT
SET GENESYS HTA PROCERVA (MISCELLANEOUS) ×2 IMPLANT
SUT VIC AB 0 CT1 27 (SUTURE) ×2
SUT VIC AB 0 CT1 27XBRD ANTBC (SUTURE) IMPLANT
SYSTEM TISS REMOVAL MYSR XL RM (MISCELLANEOUS) IMPLANT
TOWEL OR 17X24 6PK STRL BLUE (TOWEL DISPOSABLE) ×4 IMPLANT
TUBING AQUILEX INFLOW (TUBING) ×3 IMPLANT
TUBING AQUILEX OUTFLOW (TUBING) ×3 IMPLANT
TUBING CONNECTING 10 (TUBING) ×1 IMPLANT
YANKAUER SUCT BULB TIP NO VENT (SUCTIONS) ×1 IMPLANT

## 2017-02-13 NOTE — Anesthesia Preprocedure Evaluation (Signed)
Anesthesia Evaluation  Patient identified by MRN, date of birth, ID band Patient awake    Reviewed: Allergy & Precautions, H&P , Patient's Chart, lab work & pertinent test results, reviewed documented beta blocker date and time   Airway Mallampati: II  TM Distance: >3 FB Neck ROM: full    Dental no notable dental hx.    Pulmonary asthma ,    Pulmonary exam normal breath sounds clear to auscultation       Cardiovascular  Rhythm:regular Rate:Normal     Neuro/Psych    GI/Hepatic   Endo/Other    Renal/GU      Musculoskeletal   Abdominal   Peds  Hematology   Anesthesia Other Findings   Reproductive/Obstetrics                             Anesthesia Physical Anesthesia Plan  ASA: II  Anesthesia Plan: General   Post-op Pain Management:    Induction: Intravenous  Airway Management Planned: LMA  Additional Equipment:   Intra-op Plan:   Post-operative Plan:   Informed Consent: I have reviewed the patients History and Physical, chart, labs and discussed the procedure including the risks, benefits and alternatives for the proposed anesthesia with the patient or authorized representative who has indicated his/her understanding and acceptance.   Dental Advisory Given  Plan Discussed with: CRNA and Surgeon  Anesthesia Plan Comments: ( )        Anesthesia Quick Evaluation

## 2017-02-13 NOTE — Op Note (Signed)
Pre op DX: Dysmenorrhea, Pelvic Pain, Menorrhagia, Fibroids   Post Op MG:NOIB   PHYSICIAN : Yuleni Burich   ASSISTANTS: none   ANESTHESIA:   general and paracervical block  ESTIMATED BLOOD LOSS: minimal  LOCAL MEDICATIONS USED:  2% XYLOCAINE 20CC  SPECIMEN:  Source of Specimen:  FRAGMENTS OF FIBROID  DISPOSITION OF SPECIMEN:  PATHOLOGY  COUNTS Correct:  YES    DICTATION #: The patient was taken to the operating room and prepped and draped in a normal sterile fashion. An in out catheter was used to drain the bladder.   A bivalve speculum was placed into the vagina and anterior lip of the cervix was grasped with a single-tooth tenaculum.  20 cc of 2% xyocaine was used for cervical block.  the cervix was then dilated with Kennon Rounds dilators up to 21. The hysteroscope was placed into the uterine cavity. The  entire uterus and both ostia were visualized. Large 4 cm fibroid was seen.  2/3 was removed with the myosure.  I stopped the procedure because of a 2300 deficit of saline.   Hyseroscope was then removed from the uteru. The  Myometrial fragments  were sent to pathology. Again the hysteroscope was placed into the uterine cavity. I tried to do an HTA but a leak prevented [place] from doing the procedure.  I tried single and four prong tenaculum.  I tried and allis clamp.  I also placed a purse string suture around the cervix.  It still was a small leak.  I was not able to do the ablation.  I did another diagnostic look to see if a perforation was present.  I did not see any evidence of perforation.   The tenaculum was removed from the cervix and hemostasis was noted with silver nitrae PLAN OF CARE: discharge to home  PATIENT DISPOSITION:  PACU - hemodynamically stable.

## 2017-02-13 NOTE — Transfer of Care (Signed)
Immediate Anesthesia Transfer of Care Note  Patient: Jaclyn Haynes  Procedure(s) Performed: Procedure(s) with comments: DILATATION/HYSTEROSCOPY WITH MYOSURE (N/A) - Clint the rep will be here.  Confirmed on 02/08/17 with chass ATTEMPTED HYDROTHERMAL ABLATION (N/A) - HTA rep will be here   Patient Location: PACU  Anesthesia Type:General  Level of Consciousness: awake, sedated, drowsy and patient cooperative  Airway & Oxygen Therapy: Patient Spontanous Breathing and Patient connected to nasal cannula oxygen  Post-op Assessment: Report given to RN and Post -op Vital signs reviewed and stable  Post vital signs: Reviewed and stable  Last Vitals:  Vitals:   02/13/17 1104  BP: (!) 135/91  Pulse: 89  Resp: 16  Temp: 37 C    Last Pain:  Vitals:   02/13/17 1104  TempSrc: Oral      Patients Stated Pain Goal: 3 (97/98/92 1194)  Complications: No apparent anesthesia complications

## 2017-02-13 NOTE — Anesthesia Procedure Notes (Signed)
Procedure Name: LMA Insertion Date/Time: 02/13/2017 12:44 PM Performed by: Tobin Chad Pre-anesthesia Checklist: Patient identified, Patient being monitored, Emergency Drugs available and Suction available Patient Re-evaluated:Patient Re-evaluated prior to inductionOxygen Delivery Method: Circle system utilized and Simple face mask Preoxygenation: Pre-oxygenation with 100% oxygen Intubation Type: IV induction Ventilation: Mask ventilation without difficulty LMA: LMA inserted LMA Size: 4.0 Number of attempts: 1 Placement Confirmation: positive ETCO2 and breath sounds checked- equal and bilateral Dental Injury: Teeth and Oropharynx as per pre-operative assessment

## 2017-02-13 NOTE — Discharge Instructions (Signed)
Hysteroscopy  Hysteroscopy is a procedure used for looking inside the womb (uterus). It may be done for various reasons, including:  · To evaluate abnormal bleeding, fibroid (benign, noncancerous) tumors, polyps, scar tissue (adhesions), and possibly cancer of the uterus.  · To look for lumps (tumors) and other uterine growths.  · To look for causes of why a woman cannot get pregnant (infertility), causes of recurrent loss of pregnancy (miscarriages), or a lost intrauterine device (IUD).  · To perform a sterilization by blocking the fallopian tubes from inside the uterus.    In this procedure, a thin, flexible tube with a tiny light and camera on the end of it (hysteroscope) is used to look inside the uterus. A hysteroscopy should be done right after a menstrual period to be sure you are not pregnant.  LET YOUR HEALTH CARE PROVIDER KNOW ABOUT:  · Any allergies you have.  · All medicines you are taking, including vitamins, herbs, eye drops, creams, and over-the-counter medicines.  · Previous problems you or members of your family have had with the use of anesthetics.  · Any blood disorders you have.  · Previous surgeries you have had.  · Medical conditions you have.  RISKS AND COMPLICATIONS  Generally, this is a safe procedure. However, as with any procedure, complications can occur. Possible complications include:  · Putting a hole in the uterus.  · Excessive bleeding.  · Infection.  · Damage to the cervix.  · Injury to other organs.  · Allergic reaction to medicines.  · Too much fluid used in the uterus for the procedure.    BEFORE THE PROCEDURE  · Ask your health care provider about changing or stopping any regular medicines.  · Do not take aspirin or blood thinners for 1 week before the procedure, or as directed by your health care provider. These can cause bleeding.  · If you smoke, do not smoke for 2 weeks before the procedure.  · In some cases, a medicine is placed in the cervix the day before the procedure.  This medicine makes the cervix have a larger opening (dilate). This makes it easier for the instrument to be inserted into the uterus during the procedure.  · Do not eat or drink anything for at least 8 hours before the surgery.  · Arrange for someone to take you home after the procedure.  PROCEDURE  · You may be given a medicine to relax you (sedative). You may also be given one of the following:  ? A medicine that numbs the area around the cervix (local anesthetic).  ? A medicine that makes you sleep through the procedure (general anesthetic).  · The hysteroscope is inserted through the vagina into the uterus. The camera on the hysteroscope sends a picture to a TV screen. This gives the surgeon a good view inside the uterus.  · During the procedure, air or a liquid is put into the uterus, which allows the surgeon to see better.  · Sometimes, tissue is gently scraped from inside the uterus. These tissue samples are sent to a lab for testing.  What to expect after the procedure  · If you had a general anesthetic, you may be groggy for a couple hours after the procedure.  · If you had a local anesthetic, you will be able to go home as soon as you are stable and feel ready.  · You may have some cramping. This normally lasts for a couple days.  · You may   have bleeding, which varies from light spotting for a few days to menstrual-like bleeding for 3-7 days. This is normal.  · If your test results are not back during the visit, make an appointment with your health care provider to find out the results.  This information is not intended to replace advice given to you by your health care provider. Make sure you discuss any questions you have with your health care provider.  Document Released: 12/18/2000 Document Revised: 02/17/2016 Document Reviewed: 04/10/2013  Elsevier Interactive Patient Education © 2017 [employer]

## 2017-02-14 NOTE — Anesthesia Postprocedure Evaluation (Signed)
Anesthesia Post Note  Patient: Jaclyn Haynes  Procedure(s) Performed: Procedure(s) (LRB): DILATATION/HYSTEROSCOPY WITH MYOSURE (N/A) ATTEMPTED HYDROTHERMAL ABLATION (N/A)  Patient location during evaluation: PACU Anesthesia Type: General Level of consciousness: awake and alert Pain management: pain level controlled Vital Signs Assessment: post-procedure vital signs reviewed and stable Respiratory status: spontaneous breathing, nonlabored ventilation, respiratory function stable and patient connected to nasal cannula oxygen Cardiovascular status: blood pressure returned to baseline and stable Postop Assessment: no signs of nausea or vomiting Anesthetic complications: no       Last Vitals:  Vitals:   02/13/17 1530 02/13/17 1715  BP: 140/89 140/78  Pulse: 85 85  Resp: 20 18  Temp: 37 C 36.7 C    Last Pain:  Vitals:   02/13/17 1715  TempSrc:   PainSc: [place]

## 2017-02-15 ENCOUNTER — Encounter (HOSPITAL_COMMUNITY): Payer: Self-pay | Admitting: Obstetrics and Gynecology

## 2017-02-27 DIAGNOSIS — R300 Dysuria: Secondary | ICD-10-CM | POA: Diagnosis not present

## 2017-02-27 DIAGNOSIS — Z09 Encounter for follow-up examination after completed treatment for conditions other than malignant neoplasm: Secondary | ICD-10-CM | POA: Diagnosis not present

## 2017-03-01 ENCOUNTER — Ambulatory Visit (INDEPENDENT_AMBULATORY_CARE_PROVIDER_SITE_OTHER): Payer: 59 | Admitting: Allergy & Immunology

## 2017-03-01 ENCOUNTER — Encounter: Payer: Self-pay | Admitting: Allergy & Immunology

## 2017-03-01 VITALS — BP 130/76 | HR 84 | Temp 98.7°F | Resp 20 | Ht 64.5 in | Wt 249.8 lb

## 2017-03-01 DIAGNOSIS — J3089 Other allergic rhinitis: Secondary | ICD-10-CM

## 2017-03-01 DIAGNOSIS — J4540 Moderate persistent asthma, uncomplicated: Secondary | ICD-10-CM

## 2017-03-01 DIAGNOSIS — T781XXD Other adverse food reactions, not elsewhere classified, subsequent encounter: Secondary | ICD-10-CM | POA: Diagnosis not present

## 2017-03-01 NOTE — Progress Notes (Signed)
FOLLOW UP  Date of Service/Encounter:  03/01/17   Assessment:   Moderate persistent asthma, uncomplicated  Adverse food reaction  Perennial allergic rhinitis    Asthma Reportables:  Severity: moderate persistent  Risk: low Control: well controlled   Plan/Recommendations:   1. Moderate persistent asthma, uncomplicated - We will not make any medication changes since you are doing well. - Daily controller medication(s): Symbicort 160/4.5 two puffs twice daily with spacer - Rescue medications: ProAir 4 puffs every 4-6 hours as needed - Asthma control goals:  * Full participation in all desired activities (may need albuterol before activity) * Albuterol use two time or less a week on average (not counting use with activity) * Cough interfering with sleep two time or less a month * Oral steroids no more than once a year * No hospitalizations  2. Perennial allergic rhinitis (grasses, weeds, trees, mold, roach, and dust mites) - Continue with Nasacort one spray per nostril daily. - Continue with Xyzal 5mg  daily.  - Continue with Mucinex twice daily as needed.  - Call your insurance about allergy shots.   4. Adverse food reaction (peanuts, tree nuts, shrimp, tomato) - Continue to avoid peanuts, tree nuts, shrimp, and tomato. - Take notes on any seasonings that cause reactions and we can test in the future. - EpiPen is up to date.  5. Return in about 3 months (around 06/01/2017).   Subjective:   Louetta R Borys is a 46 y.o. female presenting today for follow up of  Chief Complaint  Patient presents with  . Asthma  . Allergies    Laurabelle R Bellmore has a history of the following: Patient Active Problem List   Diagnosis Date Noted  . Angioedema of lips 05/21/2016  . Allergic reaction 05/21/2016  . Palpitation 08/04/2015  . Migraine without aura and without status migrainosus, not intractable 10/27/2014  . Dizziness and giddiness 10/27/2013  . Disturbance of  skin sensation 10/27/2013    History obtained from: chart review and patient.  Conor R Mateus was referred by Lennie Odor, PA-C.     Anthonette is a 46 y.o. female presenting for a follow up visit. She was last seen in March 2018. At that time, we continued her on Symbicort 160/4.5 g 2 puffs in the morning and 2 puffs at night. She was complaining of sinusitis, which I felt was viral in nature. I recommended restarting Mucinex and continuing with nasal saline rinses and Nasacort. She does have sensitizations to dust mite, cat, dog, trees, weeds, grasses, cockroach, and molds. This was all done via blood testing. She has a history of food allergies to peanuts, tree nuts, shrimp, and tomatoes. I recommended continued avoidance.   Since the last visit, "everything has done downhill". She has had two urgent care visits from problems with her eyes. She saw an ophthalmologist and was diagnosed with episcleritis. She was put on a steroid and antibiotic eye drop as needed. She is using a moisturizer. Her eye problems started in March She also had surgery for removal of a uterine fibroid which thankfully was not malignant. She is going to come back to work on Monday. Surgery went well otherwise She has been off of work for three works.   Asthma/Respiratory Symptom History: She has done well with regards to her breathing. Nika's asthma has been well controlled. She has not required rescue medication, experienced nocturnal awakenings due to lower respiratory symptoms, nor have activities of daily living been limited. She has required no Emergency  Department or Urgent Care visits for her asthma. She has required zero courses of system steroids for asthma exacerbations since the last visit. ACT score today is 20, indicating excellent asthma symptom control.   Allergic Rhinitis Symptom History: She was going to start her shots last year but she never got a a straight answer. She is going to follow up again with  her insurance company. She is on the fluticasone, but she likes the Nasacort better. Nasonex worked the best. She is on Xyzal 5mg  daily which seems to be working the best.  Food Allergy Symptom History: She continues to avoid peanuts, tree nuts, shrimp, and tomatoes. EpiPen is up to date. She has had no accidental reactions since the last visit.   Otherwise, there have been no changes to her past medical history, surgical history, family history, or social history. She was on antibiotics for her recent surgery as well antibiotic eye drops. She uses Mucinex as needed for the sinus problems.     Review of Systems: a 14-point review of systems is pertinent for what is mentioned in HPI.  Otherwise, all other systems were negative. Constitutional: negative other than that listed in the HPI Eyes: negative other than that listed in the HPI Ears, nose, mouth, throat, and face: negative other than that listed in the HPI Respiratory: negative other than that listed in the HPI Cardiovascular: negative other than that listed in the HPI Gastrointestinal: negative other than that listed in the HPI Genitourinary: negative other than that listed in the HPI Integument: negative other than that listed in the HPI Hematologic: negative other than that listed in the HPI Musculoskeletal: negative other than that listed in the HPI Neurological: negative other than that listed in the HPI Allergy/Immunologic: negative other than that listed in the HPI    Objective:   Blood pressure 130/76, pulse 84, temperature 98.7 F (37.1 C), temperature source Oral, resp. rate 20, height 5' 4.5" (1.638 m), weight 249 lb 12.8 oz (113.3 kg). Body mass index is 42.22 kg/m.   Physical Exam:  General: Alert, interactive, in no acute distress. Very appreciative and pleasant female. Eyes: No conjunctival injection present on the right, No conjunctival injection present on the left, PERRL bilaterally, No discharge on the  right, No discharge on the left and No Horner-Trantas dots present Ears: Right TM pearly gray with normal light reflex, Left TM pearly gray with normal light reflex, Right TM intact without perforation and Left TM intact without perforation.  Nose/Throat: External nose within normal limits and septum midline, turbinates markedly edematous and pale with clear discharge, post-pharynx erythematous with cobblestoning in the posterior oropharynx. Tonsils 2+ without exudates Neck: Supple without thyromegaly. Lungs: Clear to auscultation without wheezing, rhonchi or rales. No increased work of breathing. CV: Normal S1/S2, no murmurs. Capillary refill <2 seconds.  Skin: Warm and dry, without lesions or rashes. Neuro:   Grossly intact. No focal deficits appreciated. Responsive to questions.   Diagnostic studies:   Spirometry: results normal (FEV1: 2.38/95%, FVC: 2.92/94%, FEV1/FVC: 82%).  Spirometry consistent with normal pattern.   Allergy Studies: none     Salvatore Marvel, MD [facility]

## 2017-03-01 NOTE — Patient Instructions (Addendum)
1. Moderate persistent asthma, uncomplicated - We will not make any medication changes since you are doing well. - Daily controller medication(s): Symbicort 160/4.5 two puffs twice daily with spacer - Rescue medications: ProAir 4 puffs every 4-6 hours as needed - Asthma control goals:  * Full participation in all desired activities (may need albuterol before activity) * Albuterol use two time or less a week on average (not counting use with activity) * Cough interfering with sleep two time or less a month * Oral steroids no more than once a year * No hospitalizations  2. Perennial allergic rhinitis (grasses, weeds, trees, mold, roach, and dust mites) - Continue with Nasacort one spray per nostril daily. - Continue with Xyzal 5mg  daily.  - Continue with Mucinex twice daily as needed.  - Call your insurance about allergy shots.   4. Adverse food reaction (peanuts, tree nuts, shrimp, tomato) - Continue to avoid peanuts, tree nuts, shrimp, and tomato. - Take notes on any seasonings that cause reactions and we can test in the future. - EpiPen is up to date.  5. Return in about 3 months (around 06/01/2017).  Please inform us of any Emergency Department visits, hospitalizations, or changes in symptoms. Call us before going to the ED for breathing or allergy symptoms since we might be able to fit you in for a sick visit. Feel free to contact us anytime with any questions, problems, or concerns.  It was a pleasure to see you again today! Happy summer!   Websites that have reliable patient information: 1. American Academy of Asthma, Allergy, and Immunology: www.aaaai.org 2. Food Allergy Research and Education (FARE): foodallergy.org 3. Mothers of Asthmatics: http://www.asthmacommunitynetwork.org 4. American College of Allergy, Asthma, and Immunology: MonthlyElectricBill.co.uk  You can buy saline nose drops at a pharmacy, or you can make your own saline solution:  1. Add 1 cup (240 mL) distilled water to a  clean container. If you use tap water, boil it first to sterilize it, and then let it cool until it is lukewarm. 2. Add 0.5 tsp (2.5 g) salt to the water. 3. Add 0.5 tsp (2.5 g)baking soda.

## 2017-04-04 ENCOUNTER — Ambulatory Visit: Payer: 59 | Admitting: *Deleted

## 2017-04-04 ENCOUNTER — Telehealth: Payer: Self-pay | Admitting: Allergy & Immunology

## 2017-04-04 DIAGNOSIS — J309 Allergic rhinitis, unspecified: Secondary | ICD-10-CM | POA: Diagnosis not present

## 2017-04-04 NOTE — Progress Notes (Signed)
Immunotherapy   Patient Details  Name: Jaclyn Haynes MRN: [id number] Date of Birth: Sep 01, 1971  04/04/2017  Jaclyn Haynes started injections for  Pollen-Cat-Dog/ Dmite-RW-Mold-CR Following schedule: B  Frequency:1 time per week Epi-Pen:Epi-Pen Available  Consent signed and patient instructions given.   Orlene Erm 04/04/2017, 4:42 PM

## 2017-04-04 NOTE — Telephone Encounter (Signed)
Spoke to patient states she has no complains she is just itchy after injections. Advised patient to disregard call states she thought this was a courtesy call but is doing good

## 2017-04-04 NOTE — Telephone Encounter (Signed)
Patient stated immunotherapy today at 3. Please advise

## 2017-04-04 NOTE — Telephone Encounter (Signed)
patient is stating that she is having alot of sinus drainage. That it is building up and causing her equilibrium to be off Right ear And possible causing her headaches Patient wanted Dr Ernst Bowler to be aware

## 2017-04-04 NOTE — Telephone Encounter (Addendum)
Called patient to check on her since she did start allergy injections. Patient did wait 30 min post injection without problems.

## 2017-04-06 NOTE — Addendum Note (Signed)
Addendum  created 04/06/17 1304 by Lyndle Herrlich, MD   Sign clinical note

## 2017-04-06 NOTE — Anesthesia Postprocedure Evaluation (Signed)
Anesthesia Post Note  Patient: Jaclyn Haynes  Procedure(s) Performed: Procedure(s) (LRB): DILATATION/HYSTEROSCOPY WITH MYOSURE (N/A) ATTEMPTED HYDROTHERMAL ABLATION (N/A)     Anesthesia Post Evaluation  Last Vitals:  Vitals:   02/13/17 1530 02/13/17 1715  BP: 140/89 140/78  Pulse: 85 85  Resp: 20 18  Temp: 37 C 36.7 C    Last Pain:  Vitals:   02/14/17 1740  TempSrc:   PainSc: [place]

## 2017-05-29 DIAGNOSIS — R05 Cough: Secondary | ICD-10-CM | POA: Diagnosis not present

## 2017-05-29 DIAGNOSIS — J019 Acute sinusitis, unspecified: Secondary | ICD-10-CM | POA: Diagnosis not present

## 2017-06-04 ENCOUNTER — Ambulatory Visit (INDEPENDENT_AMBULATORY_CARE_PROVIDER_SITE_OTHER): Payer: 59 | Admitting: Allergy & Immunology

## 2017-06-04 VITALS — BP 138/78 | HR 84 | Resp 16 | Ht 65.0 in | Wt 271.0 lb

## 2017-06-04 DIAGNOSIS — J0100 Acute maxillary sinusitis, unspecified: Secondary | ICD-10-CM

## 2017-06-04 DIAGNOSIS — J3089 Other allergic rhinitis: Secondary | ICD-10-CM

## 2017-06-04 DIAGNOSIS — J4540 Moderate persistent asthma, uncomplicated: Secondary | ICD-10-CM | POA: Diagnosis not present

## 2017-06-04 DIAGNOSIS — T781XXD Other adverse food reactions, not elsewhere classified, subsequent encounter: Secondary | ICD-10-CM

## 2017-06-04 DIAGNOSIS — J302 Other seasonal allergic rhinitis: Secondary | ICD-10-CM

## 2017-06-04 MED ORDER — DOXYCYCLINE HYCLATE 100 MG PO CAPS: 100.0000 mg | ORAL_CAPSULE | Freq: Two times a day (BID) | ORAL | 0 refills | Status: AC

## 2017-06-04 MED ORDER — AZELASTINE-FLUTICASONE 137-50 MCG/ACT NA SUSP: 2.0000 | NASAL | 5 refills | Status: DC

## 2017-06-04 NOTE — Patient Instructions (Addendum)
1. Moderate persistent asthma, uncomplicated - We will not make any medication changes since you are doing well. - Daily controller medication(s): Symbicort 160/4.5 two puffs twice daily with spacer - Rescue medications: ProAir 4 puffs every 4-6 hours as needed - Asthma control goals:  * Full participation in all desired activities (may need albuterol before activity) * Albuterol use two time or less a week on average (not counting use with activity) * Cough interfering with sleep two time or less a month * Oral steroids no more than once a year * No hospitalizations  2. Perennial allergic rhinitis (grasses, weeds, trees, mold, roach, and dust mites) - Stop the Nasacort and start Dymista two sprays per nostril 1-2 times daily.  - Continue with Xyzal 5mg  daily.  - Continue with Mucinex twice daily as needed.  - Hold off on shots since that copay is so high.   3. Acute sinusitis - With your current symptoms and time course, antibiotics are needed: stop the Augmentin and start doxycyline 100mg  twice daily for ten days - Start the prednisone pack provided.  - If symptoms are not improving, feel free to call us or email me, at [email or web address]. - Add on nasal saline spray (i.e., Simply Saline) or nasal saline lavage (i.e., NeilMed) as needed prior to medicated nasal sprays.  4. Adverse food reaction (peanuts, tree nuts, shrimp, tomato) - Continue to avoid peanuts, tree nuts, shrimp, and tomato. - Take notes on any seasonings that cause reactions and we can test in the future. - EpiPen is up to date.  5. Return in about 6 months (around 12/02/2017).  Please inform us of any Emergency Department visits, hospitalizations, or changes in symptoms. Call us before going to the ED for breathing or allergy symptoms since we might be able to fit you in for a sick visit. Feel free to contact us anytime with any questions, problems, or concerns.  It was a pleasure to see you again today!  Enjoy the upcoming fall season! Good luck with your mother!   Websites that have reliable patient information: 1. American Academy of Asthma, Allergy, and Immunology: www.aaaai.org 2. Food Allergy Research and Education (FARE): foodallergy.org 3. Mothers of Asthmatics: http://www.asthmacommunitynetwork.org 4. American College of Allergy, Asthma, and Immunology: www.acaai.org   Election Day is coming up on Tuesday, November 6th! Make your voice heard! Register to vote at vote.org!

## 2017-06-04 NOTE — Progress Notes (Signed)
FOLLOW UP  Date of Service/Encounter:  06/04/17   Assessment:   Moderate persistent asthma without complication  Adverse food reactions (multiple foods) - with negative testing  Acute non-recurrent maxillary sinusitis  Seasonal and perennial allergic rhinitis (grasses, weeds, trees, mold, roach, and dust mites)   Asthma Reportables:  Severity: moderate persistent  Risk: low Control: well controlled  Plan/Recommendations:   1. Moderate persistent asthma, uncomplicated - We will not make any medication changes since you are doing well. - Daily controller medication(s): Symbicort 160/4.5 two puffs twice daily with spacer - Rescue medications: ProAir 4 puffs every 4-6 hours as needed - Asthma control goals:  * Full participation in all desired activities (may need albuterol before activity) * Albuterol use two time or less a week on average (not counting use with activity) * Cough interfering with sleep two time or less a month * Oral steroids no more than once a year * No hospitalizations  2. Perennial allergic rhinitis (grasses, weeds, trees, mold, roach, and dust mites) - Stop the Nasacort and start Dymista two sprays per nostril 1-2 times daily.  - Continue with Xyzal 5mg  daily.  - Continue with Mucinex twice daily as needed.  - Hold off on shots since that copay is so high.   3. Acute sinusitis - With your current symptoms and time course, antibiotics are needed: stop the Augmentin and start doxycyline 100mg  twice daily for ten days - Start the prednisone pack provided.  - If symptoms are not improving, feel free to call us or email me, at [email or web address]. - Add on nasal saline spray (i.e., Simply Saline) or nasal saline lavage (i.e., NeilMed) as needed prior to medicated nasal sprays.  4. Adverse food reaction (peanuts, tree nuts, shrimp, tomato) - Continue to avoid peanuts, tree nuts, shrimp, and tomato. - Take notes on any seasonings that cause  reactions and we can test in the future. - EpiPen is up to date.  5. Return in about 6 months (around 12/02/2017).   Subjective:   Jaclyn Haynes is a 46 y.o. female presenting today for follow up of  Chief Complaint  Patient presents with  . Asthma    has been doing pretty well. does have a sinus infection at the moment.     Jaclyn Haynes has a history of the following: Patient Active Problem List   Diagnosis Date Noted  . Angioedema of lips 05/21/2016  . Allergic reaction 05/21/2016  . Palpitation 08/04/2015  . Migraine without aura and without status migrainosus, not intractable 10/27/2014  . Dizziness and giddiness 10/27/2013  . Disturbance of skin sensation 10/27/2013    History obtained from: chart review and patient.  Dumas Stegmaier's Primary Care Provider is [place], [place], PA-C.     Jaclyn Haynes is a 46 y.o. female presenting for a follow up visit. She was last seen in June 2018. At that time, we continued her on Symbicort 160/4.5 two puffs twice daily with spacer as well as ProAir as needed. For her allergic rhinitis, we continued her on Nasacort one spray per nostril daily, Xyzal 5mg  daily, and Mucinex as needed. We did discuss allergen immunotherapy, and she ended up starting her shots. She has a history of adverse food reactions with multiple triggers,   Since the last visit, she has mostly done fairly well. She did receive on allergy shot, but evidently her co-payment for each shot visit is $35. She did not feel that she could afford this, therefore she stopped taking  them. She remains on the Nasacort one spray per nostril daily as well as Xyzal 5mg  daily. She has been on Flonase in the past, but she endorsed burning with this medication.   She does report sinus pressure and pain that started on Wednesday. She was seen in Urgent Care and started on Augmentin. However she has had persistent diarrhea since starting te Augmentin. She has continued to take it, but she is  only taking one pill daily instead of twice daily. Her symptoms have continued to worsen. She remains on her nose sprays as well as nasal saline and Mucinex with minimal improvement.  Asthma is under good control. She remains on Symbicort 160/4.5 two puffs BID. [name]'s asthma has been well controlled. She has not required rescue medication, experienced nocturnal awakenings due to lower respiratory symptoms, nor have activities of daily living been limited. She has required no Emergency Department or Urgent Care visits for her asthma. She has required zero courses of systemic steroids for asthma exacerbations since the last visit. ACT score today is 18, indicating subpar asthma symptom control, likely secondary to her recent illness.    Otherwise, there have been no changes to her past medical history, surgical history, family history, or social history.    Review of Systems: a 14-point review of systems is pertinent for what is mentioned in HPI.  Otherwise, all other systems were negative. Constitutional: negative other than that listed in the HPI Eyes: negative other than that listed in the HPI Ears, nose, mouth, throat, and face: negative other than that listed in the HPI Respiratory: negative other than that listed in the HPI Cardiovascular: negative other than that listed in the HPI Gastrointestinal: negative other than that listed in the HPI Genitourinary: negative other than that listed in the HPI Integument: negative other than that listed in the HPI Hematologic: negative other than that listed in the HPI Musculoskeletal: negative other than that listed in the HPI Neurological: negative other than that listed in the HPI Allergy/Immunologic: negative other than that listed in the HPI    Objective:   Blood pressure 138/78, pulse 84, resp. rate 16, height 5\' 5"  (1.651 m), weight 271 lb (122.9 kg), SpO2 98 %. Body mass index is 45.1 kg/m.   Physical Exam:  General: Alert,  interactive, in no acute distress. Smiling and pleasant female.  Eyes: No conjunctival injection present on the right, No conjunctival injection present on the left, PERRL bilaterally, No discharge on the right, No discharge on the left and No Horner-Trantas dots present Ears: Right TM pearly gray with normal light reflex, Left TM pearly gray with normal light reflex, Right TM intact without perforation and Left TM intact without perforation.  Nose/Throat: External nose within normal limits and septum midline, turbinates edematous and pale with clear discharge, post-pharynx erythematous with cobblestoning in the posterior oropharynx. Tonsils 2+ without exudates Neck: Supple without thyromegaly. Lungs: Clear to auscultation without wheezing, rhonchi or rales. No increased work of breathing. CV: Normal S1/S2, no murmurs. Capillary refill <2 seconds.  Skin: Warm and dry, without lesions or rashes. Neuro:   Grossly intact. No focal deficits appreciated. Responsive to questions.   Diagnostic studies:   Spirometry: results normal (FEV1: 1.77/71%, FVC: 2.27/73%, FEV1/FVC: 78%).    Spirometry consistent with normal pattern.   Allergy Studies: none      Salvatore Marvel, MD [facility]

## 2017-06-08 ENCOUNTER — Other Ambulatory Visit: Payer: Self-pay

## 2017-06-08 MED ORDER — AZELASTINE HCL 0.1 % NA SOLN: 2.0000 | Freq: Two times a day (BID) | NASAL | 5 refills | Status: DC

## 2017-06-08 MED ORDER — FLUTICASONE PROPIONATE 50 MCG/ACT NA SUSP: 1.0000 | Freq: Every day | NASAL | 5 refills | Status: DC | PRN

## 2017-06-08 NOTE — Telephone Encounter (Signed)
Received fax from [facility] in regards to a PA for [place]. I sent in Astelin and Flonase.

## 2017-08-20 DIAGNOSIS — K219 Gastro-esophageal reflux disease without esophagitis: Secondary | ICD-10-CM | POA: Diagnosis not present

## 2017-08-20 DIAGNOSIS — Z13220 Encounter for screening for lipoid disorders: Secondary | ICD-10-CM | POA: Diagnosis not present

## 2017-08-20 DIAGNOSIS — Z131 Encounter for screening for diabetes mellitus: Secondary | ICD-10-CM | POA: Diagnosis not present

## 2017-08-20 DIAGNOSIS — J45909 Unspecified asthma, uncomplicated: Secondary | ICD-10-CM | POA: Diagnosis not present

## 2017-08-20 DIAGNOSIS — J309 Allergic rhinitis, unspecified: Secondary | ICD-10-CM | POA: Diagnosis not present

## 2017-08-20 DIAGNOSIS — Z0000 Encounter for general adult medical examination without abnormal findings: Secondary | ICD-10-CM | POA: Diagnosis not present

## 2017-09-11 DIAGNOSIS — H1131 Conjunctival hemorrhage, right eye: Secondary | ICD-10-CM | POA: Diagnosis not present

## 2017-10-23 DIAGNOSIS — R300 Dysuria: Secondary | ICD-10-CM | POA: Diagnosis not present

## 2017-10-23 DIAGNOSIS — Z01419 Encounter for gynecological examination (general) (routine) without abnormal findings: Secondary | ICD-10-CM | POA: Diagnosis not present

## 2017-10-23 DIAGNOSIS — Z1231 Encounter for screening mammogram for malignant neoplasm of breast: Secondary | ICD-10-CM | POA: Diagnosis not present

## 2017-11-20 DIAGNOSIS — J45909 Unspecified asthma, uncomplicated: Secondary | ICD-10-CM | POA: Diagnosis not present

## 2017-11-20 DIAGNOSIS — K219 Gastro-esophageal reflux disease without esophagitis: Secondary | ICD-10-CM | POA: Diagnosis not present

## 2017-11-20 DIAGNOSIS — J309 Allergic rhinitis, unspecified: Secondary | ICD-10-CM | POA: Diagnosis not present

## 2017-11-21 DIAGNOSIS — R102 Pelvic and perineal pain: Secondary | ICD-10-CM | POA: Diagnosis not present

## 2017-12-12 DIAGNOSIS — G479 Sleep disorder, unspecified: Secondary | ICD-10-CM | POA: Diagnosis not present

## 2017-12-12 DIAGNOSIS — I10 Essential (primary) hypertension: Secondary | ICD-10-CM | POA: Diagnosis not present

## 2017-12-12 DIAGNOSIS — E538 Deficiency of other specified B group vitamins: Secondary | ICD-10-CM | POA: Diagnosis not present

## 2017-12-12 DIAGNOSIS — G43009 Migraine without aura, not intractable, without status migrainosus: Secondary | ICD-10-CM | POA: Diagnosis not present

## 2018-01-28 DIAGNOSIS — L810 Postinflammatory hyperpigmentation: Secondary | ICD-10-CM | POA: Diagnosis not present

## 2018-01-28 DIAGNOSIS — L700 Acne vulgaris: Secondary | ICD-10-CM | POA: Diagnosis not present

## 2018-02-18 DIAGNOSIS — R52 Pain, unspecified: Secondary | ICD-10-CM | POA: Diagnosis not present

## 2018-02-18 DIAGNOSIS — J029 Acute pharyngitis, unspecified: Secondary | ICD-10-CM | POA: Diagnosis not present

## 2018-02-18 DIAGNOSIS — J040 Acute laryngitis: Secondary | ICD-10-CM | POA: Diagnosis not present

## 2018-03-12 DIAGNOSIS — G478 Other sleep disorders: Secondary | ICD-10-CM | POA: Diagnosis not present

## 2018-03-12 DIAGNOSIS — G4700 Insomnia, unspecified: Secondary | ICD-10-CM | POA: Diagnosis not present

## 2018-03-12 DIAGNOSIS — R0683 Snoring: Secondary | ICD-10-CM | POA: Diagnosis not present

## 2018-03-20 DIAGNOSIS — H9209 Otalgia, unspecified ear: Secondary | ICD-10-CM | POA: Diagnosis not present

## 2018-03-20 DIAGNOSIS — R6884 Jaw pain: Secondary | ICD-10-CM | POA: Diagnosis not present

## 2018-03-27 DIAGNOSIS — R3915 Urgency of urination: Secondary | ICD-10-CM | POA: Diagnosis not present

## 2018-03-27 DIAGNOSIS — N760 Acute vaginitis: Secondary | ICD-10-CM | POA: Diagnosis not present

## 2018-04-09 ENCOUNTER — Ambulatory Visit (INDEPENDENT_AMBULATORY_CARE_PROVIDER_SITE_OTHER): Payer: 59

## 2018-04-09 ENCOUNTER — Ambulatory Visit: Payer: 59 | Admitting: Urgent Care

## 2018-04-09 ENCOUNTER — Encounter: Payer: Self-pay | Admitting: Urgent Care

## 2018-04-09 VITALS — BP 125/80 | HR 91 | Temp 99.1°F | Resp 17 | Ht 65.5 in | Wt 257.0 lb

## 2018-04-09 DIAGNOSIS — R0989 Other specified symptoms and signs involving the circulatory and respiratory systems: Secondary | ICD-10-CM

## 2018-04-09 DIAGNOSIS — R042 Hemoptysis: Secondary | ICD-10-CM | POA: Diagnosis not present

## 2018-04-09 DIAGNOSIS — R05 Cough: Secondary | ICD-10-CM | POA: Diagnosis not present

## 2018-04-09 DIAGNOSIS — B9789 Other viral agents as the cause of diseases classified elsewhere: Secondary | ICD-10-CM

## 2018-04-09 DIAGNOSIS — J3089 Other allergic rhinitis: Secondary | ICD-10-CM

## 2018-04-09 DIAGNOSIS — J069 Acute upper respiratory infection, unspecified: Secondary | ICD-10-CM | POA: Diagnosis not present

## 2018-04-09 DIAGNOSIS — J4540 Moderate persistent asthma, uncomplicated: Secondary | ICD-10-CM

## 2018-04-09 DIAGNOSIS — R058 Other specified cough: Secondary | ICD-10-CM

## 2018-04-09 DIAGNOSIS — J9801 Acute bronchospasm: Secondary | ICD-10-CM

## 2018-04-09 MED ORDER — BENZONATATE 100 MG PO CAPS: 100.0000 mg | ORAL_CAPSULE | Freq: Three times a day (TID) | ORAL | 0 refills | Status: DC | PRN

## 2018-04-09 MED ORDER — PREDNISONE 20 MG PO TABS: ORAL_TABLET | ORAL | 0 refills | Status: DC

## 2018-04-09 MED ORDER — HYDROCODONE-HOMATROPINE 5-1.5 MG/5ML PO SYRP: 5.0000 mL | ORAL_SOLUTION | Freq: Every evening | ORAL | 0 refills | Status: DC | PRN

## 2018-04-09 NOTE — Progress Notes (Signed)
MRN: [id number] DOB: September 28, 1970  Subjective:   Jaclyn Haynes is a 47 y.o. female presenting for 5 day history of productive cough with 1 instance of hemoptysis, coughing fits. Cough is interrupting sleep, has shob with coughing fits. Has associated runny nose, throat pain, hoarseness, subjective fever, intermittent headaches, malaise. Has tried APAP, ibuprofen. Denies chest pain, sinus pain, sinus congestion, n/v, abdominal pain. Has a history of asthma, uses this once daily. Has started using albuterol inhaler twice daily since being ill. Also maintains her Symbicort. Denies smoking cigarettes. Also take Xyzal, Singulair, Nasacort for allergy control.   Jaclyn Haynes has a current medication list which includes the following prescription(s): acetaminophen, albuterol, ashlyna, budesonide-formoterol, calcium carbonate, cetirizine, cyclobenzaprine, dimenhydrinate, diphenhydramine, epinephrine, glucosamine-chondroitin, ibuprofen, levocetirizine, lysine, methocarbamol, metoprolol succinate, montelukast, multiple vitamins-minerals, naproxen, naratriptan, probiotic daily, ranitidine, valacyclovir, and vitamin b-12. Also is allergic to peanut oil; peanut-containing drug products; pollen extract; shellfish allergy; tomato; bee venom; and other.  Jaclyn Haynes  has a past medical history of Abnormal Pap smear, Allergy, Anemia, Arthritis, Asthma, Bladder infection, BV (bacterial vaginosis), Chlamydia, Fibroids, GERD (gastroesophageal reflux disease), Headache, Heart murmur, Hernia, History of hiatal hernia, HSV-2 infection, Irregular heart beat, PONV (postoperative nausea and vomiting), Vitamin B 12 deficiency, and Yeast infection. Also  has a past surgical history that includes Breast biopsy; Breast lumpectomy (Right); Cholecystectomy; Colonoscopy; Dilatation & curettage/hysteroscopy with myosure (N/A, 02/13/2017); and Hysteroscopy (N/A, 02/13/2017).  Objective:   Vitals: BP 125/80   Pulse 91   Temp 99.1 F (37.3 C)  (Oral)   Resp 17   Ht 5' 5.5" (1.664 m)   Wt 257 lb (116.6 kg)   SpO2 98%   BMI 42.12 kg/m   Physical Exam  Constitutional: She is oriented to person, place, and time. She appears well-developed and well-nourished.  HENT:  Throat with thick streaks of postnasal drainage but otherwise patient does not have exudates or tonsillar erythema or swelling.  Eyes: Right eye exhibits no discharge. Left eye exhibits no discharge. No scleral icterus.  Neck: Normal range of motion. Neck supple.  Cardiovascular: Normal rate, regular rhythm, normal heart sounds and intact distal pulses. Exam reveals no gallop and no friction rub.  No murmur heard. Pulmonary/Chest: Effort normal and breath sounds normal. No stridor. No respiratory distress. She has no wheezes. She has no rales.  Neurological: She is alert and oriented to person, place, and time.  Skin: Skin is warm and dry.  Psychiatric: She has a normal mood and affect.    Dg Chest 2 View  Result Date: 04/09/2018 CLINICAL DATA:  Productive cough. EXAM: CHEST - 2 VIEW COMPARISON:  12/12/2012. FINDINGS: Mediastinum and hilar structures normal. Lungs are clear. No pleural effusion or pneumothorax. Heart size normal. Surgical clips right upper quadrant. IMPRESSION: No acute cardiopulmonary disease. Electronically Signed   By: Bruce   On: 04/09/2018 10:26   Assessment and Plan :   Viral URI with cough  Hemoptysis - Plan: DG Chest 2 View  Productive cough - Plan: DG Chest 2 View  Runny nose  Bronchospasm  Moderate persistent extrinsic asthma without complication  Allergic rhinitis due to other allergic trigger, unspecified seasonality  Physical exam findings consistent with viral URI.  Offered patient strep test given her throat pain but she declined this.  Will use a short steroid course, cough suppression medications.  She is to maintain all her asthma and allergy medications. Counseled patient on potential for adverse effects with  medications prescribed today, patient verbalized understanding. Return-to-clinic precautions  discussed, patient verbalized understanding.   Jaynee Eagles, PA-C [facility] [phone] 04/09/2018  10:11 AM

## 2018-04-09 NOTE — Patient Instructions (Addendum)
Cough, Adult A cough helps to clear your throat and lungs. A cough may last only 2-3 weeks (acute), or it may last longer than 8 weeks (chronic). Many different things can cause a cough. A cough may be a sign of an illness or another medical condition. Follow these instructions at home:  Pay attention to any changes in your cough.  Take medicines only as told by your doctor. ? If you were prescribed an antibiotic medicine, take it as told by your doctor. Do not stop taking it even if you start to feel better. ? Talk with your doctor before you try using a cough medicine.  Drink enough fluid to keep your pee (urine) clear or pale yellow.  If the air is dry, use a cold steam vaporizer or humidifier in your home.  Stay away from things that make you cough at work or at home.  If your cough is worse at night, try using extra pillows to raise your head up higher while you sleep.  Do not smoke, and try not to be around smoke. If you need help quitting, ask your doctor.  Do not have caffeine.  Do not drink alcohol.  Rest as needed. Contact a doctor if:  You have new problems (symptoms).  You cough up yellow fluid (pus).  Your cough does not get better after 2-3 weeks, or your cough gets worse.  Medicine does not help your cough and you are not sleeping well.  You have pain that gets worse or pain that is not helped with medicine.  You have a fever.  You are losing weight and you do not know why.  You have night sweats. Get help right away if:  You cough up blood.  You have trouble breathing.  Your heartbeat is very fast. This information is not intended to replace advice given to you by your health care provider. Make sure you discuss any questions you have with your health care provider. Document Released: 05/25/2011 Document Revised: 02/17/2016 Document Reviewed: 11/18/2014 Elsevier Interactive Patient Education  2018 Elsevier Inc.     IF you received an x-ray  today, you will receive an invoice from [facility]. Please contact [facility] at [phone] with questions or concerns regarding your invoice.   IF you received labwork today, you will receive an invoice from [facility]. Please contact [facility] at [phone] with questions or concerns regarding your invoice.   Our billing staff will not be able to assist you with questions regarding bills from these companies.  You will be contacted with the lab results as soon as they are available. The fastest way to get your results is to activate your My Chart account. Instructions are located on the last page of this paperwork. If you have not heard from us regarding the results in 2 weeks, please contact this office.      

## 2018-05-01 DIAGNOSIS — L820 Inflamed seborrheic keratosis: Secondary | ICD-10-CM | POA: Diagnosis not present

## 2018-05-01 DIAGNOSIS — L700 Acne vulgaris: Secondary | ICD-10-CM | POA: Diagnosis not present

## 2018-05-21 DIAGNOSIS — J45909 Unspecified asthma, uncomplicated: Secondary | ICD-10-CM | POA: Diagnosis not present

## 2018-05-21 DIAGNOSIS — J309 Allergic rhinitis, unspecified: Secondary | ICD-10-CM | POA: Diagnosis not present

## 2018-05-21 DIAGNOSIS — R51 Headache: Secondary | ICD-10-CM | POA: Diagnosis not present

## 2018-06-03 DIAGNOSIS — D234 Other benign neoplasm of skin of scalp and neck: Secondary | ICD-10-CM | POA: Diagnosis not present

## 2018-06-03 DIAGNOSIS — L821 Other seborrheic keratosis: Secondary | ICD-10-CM | POA: Diagnosis not present

## 2018-06-03 DIAGNOSIS — L918 Other hypertrophic disorders of the skin: Secondary | ICD-10-CM | POA: Diagnosis not present

## 2018-06-03 DIAGNOSIS — R208 Other disturbances of skin sensation: Secondary | ICD-10-CM | POA: Diagnosis not present

## 2018-07-02 DIAGNOSIS — G4733 Obstructive sleep apnea (adult) (pediatric): Secondary | ICD-10-CM | POA: Diagnosis not present

## 2018-07-02 DIAGNOSIS — I10 Essential (primary) hypertension: Secondary | ICD-10-CM | POA: Diagnosis not present

## 2018-07-02 DIAGNOSIS — G4709 Other insomnia: Secondary | ICD-10-CM | POA: Diagnosis not present

## 2018-07-09 DIAGNOSIS — K219 Gastro-esophageal reflux disease without esophagitis: Secondary | ICD-10-CM | POA: Diagnosis not present

## 2018-08-14 DIAGNOSIS — R635 Abnormal weight gain: Secondary | ICD-10-CM | POA: Diagnosis not present

## 2018-08-26 DIAGNOSIS — J45909 Unspecified asthma, uncomplicated: Secondary | ICD-10-CM | POA: Diagnosis not present

## 2018-08-26 DIAGNOSIS — R51 Headache: Secondary | ICD-10-CM | POA: Diagnosis not present

## 2018-08-26 DIAGNOSIS — Z0000 Encounter for general adult medical examination without abnormal findings: Secondary | ICD-10-CM | POA: Diagnosis not present

## 2018-08-26 DIAGNOSIS — R3915 Urgency of urination: Secondary | ICD-10-CM | POA: Diagnosis not present

## 2018-08-26 DIAGNOSIS — Z13220 Encounter for screening for lipoid disorders: Secondary | ICD-10-CM | POA: Diagnosis not present

## 2018-09-03 DIAGNOSIS — G4733 Obstructive sleep apnea (adult) (pediatric): Secondary | ICD-10-CM | POA: Diagnosis not present

## 2018-10-04 DIAGNOSIS — G4733 Obstructive sleep apnea (adult) (pediatric): Secondary | ICD-10-CM | POA: Diagnosis not present

## 2018-10-24 DIAGNOSIS — G4700 Insomnia, unspecified: Secondary | ICD-10-CM | POA: Diagnosis not present

## 2018-10-24 DIAGNOSIS — G4733 Obstructive sleep apnea (adult) (pediatric): Secondary | ICD-10-CM | POA: Diagnosis not present

## 2018-10-25 DIAGNOSIS — Z01419 Encounter for gynecological examination (general) (routine) without abnormal findings: Secondary | ICD-10-CM | POA: Diagnosis not present

## 2018-10-25 DIAGNOSIS — Z124 Encounter for screening for malignant neoplasm of cervix: Secondary | ICD-10-CM | POA: Diagnosis not present

## 2018-10-25 DIAGNOSIS — Z1231 Encounter for screening mammogram for malignant neoplasm of breast: Secondary | ICD-10-CM | POA: Diagnosis not present

## 2018-10-25 DIAGNOSIS — R350 Frequency of micturition: Secondary | ICD-10-CM | POA: Diagnosis not present

## 2018-11-04 DIAGNOSIS — L700 Acne vulgaris: Secondary | ICD-10-CM | POA: Diagnosis not present

## 2018-11-04 DIAGNOSIS — G4733 Obstructive sleep apnea (adult) (pediatric): Secondary | ICD-10-CM | POA: Diagnosis not present

## 2018-11-04 DIAGNOSIS — L810 Postinflammatory hyperpigmentation: Secondary | ICD-10-CM | POA: Diagnosis not present

## 2018-11-11 DIAGNOSIS — N644 Mastodynia: Secondary | ICD-10-CM | POA: Diagnosis not present

## 2018-12-03 DIAGNOSIS — G4733 Obstructive sleep apnea (adult) (pediatric): Secondary | ICD-10-CM | POA: Diagnosis not present

## 2018-12-05 DIAGNOSIS — G4733 Obstructive sleep apnea (adult) (pediatric): Secondary | ICD-10-CM | POA: Diagnosis not present

## 2018-12-13 DIAGNOSIS — T148XXA Other injury of unspecified body region, initial encounter: Secondary | ICD-10-CM | POA: Diagnosis not present

## 2019-01-03 DIAGNOSIS — G4733 Obstructive sleep apnea (adult) (pediatric): Secondary | ICD-10-CM | POA: Diagnosis not present

## 2019-01-06 DIAGNOSIS — G4733 Obstructive sleep apnea (adult) (pediatric): Secondary | ICD-10-CM | POA: Diagnosis not present

## 2019-01-23 DIAGNOSIS — Z9989 Dependence on other enabling machines and devices: Secondary | ICD-10-CM | POA: Diagnosis not present

## 2019-01-23 DIAGNOSIS — G4733 Obstructive sleep apnea (adult) (pediatric): Secondary | ICD-10-CM | POA: Diagnosis not present

## 2019-02-02 DIAGNOSIS — G4733 Obstructive sleep apnea (adult) (pediatric): Secondary | ICD-10-CM | POA: Diagnosis not present

## 2019-03-11 ENCOUNTER — Encounter: Payer: Self-pay | Admitting: Allergy & Immunology

## 2019-03-11 ENCOUNTER — Ambulatory Visit (INDEPENDENT_AMBULATORY_CARE_PROVIDER_SITE_OTHER): Payer: 59 | Admitting: Allergy & Immunology

## 2019-03-11 ENCOUNTER — Other Ambulatory Visit: Payer: Self-pay

## 2019-03-11 DIAGNOSIS — J302 Other seasonal allergic rhinitis: Secondary | ICD-10-CM | POA: Diagnosis not present

## 2019-03-11 DIAGNOSIS — T781XXD Other adverse food reactions, not elsewhere classified, subsequent encounter: Secondary | ICD-10-CM

## 2019-03-11 DIAGNOSIS — J4540 Moderate persistent asthma, uncomplicated: Secondary | ICD-10-CM

## 2019-03-11 DIAGNOSIS — J3089 Other allergic rhinitis: Secondary | ICD-10-CM

## 2019-03-11 MED ORDER — EPINEPHRINE 0.3 MG/0.3ML IJ SOAJ: 0.3000 mg | INTRAMUSCULAR | 1 refills | Status: DC | PRN

## 2019-03-11 MED ORDER — BUDESONIDE-FORMOTEROL FUMARATE 160-4.5 MCG/ACT IN AERO: 2.0000 | INHALATION_SPRAY | Freq: Two times a day (BID) | RESPIRATORY_TRACT | 5 refills | Status: DC

## 2019-03-11 NOTE — Patient Instructions (Addendum)
1. Moderate persistent asthma, uncomplicated - We will not make any medication changes. - Daily controller medication(s): Symbicort 160/4.5 two puffs twice daily with spacer - Rescue medications: ProAir 4 puffs every 4-6 hours as needed - Asthma control goals:  * Full participation in all desired activities (may need albuterol before activity) * Albuterol use two time or less a week on average (not counting use with activity) * Cough interfering with sleep two time or less a month * Oral steroids no more than once a year * No hospitalizations  2. Perennial allergic rhinitis (grasses, weeds, trees, mold, roach, and dust mites) - Continue with Nasacort OTC 1-2 sprays per nostril daily.  - Continue with Xyzal 5mg  daily (you can use one twice daily if needed).   3. Adverse food reaction (peanuts, tree nuts, shrimp, tomato) - Continue to avoid peanuts, tree nuts, shrimp, and tomato. - We will send in a prescription for AuviQ (epinephrine autoinjector).  - Anaphylaxis management plan updated.  - Consider adding on Xolair to your medication regimen to decrease the episodes of the cross contamination. - Consider oral immunotherapy if needed (information provided).   4. Return in about 3 months (around 06/11/2019). This can be an in-person, a virtual Webex or a telephone follow up visit.   Please inform us of any Emergency Department visits, hospitalizations, or changes in symptoms. Call us before going to the ED for breathing or allergy symptoms since we might be able to fit you in for a sick visit. Feel free to contact us anytime with any questions, problems, or concerns.  It was a pleasure to talk to you today today!  Websites that have reliable patient information: 1. American Academy of Asthma, Allergy, and Immunology: www.aaaai.org 2. Food Allergy Research and Education (FARE): foodallergy.org 3. Mothers of Asthmatics: http://www.asthmacommunitynetwork.org 4. American College of Allergy,  Asthma, and Immunology: www.acaai.org  "Like" [place] on Facebook and Instagram for our latest updates!      Make sure you are registered to vote! If you have moved or changed any of your contact information, you will need to get this updated before voting!    Voter ID laws are NOT going into effect for the General Election in November 2020! DO NOT let this stop you from exercising your right to vote!   Absentee voting is the SAFEST way to vote during the coronavirus pandemic! Download and print an absentee ballot request form at https://s3.[email or web address]/dl.[email or web address]  More information on absentee ballots can be found here: [email or web address].ncvoter.org/[email or web address]-[email or web address]

## 2019-03-11 NOTE — Progress Notes (Signed)
RE: Jaclyn Haynes MRN: [id number] DOB: October 11, 1970 Date of Telemedicine Visit: 03/11/2019  Referring provider: Lennie Odor, PA-C Primary care provider: Lennie Odor, PA-C  Chief Complaint: Follow-up (asthma)   Telemedicine Follow Up Visit via Telephone: I connected with Jaclyn Haynes for a follow up on 03/11/19 by telephone and verified that I am speaking with the correct person using two identifiers.   I discussed the limitations, risks, security and privacy concerns of performing an evaluation and management service by telephone and the availability of in person appointments. I also discussed with the patient that there may be a patient responsible charge related to this service. The patient expressed understanding and agreed to proceed.  Patient is at home.  Provider is at the office.  Visit start time: 10:32 AM Visit end time: 11:00 AM Insurance consent/check in by: Jaclyn Haynes Medical consent and medical assistant/nurse: Jaclyn Haynes  History of Present Illness:   Jaclyn Haynes is a 48 y.o. female presenting for a follow up visit.  She was last seen in September 2018.  At that time, she was doing very well on Symbicort 160/4.5 mcg 2 puffs twice daily with a spacer.  She also had albuterol to use as needed.  She has a history of allergic rhinitis with sensitizations to grasses, weeds, trees, molds, roach, and dust mite.  We stopped her Nasacort and started Dymista 2 sprays per nostril up to twice daily.  We also continued Xyzal 5 mg daily.  Her co-pay for allergen immunotherapy was very high, so she decided to stop.  We did treat her for sinusitis at that time.  We stopped her Augmentin and started doxycycline 100 mg twice daily.  We also gave her a prednisone Dosepak.  She has a history of anaphylaxis to peanuts, tree nuts, shrimp, and tomato.  Her EpiPen was up-to-date.  Since the last visit, she has mostly done well. She does report that she is having hives fairly often when she goes   Asthma/Respiratory Symptom History: She reports that she had a bad spell during February and March. She found a sample of the Symbicort which helped [place] to get through this period. She feels that the addition of the Symbicort has helped tremendously. She reports that the Symbicort helps her symptoms quite a bit. When she is able to take it continuously, her symptoms are nearly absent. She has not needed prednisone at all since the last visit, but she thinks that she might have needed it during her worst time during [place] pandemic. She never had a fever and her symptoms felt most consistent with uncontrolled asthma.   Allergic Rhinitis Symptom History: She has not been well controlled from an allergic rhinitis perspective. She tells me that she is becoming more sensitive to everything that she is allergic to.  Food Allergy Symptom History: She did have a problem when she ate a piece of cake and ice cream. She developed numbing of her lips, mouth, and foot. She did not have an up to date epinephrine autoinjector. She did take some Benadryl which did not work immediately. She felt somewhat more normal around 7 hours later. She never had problems with traces previously.   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 She is working remotely at this time and has not done any home visits since the [place] pandemic started.   Otherwise, there have been no changes to her past medical history, surgical history, family history, or social history.   Assessment  and Plan:  Jaclyn Haynes is a 48 y.o. female with:  Moderate persistent asthma without complication  Adverse food reactions (peanuts, tree nuts, shrimp, tomato) - with low positives to peanut, tree nuts, tomato  Seasonal and perennial allergic rhinitis (grasses, weeds, trees, mold, roach, and dust mites)   Jaclyn Haynes presents to reestablish care.  She has been getting by using samples of the Symbicort which we have  previously provided.  She has been decreasing the dose to make it last longer.  The Symbicort seems to do a good job of keeping her symptoms at [place] and she prefers to remain on this.  We are going to refill this and give her a co-pay card for this.  She is also having some increased reactions to foods, which she thinks is secondary to cross-contamination.  She has previously been able to tolerate foods at a been processed in the same factory as peanuts or tree nuts, but she seems to be reacting to more and more of these products at this point.  I think the addition of Xolair will add an immunomodulatory effect and help both her asthma and her reactions to foods.  We are going to get an IgE level today so this is updated since we use this for dosing.  We are also going to get a complete blood count with differential in case we decide to go with an anti-IL-5 agent for her asthma, although the data suggesting that would help with food allergies is much less robust with the anti-IL-5 agents versus the Xolair.  Some of this reaction could be secondary to oral allergy syndrome since she does have quite a bit of environmental symptoms, will keep this in mind as an etiology as well.  1. Moderate persistent asthma, uncomplicated - We will not make any medication changes. - Daily controller medication(s): Symbicort 160/4.5 two puffs twice daily with spacer - Rescue medications: ProAir 4 puffs every 4-6 hours as needed - Asthma control goals:  * Full participation in all desired activities (may need albuterol before activity) * Albuterol use two time or less a week on average (not counting use with activity) * Cough interfering with sleep two time or less a month * Oral steroids no more than once a year * No hospitalizations  2. Perennial allergic rhinitis (grasses, weeds, trees, mold, roach, and dust mites) - Continue with Nasacort OTC 1-2 sprays per nostril daily.  - Continue with Xyzal 5mg  daily (you can use  one twice daily if needed).   3. Adverse food reaction (peanuts, tree nuts, shrimp, tomato) - Continue to avoid peanuts, tree nuts, shrimp, and tomato. - We will send in a prescription for AuviQ (epinephrine autoinjector).  - Anaphylaxis management plan updated.  - Consider adding on Xolair to your medication regimen to decrease the episodes of the cross contamination. - Consider oral immunotherapy if needed (information provided).   4. Return in about 3 months (around 06/11/2019). This can be an in-person, a virtual Webex or a telephone follow up visit.   Diagnostics: None.  Medication List:  Current Outpatient Medications  Medication Sig Dispense Refill  . acetaminophen (TYLENOL) 650 MG CR tablet Take 1,300 mg by mouth every 8 (eight) hours as needed for pain.    Marland Kitchen albuterol (PROVENTIL HFA;VENTOLIN HFA) 108 (90 BASE) MCG/ACT inhaler Inhale 2 puffs into the lungs every 6 (six) hours as needed for wheezing.    . ASHLYNA 0.15-0.03 &0.01 MG tablet Take 0.15 mg by mouth daily.    Marland Kitchen  budesonide-formoterol (SYMBICORT) 160-4.5 MCG/ACT inhaler Inhale 2 puffs into the lungs 2 (two) times daily. 1 Inhaler 5  . Calcium Carbonate (CALCIUM 600 PO) Take 1 tablet by mouth daily.    . carvedilol (COREG CR) 40 MG 24 hr capsule Take 40 mg by mouth daily.    . cyclobenzaprine (FLEXERIL) 10 MG tablet cyclobenzaprine 10 mg tablet    . dimenhyDRINATE (DRAMAMINE PO) Take by mouth.    . diphenhydrAMINE (BENADRYL) 25 mg capsule Take 1 capsule (25 mg total) by mouth every 6 (six) hours. 30 capsule 0  . famotidine (PEPCID) 10 MG tablet Take 10 mg by mouth 2 (two) times daily.    Marland Kitchen glucosamine-chondroitin 500-400 MG tablet Take by mouth.    Marland Kitchen ibuprofen (ADVIL) 200 MG tablet Take 3 tablets (600 mg total) by mouth every 6 (six) hours as needed. 30 tablet 0  . levocetirizine (XYZAL) 5 MG tablet Take 5 mg by mouth every evening.     Marland Kitchen Lysine 500 MG CAPS Take 500 mg by mouth.    . montelukast (SINGULAIR) 10 MG tablet  TK 1 T PO QD IN THE EVENING  5  . omeprazole (PRILOSEC) 10 MG capsule Take 10 mg by mouth daily.    . Probiotic Product (PROBIOTIC DAILY) CAPS Hold until Recommended to take by allergy specialist. (Patient taking differently: Take 1 capsule by mouth daily. )    . valACYclovir (VALTREX) 500 MG tablet Take 1 tablet (500 mg total) by mouth daily. Hold until Recommended to take by allergy specialist. (Patient taking differently: Take 500 mg by mouth daily. )    . vitamin B-12 (CYANOCOBALAMIN) 1000 MCG tablet Take 1,000 mcg by mouth daily.     No current facility-administered medications for this visit.    Allergies: Allergies  Allergen Reactions  . Peanut Oil Anaphylaxis    Other reaction(s): Angioedema  . Peanut-Containing Drug Products Anaphylaxis and Hives  . Pollen Extract   . Shellfish Allergy   . Tomato Hives    Only raw tomato causes reaction  . Bee Venom Hives, Swelling and Rash    Swelling , more than localized  . Other Other (See Comments)    Allergy Testing Other reaction(s): Other (See Comments) Acidic fruits and vegetables, rash    I reviewed her past medical history, social history, family history, and environmental history and no significant changes have been reported from previous visits.  Review of Systems  Constitutional: Negative.  Negative for fever, malaise/fatigue and weight loss.  HENT: Negative.  Negative for congestion, ear discharge and ear pain.        Positive for postnasal drip.  Eyes: Negative for pain, discharge and redness.  Respiratory: Positive for cough and shortness of breath. Negative for sputum production and wheezing.   Cardiovascular: Negative.  Negative for chest pain and palpitations.  Gastrointestinal: Negative for abdominal pain, constipation, diarrhea, heartburn, nausea and vomiting.  Skin: Negative.  Negative for itching and rash.  Neurological: Negative for dizziness and headaches.  Endo/Heme/Allergies: Negative for environmental  allergies. Does not bruise/bleed easily.   Objective:  Physical exam not obtained as encounter was done via telephone.   Previous notes and tests were reviewed.  I discussed the assessment and treatment plan with the patient. The patient was provided an opportunity to ask questions and all were answered. The patient agreed with the plan and demonstrated an understanding of the instructions.   The patient was advised to call back or seek an in-person evaluation if the symptoms worsen  or if the condition fails to improve as anticipated.  I provided 28 minutes of non-face-to-face time during this encounter.  It was my pleasure to participate in [place] care today. Please feel free to contact me with any questions or concerns.   Sincerely,  Valentina Shaggy, MD

## 2019-03-21 IMAGING — DX DG CHEST 2V
2 series · 2 of 2 positions shown · non-contrast
Comparison: 12/12/2012.

CLINICAL DATA: Productive cough.

EXAM:
CHEST - 2 VIEW

[chest pa]
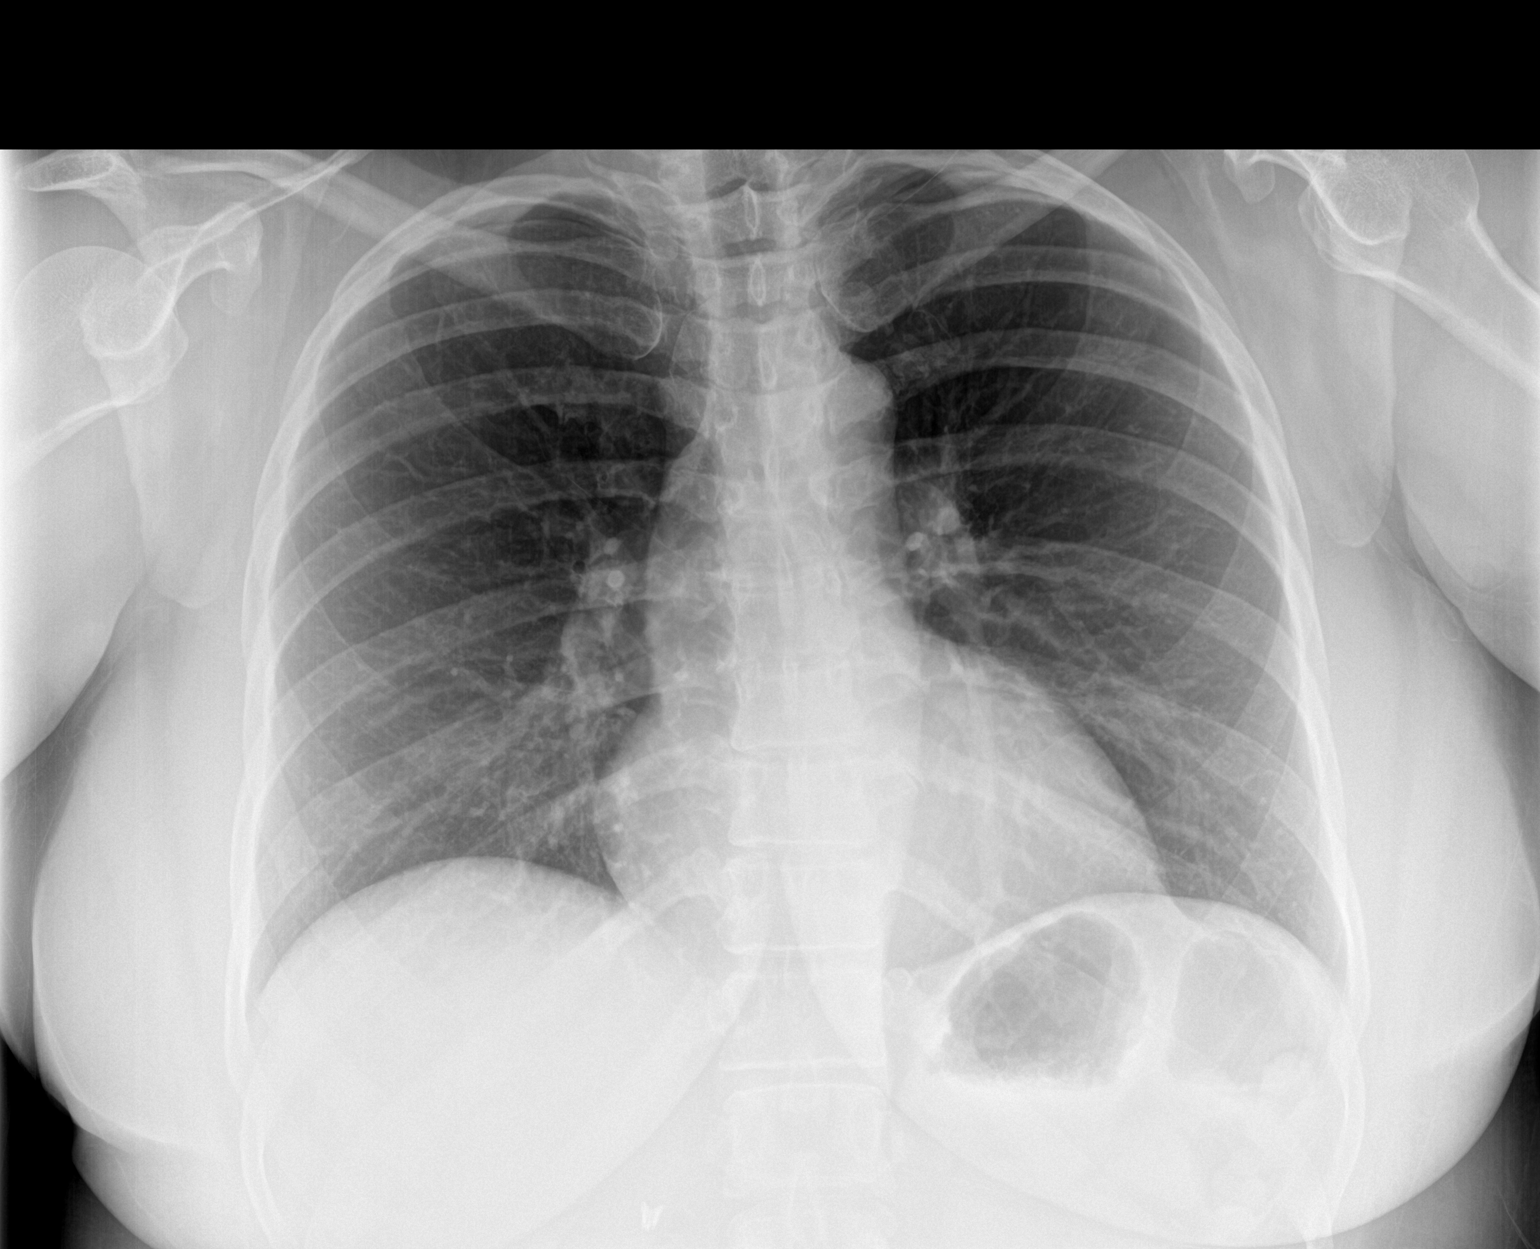

[chest lat]
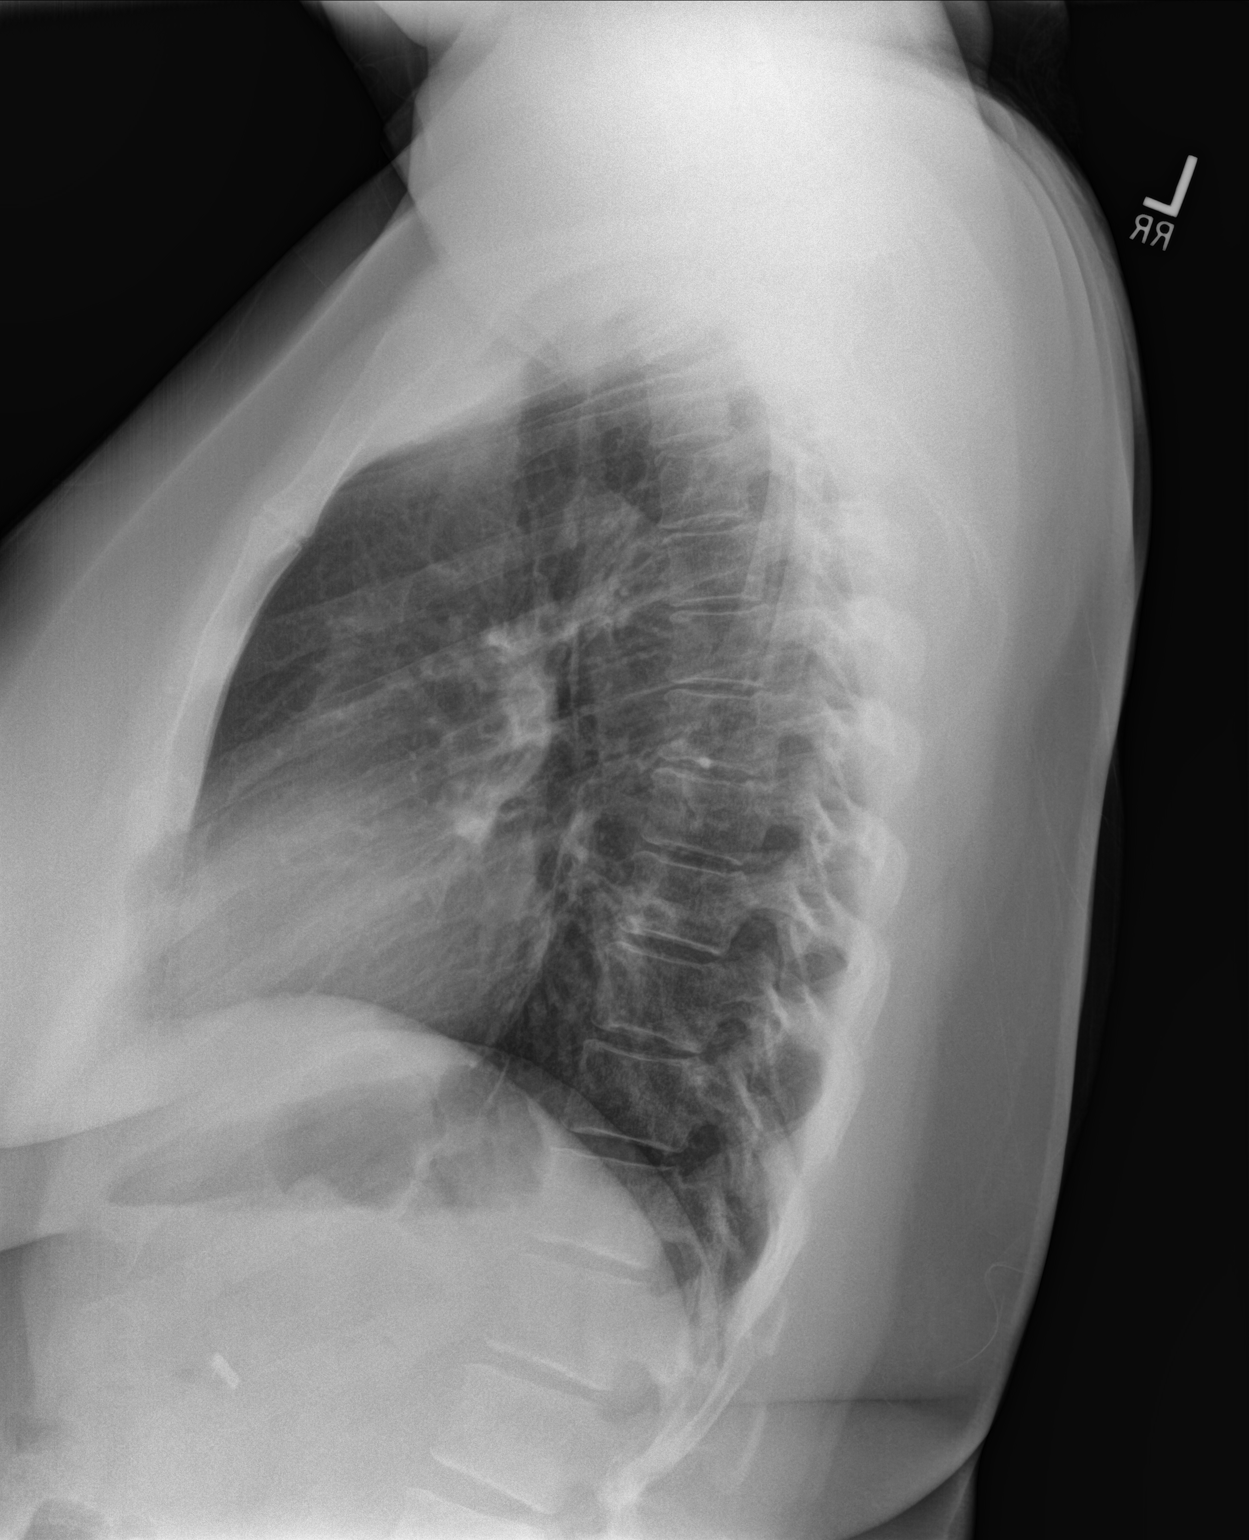

[2 of 2 positions shown; findings below may reference images not displayed]

FINDINGS: Mediastinum and hilar structures normal. Lungs are clear. No pleural
effusion or pneumothorax. Heart size normal. Surgical clips right
upper quadrant.
IMPRESSION: No acute cardiopulmonary disease.

## 2019-03-23 LAB — CBC WITH DIFFERENTIAL/PLATELET
Basophils Absolute: 0 10*3/uL (ref 0.0–0.2)
Basos: 0 %
EOS (ABSOLUTE): 0.1 10*3/uL (ref 0.0–0.4)
Eos: 1 %
Hematocrit: 39 % (ref 34.0–46.6)
Hemoglobin: 11.9 g/dL (ref 11.1–15.9)
Immature Grans (Abs): 0 10*3/uL (ref 0.0–0.1)
Immature Granulocytes: 0 %
Lymphocytes Absolute: 2.4 10*3/uL (ref 0.7–3.1)
Lymphs: 25 %
MCH: 26.8 pg (ref 26.6–33.0)
MCHC: 30.5 g/dL — ABNORMAL LOW (ref 31.5–35.7)
MCV: 88 fL (ref 79–97)
Monocytes Absolute: 0.8 10*3/uL (ref 0.1–0.9)
Monocytes: 9 %
Neutrophils Absolute: 6.1 10*3/uL (ref 1.4–7.0)
Neutrophils: 65 %
Platelets: 347 10*3/uL (ref 150–450)
RBC: 4.44 x10E6/uL (ref 3.77–5.28)
RDW: 13.6 % (ref 11.7–15.4)
WBC: 9.5 10*3/uL (ref 3.4–10.8)

## 2019-03-23 LAB — IGE: IgE (Immunoglobulin E), Serum: 243 IU/mL (ref 6–495)

## 2019-06-03 ENCOUNTER — Encounter: Payer: Self-pay | Admitting: Neurology

## 2019-06-04 ENCOUNTER — Encounter: Payer: Self-pay | Admitting: Neurology

## 2019-06-04 ENCOUNTER — Telehealth (INDEPENDENT_AMBULATORY_CARE_PROVIDER_SITE_OTHER): Payer: 59 | Admitting: Neurology

## 2019-06-04 ENCOUNTER — Other Ambulatory Visit: Payer: Self-pay

## 2019-06-04 VITALS — Ht 64.0 in | Wt 265.0 lb

## 2019-06-04 DIAGNOSIS — R202 Paresthesia of skin: Secondary | ICD-10-CM

## 2019-06-04 DIAGNOSIS — R29818 Other symptoms and signs involving the nervous system: Secondary | ICD-10-CM

## 2019-06-04 DIAGNOSIS — R200 Anesthesia of skin: Secondary | ICD-10-CM | POA: Diagnosis not present

## 2019-06-04 NOTE — Progress Notes (Signed)
Ordered and pended orders

## 2019-06-04 NOTE — Progress Notes (Signed)
New Patient Virtual Visit via Video Note The purpose of this virtual visit is to provide medical care while limiting exposure to the novel coronavirus.    Consent was obtained for video visit:  Yes.   Answered questions that patient had about telehealth interaction:  Yes.   I discussed the limitations, risks, security and privacy concerns of performing an evaluation and management service by telemedicine. I also discussed with the patient that there may be a patient responsible charge related to this service. The patient expressed understanding and agreed to proceed.  Pt location: Home Physician Location: office Name of referring provider:  Lennie Odor, PA-C I connected with Jaclyn Haynes at patients initiation/request on 06/04/2019 at 10:50 AM EDT by video enabled telemedicine application and verified that I am speaking with the correct person using two identifiers. Pt MRN:  [id number] Pt DOB:  [birth year] Video Participants:  Jaclyn Haynes    History of Present Illness: Jaclyn Haynes is a 48 y.o. right-handed female with asthma, GERD, hypertension, and migraine presenting for evaluation of left side numbness.   Starting around the end of July 2020, she began having numbness over left side of the cheek and lips. Over the next week, she began having tingling over the left arm from the elbow into the hand.  By August, she was having numbness involving the left cheek, forearm/hand, and left thigh, lower leg, and foot.  Symptoms would occur 3-5 times per week, lasting anywhere from 30-min to several hours.  Symptoms are triggered by sitting and bending forward.  Her facial numbness has been triggered by resting her head against her headboard.  On two occasions, she had a headache that followed.  There is no associated weakness, change in speech/swallowing, or falls. She has not had similar symptoms in the past.   She takes Ajovy for migraines which has reduced her frequency of  headaches to twice per month.  Headaches typically started on the right side of her headache, worse with bright lights and loud. noises.    She works as a Hydrographic surveyor.   She does not smoke and is not any any special diet.   Out-side paper records, electronic medical record, and images have been reviewed where available and summarized as:   Lab Results  Component Value Date   ESRSEDRATE 5 05/24/2016    Past Medical History:  Diagnosis Date  . Abnormal Pap smear    CIN1  . Allergy   . Anemia   . Angio-edema   . Arthritis   . Asthma   . Bladder infection   . BV (bacterial vaginosis)   . Chlamydia   . Fibroids   . GERD (gastroesophageal reflux disease)   . Headache    Migraines  . Heart murmur   . Hernia   . History of hiatal hernia   . HSV-2 infection   . Irregular heart beat   . PONV (postoperative nausea and vomiting)    during gallbladder surgery, "almost died" per mother   . Vitamin B 12 deficiency   . Yeast infection     Past Surgical History:  Procedure Laterality Date  . BREAST BIOPSY    . BREAST LUMPECTOMY Right   . CHOLECYSTECTOMY    . COLONOSCOPY    . DILATATION & CURETTAGE/HYSTEROSCOPY WITH MYOSURE N/A 02/13/2017   Procedure: DILATATION/HYSTEROSCOPY WITH MYOSURE;  Surgeon: Crawford Givens, MD;  Location: [facility];  Service: Gynecology;  Laterality: N/A;  Clint the  rep will be here.  Confirmed on 02/08/17 with chass  . HYSTEROSCOPY N/A 02/13/2017   Procedure: ATTEMPTED HYDROTHERMAL ABLATION;  Surgeon: Crawford Givens, MD;  Location: [facility];  Service: Gynecology;  Laterality: N/A;  HTA rep will be here      Medications:  Outpatient Encounter Medications as of 06/04/2019  Medication Sig  . acetaminophen (TYLENOL) 650 MG CR tablet Take 1,300 mg by mouth every 8 (eight) hours as needed for pain.  Marland Kitchen albuterol (PROVENTIL HFA;VENTOLIN HFA) 108 (90 BASE) MCG/ACT inhaler Inhale 2 puffs into the lungs every 6 (six) hours as needed for  wheezing.  . ASHLYNA 0.15-0.03 &0.01 MG tablet Take 0.15 mg by mouth daily.  . budesonide-formoterol (SYMBICORT) 160-4.5 MCG/ACT inhaler Inhale 2 puffs into the lungs 2 (two) times daily.  . Calcium Carbonate (CALCIUM 600 PO) Take 1 tablet by mouth daily.  . carvedilol (COREG CR) 40 MG 24 hr capsule Take 40 mg by mouth daily.  . Clindamycin-Benzoyl Per, Refr, gel Apply to face every morning  . cyclobenzaprine (FLEXERIL) 10 MG tablet cyclobenzaprine 10 mg tablet  . dimenhyDRINATE (DRAMAMINE PO) Take by mouth.  . diphenhydrAMINE (BENADRYL) 25 mg capsule Take 1 capsule (25 mg total) by mouth every 6 (six) hours.  Marland Kitchen EPINEPHrine (AUVI-Q) 0.3 mg/0.3 mL IJ SOAJ injection Inject 0.3 mLs (0.3 mg total) into the muscle as needed for anaphylaxis.  . famotidine (PEPCID) 10 MG tablet Take 10 mg by mouth 2 (two) times daily.  Marland Kitchen glucosamine-chondroitin 500-400 MG tablet Take by mouth.  Marland Kitchen ibuprofen (ADVIL) 200 MG tablet Take 3 tablets (600 mg total) by mouth every 6 (six) hours as needed.  Marland Kitchen levocetirizine (XYZAL) 5 MG tablet Take 5 mg by mouth every evening.   Marland Kitchen Lysine 500 MG CAPS Take 500 mg by mouth.  . montelukast (SINGULAIR) 10 MG tablet TK 1 T PO QD IN THE EVENING  . omeprazole (PRILOSEC) 10 MG capsule Take 10 mg by mouth daily.  . Probiotic Product (PROBIOTIC DAILY) CAPS Hold until Recommended to take by allergy specialist. (Patient taking differently: Take 1 capsule by mouth daily. )  . spironolactone (ALDACTONE) 25 MG tablet Take one tablet by mouth daily with food  . tretinoin (RETIN-A) 0.025 % cream Apply a pea-sized amount to entire face every other night to nightly.  . vitamin B-12 (CYANOCOBALAMIN) 1000 MCG tablet Take 1,000 mcg by mouth daily.  Marland Kitchen omeprazole (PRILOSEC) 40 MG capsule TK ONE C PO  BID FOR GERD  . valACYclovir (VALTREX) 500 MG tablet Take 1 tablet (500 mg total) by mouth daily. Hold until Recommended to take by allergy specialist. (Patient not taking: Reported on 06/03/2019)   No  facility-administered encounter medications on file as of 06/04/2019.     Allergies:  Allergies  Allergen Reactions  . Peanut Oil Anaphylaxis    Other reaction(s): Angioedema  . Peanut-Containing Drug Products Anaphylaxis and Hives  . Pollen Extract   . Shellfish Allergy   . Tomato Hives    Only raw tomato causes reaction  . Bee Venom Hives, Swelling and Rash    Swelling , more than localized  . Other Other (See Comments)    Allergy Testing Other reaction(s): Other (See Comments) Acidic fruits and vegetables, rash     Family History: Family History  Problem Relation Age of Onset  . Stroke Mother   . Hypertension Mother   . COPD Mother   . Thyroid nodules Mother   . Hyperlipidemia Mother   . Eczema Mother   .  Heart disease Father   . Hypertension Father   . Hyperlipidemia Father   . Hypertension Maternal Grandmother   . Hyperlipidemia Maternal Grandmother   . Diabetes Maternal Grandmother   . Thyroid nodules Maternal Grandmother   . Hypertension Maternal Grandfather   . Hyperlipidemia Maternal Grandfather   . Prostate cancer Maternal Grandfather   . Hypertension Paternal Grandmother   . Heart disease Paternal Grandfather   . Hypertension Paternal Grandfather   . Eczema Brother   . Allergic rhinitis Neg Hx   . Angioedema Neg Hx   . Asthma Neg Hx   . Immunodeficiency Neg Hx   . Urticaria Neg Hx     Social History: Social History   Tobacco Use  . Smoking status: Never Smoker  . Smokeless tobacco: Never Used  Substance Use Topics  . Alcohol use: Yes    Comment: occ  . Drug use: No   Social History   Social History Narrative   Patient is single, has 0 children   Patient is right handed   Education level is grad school   Caffeine consumption is 1 cup daily   Two story home    Review of Systems:  CONSTITUTIONAL: No fevers, chills, night sweats, or weight loss.   EYES: No visual changes or eye pain ENT: No hearing changes.  No history of nose bleeds.    RESPIRATORY: No cough, wheezing and shortness of breath.   CARDIOVASCULAR: Negative for chest pain, and palpitations.   GI: Negative for abdominal discomfort, blood in stools or black stools.  No recent change in bowel habits.   GU:  No history of incontinence.   MUSCLOSKELETAL: No history of joint pain or swelling.  No myalgias.   SKIN: Negative for lesions, rash, and itching.   HEMATOLOGY/ONCOLOGY: Negative for prolonged bleeding, bruising easily, and swollen nodes.  No history of cancer.   ENDOCRINE: Negative for cold or heat intolerance, polydipsia or goiter.   PSYCH:  No depression or anxiety symptoms.   NEURO: As Above.   Vital Signs:  Ht 5\' 4"  (1.626 m)   Wt 265 lb (120.2 kg)   BMI 45.49 kg/m    General Medical Exam:  Well appearing, comfortable.  Nonlabored breathing.  No deformity or edema.  No rash.  Neurological Exam: MENTAL STATUS including orientation to time, place, person, recent and remote memory, attention span and concentration, language, and fund of knowledge is normal.  Speech is not dysarthric.  CRANIAL NERVES:  Normal conjugate, extra-ocular eye movements in all directions of gaze.  No ptosi.  Normal facial symmetry and movements.  Normal shoulder shrug and head rotation.  Tongue is midline.  MOTOR:  Antigravity in all extremities.  No abnormal movements.  No pronator drift.  SENSORY & REFLEXES:  Unable to assess  COORDINATION/GAIT: Normal finger to nose bilaterally.  Intact rapid alternating movements bilaterally.  Able to rise from a chair without using arms.  Gait narrow based and stable. Stressed gait intact.    IMPRESSION: Left face, arm, and leg paresthesias. Symptoms are intermittent but tend to always occur together.  No associated weakness.  Given her stereotyped hemisensory symptoms, recommend MRI/A head to evaluate for structural pathology (demyelinating disease) and vessel abnormalities, such as aneurysm. If imaging returns normal, then she will  need to have EDX of the left side and lab testing for vitamin deficiencies which can manifest with sensory changes.   PLAN/RECOMMENDATIONS:  MRI brain wo contrast MRA head   Follow Up Instructions:  I discussed  the assessment and treatment plan with the patient. The patient was provided an opportunity to ask questions and all were answered. The patient agreed with the plan and demonstrated an understanding of the instructions.   The patient was advised to call back or seek an in-person evaluation if the symptoms worsen or if the condition fails to improve as anticipated.  Further recommendations pending results.  Total Time spent:  45 min   Alda Berthold, DO

## 2019-06-12 ENCOUNTER — Other Ambulatory Visit: Payer: Self-pay

## 2019-06-12 ENCOUNTER — Ambulatory Visit (INDEPENDENT_AMBULATORY_CARE_PROVIDER_SITE_OTHER): Payer: 59 | Admitting: Allergy & Immunology

## 2019-06-12 ENCOUNTER — Encounter: Payer: Self-pay | Admitting: Allergy & Immunology

## 2019-06-12 VITALS — BP 130/78 | HR 74 | Temp 97.6°F | Resp 18

## 2019-06-12 DIAGNOSIS — J302 Other seasonal allergic rhinitis: Secondary | ICD-10-CM | POA: Diagnosis not present

## 2019-06-12 DIAGNOSIS — J3089 Other allergic rhinitis: Secondary | ICD-10-CM | POA: Diagnosis not present

## 2019-06-12 DIAGNOSIS — J4540 Moderate persistent asthma, uncomplicated: Secondary | ICD-10-CM | POA: Diagnosis not present

## 2019-06-12 DIAGNOSIS — T7800XD Anaphylactic reaction due to unspecified food, subsequent encounter: Secondary | ICD-10-CM

## 2019-06-12 NOTE — Progress Notes (Signed)
FOLLOW UP  Date of Service/Encounter:     Assessment:   Moderate persistent asthma without complication - not well controlled with increased symptoms during physical activity exertion  Adverse food reactions (peanuts, tree nuts, shrimp, tomato) - with low positives to peanut, tree nuts, tomato  Seasonal and perennial allergic rhinitis(grasses, weeds, trees, mold, roach, and dust mites)  Plan/Recommendations:   1. Moderate persistent asthma, uncomplicated - Lung testing looked stable today.  - Sample of Spiriva provided (one puff once daily) - This opens your lung using a different receptor than the albuterol.  - Let us know if this helps with your shortness of breath when going upstairs and whatnot. - Strongly consider adding on Xolair to help with both your breathing and your cross contamination episodes.  - IgE level was 243 in June 2020.  - Daily controller medication(s): Symbicort 160/4.5 two puffs twice daily with spacer - Rescue medications: ProAir 4 puffs every 4-6 hours as needed - Asthma control goals:  * Full participation in all desired activities (may need albuterol before activity) * Albuterol use two time or less a week on average (not counting use with activity) * Cough interfering with sleep two time or less a month * Oral steroids no more than once a year * No hospitalizations  2. Perennial allergic rhinitis (grasses, weeds, trees, mold, roach, and dust mites) - Continue with Nasacort OTC 1-2 sprays per nostril daily.  - Continue with Xyzal 5mg  daily (you can use one twice daily if needed).  - CPT codes provided today. - Consider allergy shots if we can get them better covered.   3. Adverse food reaction (peanuts, tree nuts, shrimp, tomato) - Continue to avoid peanuts, tree nuts, shrimp, and tomato. - We will send in a prescription for AuviQ (epinephrine autoinjector).  - Anaphylaxis management plan updated.  - Consider adding on Xolair to your  medication regimen to decrease the episodes of the cross contamination.  4. Return in about 6 months (around 12/10/2019). This can be an in-person, a virtual Webex or a telephone follow up visit.   Subjective:   Jaclyn Haynes is a 48 y.o. female presenting today for follow up of  Chief Complaint  Patient presents with  . Follow-up    Jaclyn Haynes has a history of the following: Patient Active Problem List   Diagnosis Date Noted  . Angioedema of lips 05/21/2016  . Allergic reaction 05/21/2016  . Palpitation 08/04/2015  . Migraine without aura and without status migrainosus, not intractable 10/27/2014  . Dizziness and giddiness 10/27/2013  . Disturbance of skin sensation 10/27/2013    History obtained from: chart review and patient.  [name] is a 48 y.o. female presenting for a follow up visit.  She was last seen in June 2020 as a telephone visit.  At that time, she had not been seen in over a year.  Her asthma seems well controlled with Symbicort 160/4.5 mcg 2 puffs twice daily with albuterol as needed during flares.  For her allergic rhinitis, we continue Nasacort as well as Xyzal.  She has a history of multiple adverse reactions including peanuts, tree nuts, shrimp, and tomato.  We refilled her epinephrine autoinjector.  We also discussed adding on Xolair to try to decrease the episodes of cross-contamination.  Since last visit, she has mostly done well.  They have resumed some in person work, but this is around 3 days every 2 weeks according to what she tells me today.  Asthma/Respiratory Symptom  History: She remains on Symbicort 2 puffs in the morning and 2 puffs at night.  She has been more compliant with this.  She has not needed her rescue inhaler much at all.  She has not needed prednisone or emergency room visits for her symptoms. She feels that her symptoms are well controlled, but then she shares that she has problems even walking up the flight of stairs to her house.  She literally becomes out of breath. Of note, she has no history of smoking or intrinsic lung disease.   Allergic Rhinitis Symptom History: She remains on the Nasacort and the Xyzal.  She does take Xyzal twice daily more often than not.  She was on shots in the past, but apparently these were $50 per visit for her shot, which is clearly unaffordable for her.  She is going to check with her insurance company again as she thinks this would provide a lot of relief.  There was discussion that they were going to join the state [place], but this has not happened yet and probably will not until 2022.  Right now she has [employer].  Food Allergy Symptom History: She continues to avoid peanuts, tree nuts, shellfish, and tomato.  She has had no accidental exposures since last visit.  Most of her cross-contamination reactions occurred when she has not work, so not being at work is actually helped.  She is open to adding Xolair and would like more information on that today.  She did start having numbness around 3 months ago.  They are going to get a brain MRI to see if there is something in neurologic going on with her.  Otherwise, there have been no changes to her past medical history, surgical history, family history, or social history.    Review of Systems  Constitutional: Negative.  Negative for chills, fever, malaise/fatigue and weight loss.  HENT: Negative.  Negative for congestion, ear discharge, ear pain and sore throat.   Eyes: Negative for pain, discharge and redness.  Respiratory: Negative for cough, sputum production, shortness of breath and wheezing.   Cardiovascular: Negative.  Negative for chest pain and palpitations.  Gastrointestinal: Negative for abdominal pain, constipation, diarrhea, heartburn, nausea and vomiting.  Skin: Negative.  Negative for itching and rash.  Neurological: Negative for dizziness and headaches.  Endo/Heme/Allergies: Negative for  environmental allergies. Does not bruise/bleed easily.       Objective:   Blood pressure 130/78, pulse 74, temperature 97.6 F (36.4 C), temperature source Temporal, resp. rate 18, SpO2 97 %. There is no height or weight on file to calculate BMI.   Physical Exam:  Physical Exam  Constitutional: She appears well-developed.  Very pleasant female. Talkative female.   HENT:  Head: Normocephalic and atraumatic.  Right Ear: Tympanic membrane, external ear and ear canal normal.  Left Ear: Tympanic membrane, external ear and ear canal normal.  Nose: Mucosal edema and rhinorrhea present. No nasal deformity or septal deviation. No epistaxis. Right sinus exhibits no maxillary sinus tenderness and no frontal sinus tenderness. Left sinus exhibits no maxillary sinus tenderness and no frontal sinus tenderness.  Mouth/Throat: Uvula is midline and oropharynx is clear and moist. Mucous membranes are not pale and not dry.  Tonsils 2+ without discharge. She does have some cobblestoning in the posterior oropharynx.   Eyes: Pupils are equal, round, and reactive to light. Conjunctivae and EOM are normal. Right eye exhibits no chemosis and no discharge. Left eye exhibits no chemosis and  no discharge. Right conjunctiva is not injected. Left conjunctiva is not injected.  Cardiovascular: Normal rate, regular rhythm and normal heart sounds.  Respiratory: Effort normal and breath sounds normal. No accessory muscle usage. No tachypnea. No respiratory distress. She has no wheezes. She has no rhonchi. She has no rales. She exhibits no tenderness.  Moving air well in all lung fields. No increased work of breathing.   Lymphadenopathy:    She has no cervical adenopathy.  Neurological: She is alert.  Skin: No abrasion, no petechiae and no rash noted. Rash is not papular, not vesicular and not urticarial. No erythema. No pallor.  No eczematous or urticarial lesions noted. No excorations.   Psychiatric: She has a normal  mood and affect.     Diagnostic studies:    Spirometry: results abnormal (FEV1: 1.69/69%, FVC: 2.17/71%, FEV1/FVC: 78%).    Spirometry consistent with possible restrictive disease.   Allergy Studies: none        Salvatore Marvel, MD  [facility]

## 2019-06-12 NOTE — Patient Instructions (Addendum)
1. Moderate persistent asthma, uncomplicated - Lung testing looked stable today.  - Sample of Spiriva provided (one puff once daily) - This opens your lung using a different receptor than the albuterol.  - Let us know if this helps with your shortness of breath when going upstairs and whatnot. - Strongly consider adding on Xolair to help with both your breathing and your cross contamination episodes.  - Daily controller medication(s): Symbicort 160/4.5 two puffs twice daily with spacer - Rescue medications: ProAir 4 puffs every 4-6 hours as needed - Asthma control goals:  * Full participation in all desired activities (may need albuterol before activity) * Albuterol use two time or less a week on average (not counting use with activity) * Cough interfering with sleep two time or less a month * Oral steroids no more than once a year * No hospitalizations  2. Perennial allergic rhinitis (grasses, weeds, trees, mold, roach, and dust mites) - Continue with Nasacort OTC 1-2 sprays per nostril daily.  - Continue with Xyzal 5mg  daily (you can use one twice daily if needed).  - CPT codes provided today. - Consider allergy shots if we can get them better covered.   3. Adverse food reaction (peanuts, tree nuts, shrimp, tomato) - Continue to avoid peanuts, tree nuts, shrimp, and tomato. - We will send in a prescription for AuviQ (epinephrine autoinjector).  - Anaphylaxis management plan updated.  - Consider adding on Xolair to your medication regimen to decrease the episodes of the cross contamination.  4. Return in about 6 months (around 12/10/2019). This can be an in-person, a virtual Webex or a telephone follow up visit.   Please inform us of any Emergency Department visits, hospitalizations, or changes in symptoms. Call us before going to the ED for breathing or allergy symptoms since we might be able to fit you in for a sick visit. Feel free to contact us anytime with any questions, problems,  or concerns.  It was a pleasure to see you again today!  Websites that have reliable patient information: 1. American Academy of Asthma, Allergy, and Immunology: www.aaaai.org 2. Food Allergy Research and Education (FARE): foodallergy.org 3. Mothers of Asthmatics: http://www.asthmacommunitynetwork.org 4. American College of Allergy, Asthma, and Immunology: www.acaai.org  "Like" [place] on Facebook and Instagram for our latest updates!      Make sure you are registered to vote! If you have moved or changed any of your contact information, you will need to get this updated before voting!  In some cases, you MAY be able to register to vote online: [email or web address]    Voter ID laws are NOT going into effect for the General Election in November 2020! DO NOT let this stop you from exercising your right to vote!   Absentee voting is the SAFEST way to vote during the coronavirus pandemic!   Download and print an absentee ballot request form at [email or web address] or you can scan the QR code below with your smart phone:      More information on absentee ballots can be found here: [email or web address]  [place] VOTING SITES have been established! You can register to vote and cast your vote on the same day at these locations. See this site for more information on what you need to register to vote: [email or web address]

## 2019-06-13 ENCOUNTER — Encounter: Payer: Self-pay | Admitting: Allergy & Immunology

## 2019-06-13 MED ORDER — BUDESONIDE-FORMOTEROL FUMARATE 160-4.5 MCG/ACT IN AERO: 2.0000 | INHALATION_SPRAY | Freq: Two times a day (BID) | RESPIRATORY_TRACT | 5 refills | Status: DC

## 2019-06-13 MED ORDER — ALBUTEROL SULFATE HFA 108 (90 BASE) MCG/ACT IN AERS: 2.0000 | INHALATION_SPRAY | Freq: Four times a day (QID) | RESPIRATORY_TRACT | 1 refills | Status: DC | PRN

## 2019-06-13 MED ORDER — LEVOCETIRIZINE DIHYDROCHLORIDE 5 MG PO TABS: 5.0000 mg | ORAL_TABLET | Freq: Every evening | ORAL | 5 refills | Status: DC

## 2019-06-13 MED ORDER — EPINEPHRINE 0.3 MG/0.3ML IJ SOAJ: 0.3000 mg | Freq: Once | INTRAMUSCULAR | 2 refills | Status: AC

## 2019-06-16 ENCOUNTER — Telehealth: Payer: Self-pay | Admitting: *Deleted

## 2019-06-16 NOTE — Telephone Encounter (Signed)
-----   Message from Valentina Shaggy, MD sent at 06/13/2019  3:27 PM EDT ----- Can you run some numbers of Xolair for this patient? IgE was done in June 2020.

## 2019-06-16 NOTE — Telephone Encounter (Signed)
Called and discussed with patient Jaclyn Haynes dosing and affordability.  Patient will be getting 300mg  every 14 days. Explained the approval, copay card and submit and patient wants to start therapy.  WIll submit to [facility]

## 2019-07-01 ENCOUNTER — Ambulatory Visit
Admission: RE | Admit: 2019-07-01 | Discharge: 2019-07-01 | Disposition: A | Discharge status: Home / Self Care | Payer: 59 | Source: Ambulatory Visit | Attending: Neurology | Admitting: Neurology

## 2019-07-01 ENCOUNTER — Other Ambulatory Visit: Payer: Self-pay | Admitting: Neurology

## 2019-07-01 ENCOUNTER — Other Ambulatory Visit: Payer: Self-pay

## 2019-07-01 DIAGNOSIS — R29818 Other symptoms and signs involving the nervous system: Secondary | ICD-10-CM

## 2019-07-01 DIAGNOSIS — R200 Anesthesia of skin: Secondary | ICD-10-CM

## 2019-07-01 DIAGNOSIS — R202 Paresthesia of skin: Secondary | ICD-10-CM

## 2019-07-04 ENCOUNTER — Telehealth: Payer: Self-pay | Admitting: Neurology

## 2019-07-04 NOTE — Telephone Encounter (Signed)
Called and left patient a message to call back to schedule: follow-up in 2-4 weeks with Dr. Posey Pronto.

## 2019-07-04 NOTE — Telephone Encounter (Signed)
07/28/19 at 3:30 PM

## 2019-07-24 ENCOUNTER — Telehealth: Payer: Self-pay | Admitting: *Deleted

## 2019-07-24 ENCOUNTER — Other Ambulatory Visit: Payer: Self-pay

## 2019-07-24 ENCOUNTER — Ambulatory Visit (INDEPENDENT_AMBULATORY_CARE_PROVIDER_SITE_OTHER): Payer: 59 | Admitting: *Deleted

## 2019-07-24 DIAGNOSIS — J4540 Moderate persistent asthma, uncomplicated: Secondary | ICD-10-CM | POA: Diagnosis not present

## 2019-07-24 MED ORDER — OMALIZUMAB 150 MG ~~LOC~~ SOLR
300.0000 mg | SUBCUTANEOUS | Status: DC
  Administered 2019-07-24 – 2019-09-10 (×4): 300 mg via SUBCUTANEOUS

## 2019-07-24 NOTE — Telephone Encounter (Signed)
I have two other patients on that drug and biologics for asthma. I think it will be ok. They're each so targeted that I don't think it will interfere.  Jaclyn Haynes

## 2019-07-24 NOTE — Progress Notes (Signed)
Immunotherapy   Patient Details  Name: BRILEA CRIVELLI MRN: [id number] Date of Birth: [birth year]  07/24/2019  Morrie Sheldon started injections for  Xolair 300mg   Frequency: Every 2 Weeks Epi-Pen: Yes Consent signed and patient instructions given. Ileanna started Xolair today and received 300mg  dose. Patient waited 30 minutes and did not experience any issues.    Ashleigh Fernandez-Vernon 07/24/2019, 9:15 AM

## 2019-07-24 NOTE — Telephone Encounter (Signed)
Patient is going to be starting Emgality for migraines and wanted to make sure that it won't interfere with her Xolair. Please advise.

## 2019-07-24 NOTE — Telephone Encounter (Signed)
Called patient and informed. Patient verbalized understanding.  

## 2019-07-25 ENCOUNTER — Ambulatory Visit: Payer: Self-pay

## 2019-07-28 ENCOUNTER — Other Ambulatory Visit: Payer: Self-pay

## 2019-07-28 ENCOUNTER — Encounter: Payer: Self-pay | Admitting: Neurology

## 2019-07-28 ENCOUNTER — Ambulatory Visit: Payer: 59 | Admitting: Neurology

## 2019-07-28 VITALS — BP 146/78 | HR 78 | Ht 64.0 in | Wt 268.0 lb

## 2019-07-28 DIAGNOSIS — R202 Paresthesia of skin: Secondary | ICD-10-CM | POA: Diagnosis not present

## 2019-07-28 DIAGNOSIS — G43709 Chronic migraine without aura, not intractable, without status migrainosus: Secondary | ICD-10-CM | POA: Diagnosis not present

## 2019-07-28 DIAGNOSIS — G43009 Migraine without aura, not intractable, without status migrainosus: Secondary | ICD-10-CM | POA: Diagnosis not present

## 2019-07-28 DIAGNOSIS — IMO0002 Reserved for concepts with insufficient information to code with codable children: Secondary | ICD-10-CM

## 2019-07-28 MED ORDER — AIMOVIG 70 MG/ML ~~LOC~~ SOAJ: 70.0000 mg | SUBCUTANEOUS | 11 refills | Status: DC

## 2019-07-28 NOTE — Progress Notes (Signed)
Follow-up Visit   Date: 07/28/19   AVIANCA Haynes MRN: [id number] DOB: [birth year]   Interim History: Jaclyn Haynes is a 48 y.o. right-handed female with asthma, GERD, hypertension, and migraine returning to the clinic for follow-up of left side numbness.  The patient was accompanied to the clinic by self.   History of present illness: Starting around the end of July 2020, she began having numbness over left side of the cheek and lips. Over the next week, she began having tingling over the left arm from the elbow into the hand.  By August, she was having numbness involving the left cheek, forearm/hand, and left thigh, lower leg, and foot.  Symptoms would occur 3-5 times per week, lasting anywhere from 30-min to several hours.  Symptoms are triggered by sitting and bending forward.  Her facial numbness has been triggered by resting her head against her headboard.  On two occasions, she had a headache that followed.  There is no associated weakness, change in speech/swallowing, or falls. She has not had similar symptoms in the past.   She takes Ajovy for migraines which has reduced her frequency of headaches to twice per month.  Headaches typically started on the right side of her headache, worse with bright lights and loud. noises.    She works as a Hydrographic surveyor.   UPDATE 07/28/2019:  She is here for follow-up visit.  She continues to have numbness over the left lips and cheek lasting 2 hours, but no longer in the left arm or leg in the past 2 weeks. MRI/A head was normal.  She does not have similar symptoms of the right side of the body.    She has had migraines for the past 10 years, with right sided throbbing pain.  She gets severe migraine about 1-2 times per week.  She was previously seeing Dr. Melton Alar. She has mild headache about 3 times per week.  She has photophobia and nausea.  She has previously been on topirmate, Emerge, Maxalt.  She does  not remember the name of other headaches medications.  She was on Ajovy, however, this was discontinued as it was not on her preferred drug list.    Medications:  Current Outpatient Medications on File Prior to Visit  Medication Sig Dispense Refill  . acetaminophen (TYLENOL) 650 MG CR tablet Take 1,300 mg by mouth every 8 (eight) hours as needed for pain.    Marland Kitchen albuterol (VENTOLIN HFA) 108 (90 Base) MCG/ACT inhaler Inhale 2 puffs into the lungs every 6 (six) hours as needed for wheezing. 18 g 1  . ASHLYNA 0.15-0.03 &0.01 MG tablet Take 0.15 mg by mouth daily.    . budesonide-formoterol (SYMBICORT) 160-4.5 MCG/ACT inhaler Inhale 2 puffs into the lungs 2 (two) times daily. 1 Inhaler 5  . Calcium Carbonate (CALCIUM 600 PO) Take 1 tablet by mouth daily.    . carvedilol (COREG CR) 40 MG 24 hr capsule Take 40 mg by mouth daily.    . Clindamycin-Benzoyl Per, Refr, gel Apply to face every morning    . cyclobenzaprine (FLEXERIL) 10 MG tablet cyclobenzaprine 10 mg tablet    . dimenhyDRINATE (DRAMAMINE PO) Take by mouth.    . diphenhydrAMINE (BENADRYL) 25 mg capsule Take 1 capsule (25 mg total) by mouth every 6 (six) hours. 30 capsule 0  . EPINEPHrine (AUVI-Q) 0.3 mg/0.3 mL IJ SOAJ injection Inject 0.3 mLs (0.3 mg total) into the muscle as needed for anaphylaxis. 2  Device 1  . famotidine (PEPCID) 10 MG tablet Take 10 mg by mouth 2 (two) times daily.    Marland Kitchen glucosamine-chondroitin 500-400 MG tablet Take by mouth.    Marland Kitchen ibuprofen (ADVIL) 200 MG tablet Take 3 tablets (600 mg total) by mouth every 6 (six) hours as needed. 30 tablet 0  . levocetirizine (XYZAL) 5 MG tablet Take 5 mg by mouth every evening.     Marland Kitchen levocetirizine (XYZAL) 5 MG tablet Take 1 tablet (5 mg total) by mouth every evening. 30 tablet 5  . Lysine 500 MG CAPS Take 500 mg by mouth.    . montelukast (SINGULAIR) 10 MG tablet TK 1 T PO QD IN THE EVENING  5  . omeprazole (PRILOSEC) 10 MG capsule Take 10 mg by mouth daily.    Marland Kitchen omeprazole  (PRILOSEC) 40 MG capsule TK ONE C PO  BID FOR GERD    . Probiotic Product (PROBIOTIC DAILY) CAPS Hold until Recommended to take by allergy specialist. (Patient taking differently: Take 1 capsule by mouth daily. )    . spironolactone (ALDACTONE) 25 MG tablet Take one tablet by mouth daily with food    . tretinoin (RETIN-A) 0.025 % cream Apply a pea-sized amount to entire face every other night to nightly.    . valACYclovir (VALTREX) 500 MG tablet Take 1 tablet (500 mg total) by mouth daily. Hold until Recommended to take by allergy specialist.    . vitamin B-12 (CYANOCOBALAMIN) 1000 MCG tablet Take 1,000 mcg by mouth daily.     Current Facility-Administered Medications on File Prior to Visit  Medication Dose Route Frequency Provider Last Rate Last Dose  . omalizumab Arvid Right) injection 300 mg  300 mg Subcutaneous Q14 Days Valentina Shaggy, MD   300 mg at 07/24/19 [id number]    Allergies:  Allergies  Allergen Reactions  . Peanut Oil Anaphylaxis    Other reaction(s): Angioedema  . Peanut-Containing Drug Products Anaphylaxis and Hives  . Pollen Extract   . Shellfish Allergy   . Tomato Hives    Only raw tomato causes reaction  . Bee Venom Hives, Swelling and Rash    Swelling , more than localized  . Other Other (See Comments)    Allergy Testing Other reaction(s): Other (See Comments) Acidic fruits and vegetables, rash     Review of Systems:  CONSTITUTIONAL: No fevers, chills, night sweats, or weight loss.  EYES: No visual changes or eye pain ENT: No hearing changes.  No history of nose bleeds.   RESPIRATORY: No cough, wheezing and shortness of breath.   CARDIOVASCULAR: Negative for chest pain, and palpitations.   GI: Negative for abdominal discomfort, blood in stools or black stools.  No recent change in bowel habits.   GU:  No history of incontinence.   MUSCLOSKELETAL: No history of joint pain or swelling.  No myalgias.   SKIN: Negative for lesions, rash, and itching.   ENDOCRINE:  Negative for cold or heat intolerance, polydipsia or goiter.   PSYCH:  No depression or anxiety symptoms.   NEURO: As Above.   Vital Signs:  BP (!) 146/78   Pulse 78   Ht 5\' 4"  (1.626 m)   Wt 268 lb (121.6 kg)   SpO2 (!) 78%   BMI 46.00 kg/m    General Medical Exam:   General:  Well appearing, comfortable  Eyes/ENT: see cranial nerve examination.   Neck:  No carotid bruits. Respiratory:  Clear to auscultation, good air entry bilaterally.   Cardiac:  Regular  rate and rhythm, no murmur.   Ext:  No edema   Neurological Exam: MENTAL STATUS including orientation to time, place, person, recent and remote memory, attention span and concentration, language, and fund of knowledge is normal.  Speech is not dysarthric.  CRANIAL NERVES:  No visual field defects.  Pupils equal round and reactive to light.  Normal conjugate, extra-ocular eye movements in all directions of gaze.  No ptosis.  Facial sensation is intact. Face is symmetric. Palate elevates symmetrically.  Tongue is midline.  MOTOR:  Motor strength is 5/5 in all extremities.  No atrophy, fasciculations or abnormal movements.  No pronator drift.  Tone is normal.    MSRs:  Reflexes are 2+/4 throughout.  SENSORY:  Intact to vibration throughout.  COORDINATION/GAIT:  Normal finger-to- nose-finger.  Intact rapid alternating movements bilaterally.  Gait narrow based and stable.   Data: MRI/A brain 07/02/2019:  Normal  IMPRESSION/PLAN: Left facial paresthesias, unclear etiology.  MRI/A head and exam is normal.  - Check vitamin B12 and folate  - Continue to monitor  Chronic migraine  - Previously tried:  Topiramate, Emerge, Maxalt, Ajovy  - Request PCP's notes as to which other preventative medications have been tried  - Start PA for Aimovig 70mg  every 28 days  Return to clinic in 4 months   Thank you for allowing me to participate in patient's care.  If I can answer any additional questions, I would be pleased to do so.     Sincerely,    Donika K. Posey Pronto, DO

## 2019-07-28 NOTE — Patient Instructions (Addendum)
Check labs  We will start prior authorization for Aimovig  Return to clinic in 4 months

## 2019-07-30 ENCOUNTER — Telehealth: Payer: Self-pay | Admitting: *Deleted

## 2019-07-30 NOTE — Telephone Encounter (Signed)
Patient called stating that after she received her Xolair injection on 07/24/19 around 5:00 her right arm was throbbing, burning, and itching and had hives around the injection site. She stated at 9:00 her left arm began to itching and throb and have hives. She stated that she took benadryl and did hot compresses on her arms that night. She stated this went on for Thursday, Friday. She stated on Saturday just her shoulders kept itching and on Sunday she was having tingling in her arms. She has not had any further issues since Sunday. Please advise the next step the patient should take regarding her Xolair injections.

## 2019-07-31 ENCOUNTER — Telehealth: Payer: Self-pay | Admitting: Neurology

## 2019-07-31 NOTE — Telephone Encounter (Signed)
That is very odd, but let's try prophylactically treating the symptoms before her next dose. Let's try taking a double dose of antihistamines before the next injection both on the day of the injection and the day after the injection. We will see how she does next time.   Unfortunately, it would be difficult to get an anti-IL5 medication approved without her having an elevated absolute eosinophil level. I do not believe she has eczema, so getting Dupixent approved would be difficult as well.  Salvatore Marvel, MD Allergy and [place] [facility] [place]

## 2019-07-31 NOTE — Telephone Encounter (Signed)
Left message regarding patient to contact the office to discuss options on treating her symptoms

## 2019-07-31 NOTE — Telephone Encounter (Signed)
ROI fax to [facility] for records.

## 2019-08-01 NOTE — Telephone Encounter (Signed)
Patient called back and Dr. Gillermina Hu plan was relayed to the patient in regards to her next injection. Patient verbalized understanding.

## 2019-08-05 NOTE — Progress Notes (Signed)
Addendum  Notes received from PCPs office summarizing headache management as per Dr. Melton Alar.  She has previously been on Ajovy, Toprol-XL, meloxicam, Amerge, venlafaxine, Toprol Permax, diltiazem, fluoxetine, Robaxin, escitalopram, and Zomig.

## 2019-08-06 NOTE — Progress Notes (Signed)
Her Rx was covered without PA. I just called patient to confirm.FYI

## 2019-08-06 NOTE — Progress Notes (Signed)
Checking with susan

## 2019-08-06 NOTE — Progress Notes (Signed)
Medication was picked up.

## 2019-08-07 ENCOUNTER — Ambulatory Visit: Payer: Self-pay

## 2019-08-13 ENCOUNTER — Other Ambulatory Visit: Payer: Self-pay

## 2019-08-13 ENCOUNTER — Ambulatory Visit (INDEPENDENT_AMBULATORY_CARE_PROVIDER_SITE_OTHER): Payer: 59

## 2019-08-13 DIAGNOSIS — J4540 Moderate persistent asthma, uncomplicated: Secondary | ICD-10-CM | POA: Diagnosis not present

## 2019-08-27 ENCOUNTER — Ambulatory Visit (INDEPENDENT_AMBULATORY_CARE_PROVIDER_SITE_OTHER): Payer: 59

## 2019-08-27 ENCOUNTER — Other Ambulatory Visit: Payer: Self-pay

## 2019-08-27 DIAGNOSIS — J4540 Moderate persistent asthma, uncomplicated: Secondary | ICD-10-CM

## 2019-09-10 ENCOUNTER — Other Ambulatory Visit: Payer: Self-pay

## 2019-09-10 ENCOUNTER — Ambulatory Visit (INDEPENDENT_AMBULATORY_CARE_PROVIDER_SITE_OTHER): Payer: 59

## 2019-09-10 DIAGNOSIS — J4540 Moderate persistent asthma, uncomplicated: Secondary | ICD-10-CM | POA: Diagnosis not present

## 2019-09-11 ENCOUNTER — Telehealth: Payer: Self-pay

## 2019-09-11 NOTE — Telephone Encounter (Signed)
Hopefully her airway was not involved? Can she send [place] pictures of the injection site?   Salvatore Marvel, MD Allergy and [place] [facility] [place]

## 2019-09-11 NOTE — Telephone Encounter (Signed)
Patient received Xolair injection. She developed redness at the injection site along with a long 1 in wide hive. Itching/painful/tingling. No heat that she can recall.

## 2019-09-11 NOTE — Telephone Encounter (Signed)
Called and spoke with Jaclyn Haynes and she stated that she was have sinus issues that day so it was hard for her to breath through her nose at that time and she was having a hard time catching her breath. But she did state that she did not have any issues with throat closure or anything like that. She stated she will work on trying to upload the most recent pictures so that she can send them via [place].

## 2019-09-12 ENCOUNTER — Other Ambulatory Visit: Payer: Self-pay

## 2019-09-12 ENCOUNTER — Encounter: Payer: Self-pay | Admitting: Allergy & Immunology

## 2019-09-12 ENCOUNTER — Ambulatory Visit (INDEPENDENT_AMBULATORY_CARE_PROVIDER_SITE_OTHER): Payer: 59 | Admitting: Allergy & Immunology

## 2019-09-12 DIAGNOSIS — J4540 Moderate persistent asthma, uncomplicated: Secondary | ICD-10-CM

## 2019-09-12 DIAGNOSIS — J3089 Other allergic rhinitis: Secondary | ICD-10-CM

## 2019-09-12 DIAGNOSIS — T7800XD Anaphylactic reaction due to unspecified food, subsequent encounter: Secondary | ICD-10-CM

## 2019-09-12 DIAGNOSIS — T50905A Adverse effect of unspecified drugs, medicaments and biological substances, initial encounter: Secondary | ICD-10-CM | POA: Diagnosis not present

## 2019-09-12 DIAGNOSIS — J302 Other seasonal allergic rhinitis: Secondary | ICD-10-CM

## 2019-09-12 MED ORDER — LEVOCETIRIZINE DIHYDROCHLORIDE 5 MG PO TABS: 5.0000 mg | ORAL_TABLET | Freq: Two times a day (BID) | ORAL | 3 refills | Status: DC

## 2019-09-12 MED ORDER — PREDNISONE 10 MG PO TABS: 20.0000 mg | ORAL_TABLET | Freq: Two times a day (BID) | ORAL | 0 refills | Status: AC

## 2019-09-12 NOTE — Progress Notes (Signed)
RE: Jaclyn Haynes MRN: [id number] DOB: Feb 06, 1971 Date of Telemedicine Visit: 09/12/2019  Referring provider: Lennie Odor, PA-C Primary care provider: Lennie Odor, PA-C  Chief Complaint: Allergic Reaction (itching caused by Xolair happening 5 pm every evening )   Telemedicine Follow Up Visit via Telephone: I connected with Jaclyn Haynes for a follow up on 09/12/19 by telephone and verified that I am speaking with the correct person using two identifiers.   I discussed the limitations, risks, security and privacy concerns of performing an evaluation and management service by telephone and the availability of in person appointments. I also discussed with the patient that there may be a patient responsible charge related to this service. The patient expressed understanding and agreed to proceed.  Patient is at work.  Provider is at the office.  Visit start time: 3:11 PM Visit end time: 3:30 PM Insurance consent/check in by: [facility] consent and medical assistant/nurse: Jaclyn Haynes  History of Present Illness:  She is a 48 y.o. 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 Her previous allergy office visit was in September 2020 with myself. At that visit, her lung testing looked stable. We did add on Spiriva to her regimen and we discussed starting Xolair for better asthma control. She was reporting shortness of breath with activities of daily living, which is why we decided to be more aggressive with her asthma control. She did start the Xolair at that visit. For her rhinitis, we continued Nasacort, Xyzal, and encouraged starting allergy shots, but these have been too expensive with her insurance. For her anaphylaxis to foods, we recommended continued avoidance of peanuts, tree nuts, shrimp, and tomato. We sent in a refill for AuviQ and felt that the addition of the Xolair might be modulate her allergy reactions.   Since the last visit,  she has mostly done well. However, she tells me that at each Xolair injection, she developed itching around the site of injection. She continued with the injections and after this last injection earlier this week, she developed intense itching and swelling. She is interested in discussing medication changes.   She has been having burning and itching and pain with the Xolair injections. She had one episode where she did nto have a reaction. Her skin did not have swelling only on one injection. She has taken pictures every time to prove this. She has a picture log. She does not feel that the addition of the Xolair ever really helped her asthma, but she does note that she has been able to manage her stairs a bit better than before. She is open to other biologics. We spent some time discussing Dupixent today on the phone. Regarding her other asthma medications, she does feel that the addition of the Spiriva helped her quite a bit. She remains on the Symbicort two puffs BID. Her albuterol use has not been excessive.   Otherwise, there have been no changes to her past medical history, surgical history, family history, or social history.  Assessment and Plan:  Jaclyn Haynes is a 48 y.o. female with:  Moderate persistent asthma without complication - with adverse reaction to Xolair  Adverse food reactions (peanuts, tree nuts, shrimp, tomato)- with low positives to peanut, tree nuts, tomato  Seasonal and perennial allergic rhinitis(grasses, weeds, trees, mold, roach, and dust mites)   After speaking with Jaclyn Haynes, I think that the Xolair did do more to help with her asthma control than she realized. I think  she was anticipating a magic bullet, and I explained to her that these medications do take some time to work. On the bright side, she feels that the addition of the Spiriva helped her, therefore we are going to try to change her to Jaclyn Haynes two puffs BID. We will have a sample for her at the front desk on  Monday to pick up. Hopefully once January 2021 rolls around, we will have this on her formulary and the copay card will work. We are also going to stop the Xolair and start Dupixent every two weeks. I think this will provide some help against her asthma and her food reactions and rhinitis. We discussed the side effect profile and she is willing to proceed with this.    Diagnostics: None.  Medication List:  Current Outpatient Medications  Medication Sig Dispense Refill  . acetaminophen (TYLENOL) 650 MG CR tablet Take 1,300 mg by mouth every 8 (eight) hours as needed for pain.    Marland Kitchen albuterol (VENTOLIN HFA) 108 (90 Base) MCG/ACT inhaler Inhale 2 puffs into the lungs every 6 (six) hours as needed for wheezing. 18 g 1  . ASHLYNA 0.15-0.03 &0.01 MG tablet Take 0.15 mg by mouth daily.    . budesonide-formoterol (SYMBICORT) 160-4.5 MCG/ACT inhaler Inhale 2 puffs into the lungs 2 (two) times daily. 1 Inhaler 5  . Calcium Carbonate (CALCIUM 600 PO) Take 1 tablet by mouth daily.    . carvedilol (COREG CR) 40 MG 24 hr capsule Take 40 mg by mouth daily.    . Clindamycin-Benzoyl Per, Refr, gel Apply to face every morning    . cyclobenzaprine (FLEXERIL) 10 MG tablet cyclobenzaprine 10 mg tablet    . dimenhyDRINATE (DRAMAMINE PO) Take by mouth.    . diphenhydrAMINE (BENADRYL) 25 mg capsule Take 1 capsule (25 mg total) by mouth every 6 (six) hours. 30 capsule 0  . EPINEPHrine (AUVI-Q) 0.3 mg/0.3 mL IJ SOAJ injection Inject 0.3 mLs (0.3 mg total) into the muscle as needed for anaphylaxis. 2 Device 1  . Erenumab-aooe (AIMOVIG) 70 MG/ML SOAJ Inject 70 mg into the skin every 28 (twenty-eight) days. 1 pen 11  . famotidine (PEPCID) 10 MG tablet Take 10 mg by mouth 2 (two) times daily.    Marland Kitchen glucosamine-chondroitin 500-400 MG tablet Take by mouth.    Marland Kitchen ibuprofen (ADVIL) 200 MG tablet Take 3 tablets (600 mg total) by mouth every 6 (six) hours as needed. 30 tablet 0  . levocetirizine (XYZAL) 5 MG tablet Take 5 mg by  mouth every evening.     Marland Kitchen levocetirizine (XYZAL) 5 MG tablet Take 1 tablet (5 mg total) by mouth every evening. 30 tablet 5  . Lysine 500 MG CAPS Take 500 mg by mouth.    . montelukast (SINGULAIR) 10 MG tablet TK 1 T PO QD IN THE EVENING  5  . omeprazole (PRILOSEC) 10 MG capsule Take 10 mg by mouth daily.    Marland Kitchen omeprazole (PRILOSEC) 40 MG capsule TK ONE C PO  BID FOR GERD    . predniSONE (DELTASONE) 10 MG tablet Take 2 tablets (20 mg total) by mouth 2 (two) times daily for 5 days. 20 tablet 0  . Probiotic Product (PROBIOTIC DAILY) CAPS Hold until Recommended to take by allergy specialist. (Patient taking differently: Take 1 capsule by mouth daily. )    . spironolactone (ALDACTONE) 25 MG tablet Take one tablet by mouth daily with food    . tretinoin (RETIN-A) 0.025 % cream Apply a pea-sized amount  to entire face every other night to nightly.    . valACYclovir (VALTREX) 500 MG tablet Take 1 tablet (500 mg total) by mouth daily. Hold until Recommended to take by allergy specialist.    . vitamin B-12 (CYANOCOBALAMIN) 1000 MCG tablet Take 1,000 mcg by mouth daily.     Current Facility-Administered Medications  Medication Dose Route Frequency Provider Last Rate Last Admin  . omalizumab Arvid Right) injection 300 mg  300 mg Subcutaneous Q14 Days Valentina Shaggy, MD   300 mg at 09/10/19 0932   Allergies: Allergies  Allergen Reactions  . Peanut Oil Anaphylaxis    Other reaction(s): Angioedema  . Peanut-Containing Drug Products Anaphylaxis and Hives  . Pollen Extract   . Shellfish Allergy   . Tomato Hives    Only raw tomato causes reaction  . Bee Venom Hives, Swelling and Rash    Swelling , more than localized  . Other Other (See Comments)    Allergy Testing Other reaction(s): Other (See Comments) Acidic fruits and vegetables, rash    I reviewed her past medical history, social history, family history, and environmental history and no significant changes have been reported from previous  visits.  Review of Systems  Constitutional: Negative for activity change and appetite change.  HENT: Negative for congestion, postnasal drip, rhinorrhea, sinus pressure and sore throat.   Eyes: Negative for pain, discharge, redness and itching.  Respiratory: Negative for shortness of breath, wheezing and stridor.   Gastrointestinal: Negative for diarrhea, nausea and vomiting.  Musculoskeletal: Negative for arthralgias, joint swelling and myalgias.  Skin: Positive for rash.       Positive for pruritis.   Allergic/Immunologic: Negative for environmental allergies and food allergies.    Objective:  Physical exam not obtained as encounter was done via telephone.   Previous notes and tests were reviewed.  I discussed the assessment and treatment plan with the patient. The patient was provided an opportunity to ask questions and all were answered. The patient agreed with the plan and demonstrated an understanding of the instructions.   The patient was advised to call back or seek an in-person evaluation if the symptoms worsen or if the condition fails to improve as anticipated.  I provided 19 minutes of non-face-to-face time during this encounter.  It was my pleasure to participate in [place] care today. Please feel free to contact me with any questions or concerns.   Sincerely,  Valentina Shaggy, MD

## 2019-09-13 ENCOUNTER — Encounter: Payer: Self-pay | Admitting: Allergy & Immunology

## 2019-09-15 ENCOUNTER — Telehealth: Payer: Self-pay | Admitting: *Deleted

## 2019-09-15 NOTE — Telephone Encounter (Signed)
-----   Message from Valentina Shaggy, MD sent at 09/13/2019  6:41 AM EST ----- We are stopping Xolair and starting Dupixent instead. I just realized we do not have elevated eosinophils. She is on prednisone now and needed it in the spring as well. Should I order another CBC?

## 2019-09-15 NOTE — Telephone Encounter (Signed)
L./M for patient to contact me to advise Dupixent covered under her Ins and have signed her up for copay card and ready to submit to [facility] to change over from [place] to [place]

## 2019-09-15 NOTE — Progress Notes (Signed)
Sample of Jaclyn Haynes has been placed at the front desk.

## 2019-09-17 ENCOUNTER — Telehealth: Payer: Self-pay | Admitting: Allergy & Immunology

## 2019-09-17 MED ORDER — LEVOCETIRIZINE DIHYDROCHLORIDE 5 MG PO TABS: 10.0000 mg | ORAL_TABLET | Freq: Every day | ORAL | 5 refills | Status: DC | PRN

## 2019-09-17 NOTE — Telephone Encounter (Signed)
There is only the one formulation.  She is going to have to take 2 of them.  Salvatore Marvel, MD Allergy and [place] [facility] [place]

## 2019-09-17 NOTE — Telephone Encounter (Signed)
Patient called stating that Doctor Ernst Bowler was going to call in Xyzal 10 MG instead of 5MG . Patient states that the script is still 5MG  and would like 10MG 's called in to the pharmacy.  Uses [facility] on [address]  Please advise.

## 2019-09-17 NOTE — Addendum Note (Signed)
Addended by: Lucrezia Starch I on: 09/17/2019 02:53 PM   Modules accepted: Orders

## 2019-09-17 NOTE — Telephone Encounter (Signed)
Xyzal is only available in 5 mg tablets. Please advise and thank you.

## 2019-09-17 NOTE — Telephone Encounter (Signed)
Patient informed of medication instructions.

## 2019-09-25 NOTE — Telephone Encounter (Signed)
Spoke to patient and advised change of therapy to [place] and Rx to [facility]

## 2019-10-01 ENCOUNTER — Ambulatory Visit: Payer: Self-pay

## 2019-10-14 ENCOUNTER — Telehealth: Payer: Self-pay | Admitting: Allergy & Immunology

## 2019-10-14 NOTE — Telephone Encounter (Signed)
Pt called and wants to know if she should get the covid shot. Her job is telling them to get it. [phone]

## 2019-10-14 NOTE — Telephone Encounter (Signed)
Spoke with patient and asked her questions regarding any anaphylactic reactions to component PEG. Patient states she recently started getting her Flu shots in the past two years and couldn't compare that to getting any anaphylactic reaction but she does remember taking a bowel prep which caused her to have extreme problems as she related close to anaphylactic reaction with nausea and vomiting. 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 Please advise on this and add patient to the PEG testing list.

## 2019-10-15 ENCOUNTER — Encounter: Payer: Self-pay | Admitting: Neurology

## 2019-10-15 NOTE — Telephone Encounter (Signed)
That is an excellent idea.  We will add her to our Excel file.  We are getting this protocol finalized and scheduling already.  Salvatore Marvel, MD Allergy and [place] [facility] [place]

## 2019-10-15 NOTE — Progress Notes (Unsigned)
Denied, spoke with Dr Posey Pronto and she stated to resubmit the PA.  Resubmitted Key: [id number]

## 2019-10-15 NOTE — Progress Notes (Unsigned)
PA submitted  Key: [id number]

## 2019-10-15 NOTE — Telephone Encounter (Signed)
noted 

## 2019-10-20 ENCOUNTER — Encounter: Payer: Self-pay | Admitting: *Deleted

## 2019-10-20 NOTE — Progress Notes (Signed)
[name], [place] [address] Hours of Operations: Address: 5 a.m. - 10 p.m. PT, Monday-Friday [id number] 6 a.m. - 3 p.m. PT, [place] [address] Date: 10/20/2019 To: Narda Amber From: [facility] Phone: [phone] Phone: Fax: [phone] Reference #: [id number] RE: Prior Authorization Request Patient Name: Jaclyn Haynes Patient DOB: 29-Mar-1971 Patient ID: [id number] Status of Request: Cancelled Medication Name: Aimovig Inj 70mg /Ml GPI/NDC: [id number] We received your request for prior authorization for Aimovig, for the above member; however, [facility] has a denied request on file for [place] for this member. Please follow the appeals process outlined in the original denial or contact Prior Authorization Department at [phone] for further questions. Thank You If the treating physician would like to discuss this coverage decision with the physician or health care professional reviewer, please call [facility] Prior Authorization department at .

## 2019-10-20 NOTE — Progress Notes (Addendum)
Jaclyn Haynes (Key: [id number]) Rx #: [id number] Aimovig 70MG /ML auto-injectors   Form [facility] Electronic Prior Authorization Form (2017 NCPDP) Created 4 days ago Sent to Plan 37 minutes ago Plan Response 37 minutes ago Submit Clinical Questions 4 minutes ago Determination Wait for Determination Please wait for [facility] 2017 NCPDP to return a determination.

## 2019-10-21 NOTE — Progress Notes (Signed)
Unable to get Aimovig approved as the PA has been denied twice and we would need to wait 6 months to try again.   Instead, the pharmacist clinical reviewer was able to submit PA for [facility], so I will start this.  Please inform patient that Emgality is injection similar to Ajovy, which she was on previously.  This requires initial loading dose where she will administer (2) subcutaneous injections on the same day, followed by maintenance dose which is administered as one injection every 28 days.  If she is agreeable to starting Emgality, please let me know and I will send the prescription.  There is a savings card that she can use by going to [email or web address] or call [phone].

## 2019-10-22 ENCOUNTER — Encounter: Payer: Self-pay | Admitting: *Deleted

## 2019-10-22 ENCOUNTER — Telehealth: Payer: Self-pay

## 2019-10-22 MED ORDER — EMGALITY 120 MG/ML ~~LOC~~ SOAJ: 120.0000 mg | SUBCUTANEOUS | 11 refills | Status: DC

## 2019-10-22 MED ORDER — EMGALITY 120 MG/ML ~~LOC~~ SOAJ: 240.0000 mg | Freq: Once | SUBCUTANEOUS | 0 refills | Status: AC

## 2019-10-22 NOTE — Progress Notes (Signed)
As a result of submitting PA for Aimovig twice we were unable to get an appeal to reverse the decision. Dr. Posey Pronto spoke with the pharmacist and received approval for Emgality 120mg /ML (31ml/month valid  11/21/2019). Then 1 ml per month valid until 04/19/2020 Letter sent to scan into her chart

## 2019-10-22 NOTE — Telephone Encounter (Signed)
Pt called back no answer  

## 2019-10-22 NOTE — Telephone Encounter (Signed)
Rx has been sent with two prescriptions - one for loading dose and one to inject every 28 days as maintenance dose.  Thanks.

## 2019-10-22 NOTE — Telephone Encounter (Signed)
Spoke with Mrs Strain and she would like to do the [employer]

## 2019-10-22 NOTE — Telephone Encounter (Signed)
Pt's insurance would not approve Ajovy. They will approve Emagility. A PA has already been approved. Pt will need to administer 2 injections for the first dose and then every 28 days thereafter.  Need to ask pt if she is ok with changing medication from Melfa to [employer]. The two meds are similar per Posey Pronto.  Tried calling pt. No answer and vmail is full.

## 2019-10-22 NOTE — Telephone Encounter (Signed)
The following message was left with [employer] on 10/22/19 at 12:11 PM.  Caller missed a call from the office.

## 2019-10-28 NOTE — Telephone Encounter (Signed)
Per Ernst Bowler, patient needs scheduled for vaccine testing

## 2019-10-29 NOTE — Telephone Encounter (Signed)
Left voicemail for patient to call back regarding scheduling component testing.

## 2019-11-05 NOTE — Telephone Encounter (Signed)
Patient called back and is scheduled on 3/9 for covid component testing

## 2019-12-02 ENCOUNTER — Other Ambulatory Visit: Payer: Self-pay

## 2019-12-02 ENCOUNTER — Encounter: Payer: Self-pay | Admitting: Allergy & Immunology

## 2019-12-02 ENCOUNTER — Ambulatory Visit: Payer: 59 | Admitting: Allergy & Immunology

## 2019-12-02 VITALS — BP 150/64 | HR 82 | Temp 97.9°F | Resp 18 | Ht 63.5 in | Wt 254.2 lb

## 2019-12-02 DIAGNOSIS — J302 Other seasonal allergic rhinitis: Secondary | ICD-10-CM

## 2019-12-02 DIAGNOSIS — Z7185 Encounter for immunization safety counseling: Secondary | ICD-10-CM

## 2019-12-02 DIAGNOSIS — Z7189 Other specified counseling: Secondary | ICD-10-CM

## 2019-12-02 DIAGNOSIS — T782XXD Anaphylactic shock, unspecified, subsequent encounter: Secondary | ICD-10-CM

## 2019-12-02 DIAGNOSIS — J4540 Moderate persistent asthma, uncomplicated: Secondary | ICD-10-CM

## 2019-12-02 DIAGNOSIS — J3089 Other allergic rhinitis: Secondary | ICD-10-CM

## 2019-12-02 MED ORDER — SPIRIVA RESPIMAT 1.25 MCG/ACT IN AERS: 2.0000 | INHALATION_SPRAY | Freq: Every day | RESPIRATORY_TRACT | 5 refills | Status: DC

## 2019-12-02 NOTE — Patient Instructions (Addendum)
1. Concern for COVID19 allergy - Testing today was negative, including the challenge to the Miralax (PEG). - I think that you will tolerate the COVID vaccine well. - There is currently no definite skin testing available for the [place] and [place] vaccines and data is limited however, there has been a concern regarding sensitivity to PEG in patients who had anaphylactic reactions to the COVID-19 vaccine. - The best course of action would be to receive the [employer] vaccine (which does not contain PEG at all), although I think you should receive whichever one is offered (especially in light of the fact that you are working directly with the public).  - Skin testing was negative to Miralax (source of PEG 3350), methylprednisolone acetate (source of PEG 3350), and triamcinolone acetonide (source of polysorbate-80). - Ideally, after a negative skin testing a test dose of the vaccine is given in the office but as we do not have access to the vaccine we were unable to do that part. - Call us when you receive the vaccine to give [place] an update.  2. Return in about 2 weeks (around 12/16/2019) for a START INJECTION APPT (Between) and around three months for a regular follow up visit. This can be an in-person, a virtual Webex or a telephone follow up visit.   Please inform us of any Emergency Department visits, hospitalizations, or changes in symptoms. Call us before going to the ED for breathing or allergy symptoms since we might be able to fit you in for a sick visit. Feel free to contact us anytime with any questions, problems, or concerns.  It was a pleasure to see you again today!  Websites that have reliable patient information: 1. American Academy of Asthma, Allergy, and Immunology: www.aaaai.org 2. Food Allergy Research and Education (FARE): foodallergy.org 3. Mothers of Asthmatics: http://www.asthmacommunitynetwork.org 4. American College of Allergy, Asthma, and Immunology:  www.acaai.org   COVID-19 Vaccine Information can be found at: ShippingScam.co.uk For questions related to vaccine distribution or appointments, please email [email or web address] or call [phone].     "Like" [place] on Facebook and Instagram for our latest updates!        Make sure you are registered to vote! If you have moved or changed any of your contact information, you will need to get this updated before voting!  In some cases, you MAY be able to register to vote online: [email or web address]

## 2019-12-02 NOTE — Progress Notes (Signed)
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 Concern for COVID19 allergy - Testing today was negative, including the challenge to the Miralax (PEG). - I think that you will tolerate the COVID vaccine well. - There is currently no definite skin testing available for the [place] and [place] vaccines and data is limited however, there has been a concern regarding sensitivity to PEG in patients who had anaphylactic reactions to the COVID-19 vaccine. - The best course of action would be to receive the [employer] vaccine (which does not contain PEG at all), although I think you should receive whichever one is offered (especially in light of the fact that you are working directly with the public).  - Skin testing was negative to Miralax (source of PEG 3350), methylprednisolone acetate (source of PEG 3350), and triamcinolone acetonide (source of polysorbate-80). - Ideally, after a negative skin testing a test dose of the vaccine is given in the office but as we do not have access to the vaccine we were unable to do that part. - Call us when you receive the vaccine to give [place] an update.  2. Return in about 2 weeks (around 12/16/2019) for a START INJECTION APPT ([place]) and around three months for a regular follow up visit. This can be an in-person, a virtual Webex or a telephone follow up visit.   Subjective:   Jaclyn Haynes is a 49 y.o. female presenting today for follow up of  Chief Complaint  Patient presents with  . Other    component testing    Jaclyn  R Haynes has a history of the following: Patient Active Problem List   Diagnosis Date Noted  . Angioedema of lips 05/21/2016  . Allergic reaction 05/21/2016  . Palpitation 08/04/2015  . Migraine without aura and without status migrainosus, not intractable 10/27/2014  . Dizziness and giddiness 10/27/2013  . Disturbance of skin sensation 10/27/2013    History obtained from: chart review and patient.  Jaclyn Haynes is a 49 y.o. female presenting for skin testing.  At that visit, we discussed her adverse reaction to Xolair, which consisted of itching around the injection site.  She decided to continue to hold off on Xolair.  We did discuss starting Dupixent to help her with her asthma control.  However, she felt that the Spiriva in addition to her Symbicort had helped quite a bit with her breathing.  Therefore, she did not want to change anything at this point.   She called in early January reporting concerns with the COVID-19 vaccine. Specifically, her concerns are with the use of polyethylene glycol as a bowel prep.  She developed nausea and vomiting with exposure to this and described it as an anaphylactic reaction.  She has not been getting the flu shots the last few years because of her history of multiple reactions.  Therefore, we decided to do Covid component testing on her.  Since last visit, she has done well.  She has been off of her antihistamines.  She has not been on Xolair for several months at this point.  Her last injection per our records was December 2020.  Otherwise, there have been no changes to her past medical history, surgical history, family history, or social history.    Review of Systems  Constitutional: Negative.  Negative for chills, fever, malaise/fatigue and weight loss.  HENT: Negative.  Negative for congestion, ear discharge and ear pain.   Eyes: Negative for pain, discharge and redness.  Respiratory: Negative for cough, sputum production, shortness of breath and  wheezing.   Cardiovascular: Negative.  Negative for chest pain and palpitations.  Gastrointestinal: Negative for abdominal pain, constipation, diarrhea, heartburn, nausea and vomiting.  Skin: Negative.  Negative for itching and rash.  Neurological: Negative for dizziness and headaches.  Endo/Heme/Allergies: Negative for environmental allergies. Does not bruise/bleed easily.       Objective:   Blood pressure (!) 150/64, pulse 82, temperature 97.9 F (36.6 C), temperature source Temporal, resp. rate 18, height 5' 3.5" (1.613 m), weight 254 lb 3.2 oz (115.3 kg), SpO2 97 %. Body mass index is 44.32 kg/m.   Physical Exam: deferred since this was skin testing appoointment   Diagnostic studies:   Allergy Studies:         Salvatore Marvel, MD  Allergy and [facility]

## 2019-12-11 ENCOUNTER — Ambulatory Visit: Payer: 59 | Admitting: Allergy & Immunology

## 2019-12-18 ENCOUNTER — Ambulatory Visit: Payer: 59 | Admitting: Allergy & Immunology

## 2019-12-30 ENCOUNTER — Ambulatory Visit (INDEPENDENT_AMBULATORY_CARE_PROVIDER_SITE_OTHER): Payer: 59

## 2019-12-30 ENCOUNTER — Other Ambulatory Visit: Payer: Self-pay

## 2019-12-30 DIAGNOSIS — J4540 Moderate persistent asthma, uncomplicated: Secondary | ICD-10-CM

## 2019-12-30 MED ORDER — DUPILUMAB 300 MG/2ML ~~LOC~~ SOSY
600.0000 mg | PREFILLED_SYRINGE | Freq: Once | SUBCUTANEOUS | Status: AC
  Administered 2019-12-30: 600 mg via SUBCUTANEOUS

## 2019-12-30 NOTE — Progress Notes (Signed)
Immunotherapy   Patient Details  Name: VIRTUE KLINT MRN: [id number] Date of Birth: 10-12-1970  12/30/2019  Morrie Sheldon started injections for  Dupixent. Patient will receive loading dose of 600 mg today then 300 mg injection every 2 weeks. Patient waited 30 minutes post injection Consent signed and patient instructions given.   Rosalio Loud 12/30/2019, 4:09 PM

## 2020-01-01 ENCOUNTER — Telehealth: Payer: Self-pay

## 2020-01-01 MED ORDER — OLOPATADINE HCL 0.2 % OP SOLN: 1.0000 [drp] | Freq: Every day | OPHTHALMIC | 5 refills | Status: DC | PRN

## 2020-01-01 NOTE — Addendum Note (Signed)
Addended by: Herbie Drape on: 01/01/2020 03:36 PM   Modules accepted: Orders

## 2020-01-01 NOTE — Telephone Encounter (Signed)
I definitely maximize her antihistamines with twice daily dosing.  I would also add on eyedrops such as Pataday tho.  Salvatore Marvel, MD Allergy and [facility]

## 2020-01-01 NOTE — Telephone Encounter (Signed)
Patient started Dupixent injections Tuesday of this week. She is now complaining of warmth and itching at the injection sites. She is also having redness and scratchy feeling the right corner of one of her eyes. She is taking antihistamines. I did let her know that she can use an ice pack for the heat as well as hydrocortisone cream to help topically with the itching. Please advise on further instructions and thank you.

## 2020-01-01 NOTE — Telephone Encounter (Signed)
Patient called back and has been informed of the recommendation. She states she has only been taking her antihistamine (Xyzal) once a day and I did tell her per the instructions in her chart she was supposed to take it twice daily as needed. She states she will have to go home and check to see what her bottle says since she has been taking it once a day.

## 2020-01-12 ENCOUNTER — Encounter: Payer: Self-pay | Admitting: Allergy & Immunology

## 2020-01-12 MED ORDER — NORTRIPTYLINE HCL 10 MG PO CAPS: 10.0000 mg | ORAL_CAPSULE | Freq: Every day | ORAL | 3 refills | Status: DC

## 2020-01-14 ENCOUNTER — Ambulatory Visit: Payer: Self-pay

## 2020-01-20 ENCOUNTER — Encounter: Payer: Self-pay | Admitting: Allergy & Immunology

## 2020-01-22 NOTE — Telephone Encounter (Signed)
Call has been handled on the 01/20/20 encounter.

## 2020-01-30 ENCOUNTER — Ambulatory Visit (INDEPENDENT_AMBULATORY_CARE_PROVIDER_SITE_OTHER): Payer: 59 | Admitting: Allergy & Immunology

## 2020-01-30 ENCOUNTER — Other Ambulatory Visit: Payer: Self-pay

## 2020-01-30 ENCOUNTER — Encounter: Payer: Self-pay | Admitting: Allergy & Immunology

## 2020-01-30 DIAGNOSIS — J4540 Moderate persistent asthma, uncomplicated: Secondary | ICD-10-CM | POA: Diagnosis not present

## 2020-01-30 DIAGNOSIS — T7800XD Anaphylactic reaction due to unspecified food, subsequent encounter: Secondary | ICD-10-CM

## 2020-01-30 DIAGNOSIS — T50905A Adverse effect of unspecified drugs, medicaments and biological substances, initial encounter: Secondary | ICD-10-CM | POA: Diagnosis not present

## 2020-01-30 DIAGNOSIS — J3089 Other allergic rhinitis: Secondary | ICD-10-CM

## 2020-01-30 DIAGNOSIS — J302 Other seasonal allergic rhinitis: Secondary | ICD-10-CM

## 2020-01-30 MED ORDER — ALBUTEROL SULFATE HFA 108 (90 BASE) MCG/ACT IN AERS: 2.0000 | INHALATION_SPRAY | Freq: Four times a day (QID) | RESPIRATORY_TRACT | 1 refills | Status: DC | PRN

## 2020-01-30 MED ORDER — MONTELUKAST SODIUM 10 MG PO TABS: ORAL_TABLET | ORAL | 5 refills | Status: DC

## 2020-01-30 MED ORDER — BUDESONIDE-FORMOTEROL FUMARATE 160-4.5 MCG/ACT IN AERO: 2.0000 | INHALATION_SPRAY | Freq: Two times a day (BID) | RESPIRATORY_TRACT | 5 refills | Status: DC

## 2020-01-30 MED ORDER — TRIAMCINOLONE ACETONIDE 0.1 % EX OINT: 1.0000 "application " | TOPICAL_OINTMENT | Freq: Two times a day (BID) | CUTANEOUS | 0 refills | Status: DC

## 2020-01-30 NOTE — Patient Instructions (Addendum)
1. Moderate persistent asthma, uncomplicated - Lung testing deferred since we did a telephone visit.  - We will not make any medication changes at this time aside from stopping the [place].  - Add on triamcinolone ointment three times daily for one week to see if this can improve it.  - Daily controller medication(s): Symbicort 160/4.5 two puffs twice daily with spacer - Rescue medications: ProAir 4 puffs every 4-6 hours as needed - Asthma control goals:  * Full participation in all desired activities (may need albuterol before activity) * Albuterol use two time or less a week on average (not counting use with activity) * Cough interfering with sleep two time or less a month * Oral steroids no more than once a year * No hospitalizations  2. Perennial allergic rhinitis (grasses, weeds, trees, mold, roach, and dust mites) - Continue with Nasacort OTC 1-2 sprays per nostril daily.  - Continue with Xyzal 5mg  daily (you can use one twice daily if needed).  3. Adverse food reaction (peanuts, tree nuts, shrimp, tomato, strawberry) - Continue to avoid peanuts, tree nuts, shrimp, and tomato. - We will send in a prescription for AuviQ (epinephrine autoinjector).  - Anaphylaxis management plan updated.   4. Return in about 4 months (around 06/01/2020). This can be an in-person, a virtual Webex or a telephone follow up visit.   Please inform us of any Emergency Department visits, hospitalizations, or changes in symptoms. Call us before going to the ED for breathing or allergy symptoms since we might be able to fit you in for a sick visit. Feel free to contact us anytime with any questions, problems, or concerns.  It was a pleasure to talk to you today today!  Websites that have reliable patient information: 1. American Academy of Asthma, Allergy, and Immunology: www.aaaai.org 2. Food Allergy Research and Education (FARE): foodallergy.org 3. Mothers of Asthmatics:  http://www.asthmacommunitynetwork.org 4. American College of Allergy, Asthma, and Immunology: www.acaai.org   COVID-19 Vaccine Information can be found at: ShippingScam.co.uk For questions related to vaccine distribution or appointments, please email [email or web address] or call [phone].     "Like" [place] on Facebook and Instagram for our latest updates!       HAPPY SPRING!  Make sure you are registered to vote! If you have moved or changed any of your contact information, you will need to get this updated before voting!  In some cases, you MAY be able to register to vote online: [email or web address]

## 2020-01-30 NOTE — Progress Notes (Signed)
RE: Jaclyn Haynes MRN: [id number] DOB: 10-19-1970 Date of Telemedicine Visit: 01/30/2020  Referring provider: Lennie Odor, PA Primary care provider: Lennie Odor, PA  Chief Complaint: Asthma (Having episodes of SOB )   Telemedicine Follow Up Visit via Telephone: I connected with Jaclyn Haynes for a follow up on 01/30/20 by telephone and verified that I am speaking with the correct person using two identifiers.   I discussed the limitations, risks, security and privacy concerns of performing an evaluation and management service by telephone and the availability of in person appointments. I also discussed with the patient that there may be a patient responsible charge related to this service. The patient expressed understanding and agreed to proceed.  Patient is at work.  Provider is at the office.  Visit start time: 10:13 AM Visit end time: 10:34 AM Insurance consent/check in by: [place] consent and medical assistant/nurse: Jaclyn Haynes  History of Present Illness:  She is a 49 y.o. female, who is being followed for moderate persistent asthma, perennial and seasonal allergic rhinitis, and multiple food allergies. Her previous allergy office visit was in March 2021 with myself.  At that time, she underwent COVID-19 vaccine component testing and passed all steps.   In the interim, she has started having reactions to her dupilumab as well.  She had previously had reactions to the Xolair, resulting in worsening itching.  With the dupilumab, she was having rashes around the injection site.  We originally added these to help with both her asthma as well as her allergic reactions.  From an asthma perspective when I saw her in December 2020, her control was fairly good with the Symbicort and Spiriva.  She has never been on a triple therapy medication aside from one time when we gave her a sample of Breztri.  Since last visit, she actually reports that the [place] lead to an adverse  affect regarding her breakouts. She was having worse itching and breakouts in addition to her injection site reactions.  Asthma/Respiratory Symptom History: Asthma is well controlled right now. She is having her rescue inhaler 1-2 times per week. She is having SOB with physical activity. It certainlky is not as bad at it was before. Nothing like it was before we started seeing her. We did give her a sample of the Breztri at one point. She did not notice any improvement in it.  She would like to just stay where she is at.  Allergic Rhinitis Symptom History: She has been having some allergy symptoms. She tends to stay stopped up and her throat hurts. Typically winter becomes the worse time of the year for her symptoms. This year, she is having worse symptoms in the spring. She has done fairly well over the winter with regards to her allergies.  She has looked into starting allergen immunotherapy, but her insurance told her that each shot visit would be treated as a specialist visit with an accompanying copayment.  She did not feel that she could afford that.  Food Allergy Symptom History: She continues to avoid peanuts, tree nuts, shrimp, and tomato. She did eat some strawberries and had a reaction. She was breaking out over her face and was reacting over her entire body. She does think that she needs a new EpiPen.   Otherwise, there have been no changes to her past medical history, surgical history, family history, or social history.  Assessment and Plan:  Jaclyn Haynes is a 49 y.o. 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 Moderate persistent asthma,  uncomplicated - Lung testing deferred since we did a telephone visit.  - We will not make any medication changes at this time aside from stopping the [place].  - Add on triamcinolone ointment three times daily for one week to see if this can improve it.  - Daily controller medication(s): Symbicort 160/4.5 two puffs twice daily with spacer - Rescue medications: ProAir 4 puffs every 4-6 hours as needed - Asthma control goals:  * Full participation in all desired activities (may need albuterol before activity) * Albuterol use two time or less a week on average (not counting use with activity) * Cough interfering with sleep two time or less a month * Oral steroids no more than once a year * No hospitalizations  2. Perennial allergic rhinitis (grasses, weeds, trees, mold, roach, and dust mites) - Continue with Nasacort OTC 1-2 sprays per nostril daily.  - Continue with Xyzal 5mg  daily (you can use one twice daily if needed).  3. Adverse food reaction (peanuts, tree nuts, shrimp, tomato, strawberry) - Continue to avoid peanuts, tree nuts, shrimp, and tomato. - We will send in a prescription for AuviQ (epinephrine autoinjector).  - Anaphylaxis management plan updated.   4. Return in about 4 months (around 06/01/2020). This can be an in-person, a virtual Webex or a telephone follow up visit.   Diagnostics: None.  Medication List:  Current Outpatient Medications  Medication Sig Dispense Refill  . acetaminophen (TYLENOL) 650 MG CR tablet Take 1,300 mg by mouth every 8 (eight) hours as needed for pain.    Marland Kitchen albuterol (VENTOLIN HFA) 108 (90 Base) MCG/ACT inhaler Inhale 2 puffs into the lungs every 6 (six) hours as needed for wheezing. 18 g 1  . ASHLYNA 0.15-0.03 &0.01 MG tablet Take 0.15 mg by mouth daily.    . budesonide-formoterol (SYMBICORT) 160-4.5 MCG/ACT inhaler Inhale 2 puffs into the lungs 2 (two) times daily. 1 Inhaler 5  . Calcium Carbonate (CALCIUM 600 PO) Take 1 tablet by mouth  daily.    . carvedilol (COREG CR) 40 MG 24 hr capsule Take 40 mg by mouth daily.    . Clindamycin-Benzoyl Per, Refr, gel Apply to face every morning    . cyclobenzaprine (FLEXERIL) 10 MG tablet cyclobenzaprine 10 mg tablet    . dimenhyDRINATE (DRAMAMINE PO) Take by mouth.    . EPINEPHrine (AUVI-Q) 0.3 mg/0.3 mL IJ SOAJ injection Inject 0.3 mLs (0.3 mg total) into the muscle as needed for anaphylaxis. 2 Device 1  . Erenumab-aooe (AIMOVIG) 70 MG/ML SOAJ Inject 70 mg into the skin every 28 (twenty-eight) days. 1 pen 11  . Erenumab-aooe (AIMOVIG) 70 MG/ML SOAJ Aimovig Autoinjector 70 mg/mL subcutaneous auto-injector  ADM 1 ML [place] Q 28 DAYS    . famotidine (PEPCID) 10 MG tablet Take 10 mg by mouth 2 (two) times daily.    . Galcanezumab-gnlm (EMGALITY) 120 MG/ML SOAJ Inject 120 mg into the skin every 28 (twenty-eight) days. 1 pen 11  . glucosamine-chondroitin 500-400 MG tablet Take by mouth.    Marland Kitchen ibuprofen (ADVIL) 200 MG tablet Take 3 tablets (600 mg total) by mouth every 6 (six) hours as needed. 30 tablet 0  . levocetirizine (XYZAL) 5  MG tablet Take 2 tablets (10 mg total) by mouth daily as needed for allergies. 60 tablet 5  . losartan (COZAAR) 25 MG tablet losartan 25 mg tablet  TAKE 1 TABLET BY MOUTH EVERY DAY    . Lysine 500 MG CAPS Take 500 mg by mouth.    . montelukast (SINGULAIR) 10 MG tablet Take one tablet daily in the evening. 30 tablet 5  . naproxen sodium (ANAPROX) 550 MG tablet Take 550 mg by mouth 2 (two) times daily as needed.    . nortriptyline (PAMELOR) 10 MG capsule Take 1 capsule (10 mg total) by mouth at bedtime. Start nortriptyline 10mg  at bedtime for 2 week, then increase to 2 tablet at bedtime 60 capsule 3  . Olopatadine HCl (PATADAY) 0.2 % SOLN Place 1 drop into both eyes daily as needed. 2.5 mL 5  . omeprazole (PRILOSEC) 40 MG capsule TK ONE C PO  BID FOR GERD    . Probiotic Product (PROBIOTIC DAILY) CAPS Hold until Recommended to take by allergy specialist. (Patient taking  differently: Take 1 capsule by mouth daily. )    . spironolactone (ALDACTONE) 25 MG tablet Take one tablet by mouth daily with food    . tretinoin (RETIN-A) 0.025 % cream Apply a pea-sized amount to entire face every other night to nightly.    . valACYclovir (VALTREX) 500 MG tablet Take 1 tablet (500 mg total) by mouth daily. Hold until Recommended to take by allergy specialist.    . vitamin B-12 (CYANOCOBALAMIN) 1000 MCG tablet Take 1,000 mcg by mouth daily.    . Tiotropium Bromide Monohydrate (SPIRIVA RESPIMAT) 1.25 MCG/ACT AERS Inhale 2 puffs into the lungs daily. (Patient not taking: Reported on 01/30/2020) 4 g 5  . triamcinolone ointment (KENALOG) 0.1 % Apply 1 application topically 2 (two) times daily. 30 g 0   Current Facility-Administered Medications  Medication Dose Route Frequency Provider Last Rate Last Admin  . omalizumab Arvid Right) injection 300 mg  300 mg Subcutaneous Q14 Days Valentina Shaggy, MD   300 mg at 09/10/19 0932   Allergies: Allergies  Allergen Reactions  . Peanut Oil Anaphylaxis    Other reaction(s): Angioedema  . Peanut-Containing Drug Products Anaphylaxis and Hives  . Pollen Extract   . Shellfish Allergy   . Tomato Hives    Only raw tomato causes reaction  . Bee Venom Hives, Swelling and Rash    Swelling , more than localized  . Other Other (See Comments)    Allergy Testing Other reaction(s): Other (See Comments) Acidic fruits and vegetables, rash    I reviewed her past medical history, social history, family history, and environmental history and no significant changes have been reported from previous visits.  Review of Systems  Constitutional: Negative for activity change, appetite change, chills, diaphoresis, fatigue and fever.  HENT: Positive for postnasal drip, rhinorrhea, sinus pressure and sneezing. Negative for congestion, sinus pain and sore throat.   Eyes: Negative for pain, discharge, redness and itching.  Respiratory: Negative for shortness  of breath, wheezing and stridor.   Gastrointestinal: Negative for diarrhea, nausea and vomiting.  Endocrine: Negative for cold intolerance and heat intolerance.  Musculoskeletal: Negative for arthralgias, joint swelling and myalgias.  Skin: Negative for rash.  Allergic/Immunologic: Negative for environmental allergies and food allergies.    Objective:  Physical exam not obtained as encounter was done via telephone.   Previous notes and tests were reviewed.  I discussed the assessment and treatment plan with the patient. The patient was provided  an opportunity to ask questions and all were answered. The patient agreed with the plan and demonstrated an understanding of the instructions.   The patient was advised to call back or seek an in-person evaluation if the symptoms worsen or if the condition fails to improve as anticipated.  I provided 21 minutes of non-face-to-face time during this encounter.  It was my pleasure to participate in [place] care today. Please feel free to contact me with any questions or concerns.   Sincerely,  Valentina Shaggy, MD

## 2020-03-04 ENCOUNTER — Encounter: Payer: Self-pay | Admitting: Allergy & Immunology

## 2020-03-04 ENCOUNTER — Ambulatory Visit: Payer: 59 | Admitting: Allergy & Immunology

## 2020-03-04 ENCOUNTER — Other Ambulatory Visit: Payer: Self-pay

## 2020-03-04 VITALS — BP 124/82 | HR 86 | Temp 97.7°F | Resp 16

## 2020-03-04 DIAGNOSIS — J3089 Other allergic rhinitis: Secondary | ICD-10-CM

## 2020-03-04 DIAGNOSIS — J4540 Moderate persistent asthma, uncomplicated: Secondary | ICD-10-CM | POA: Diagnosis not present

## 2020-03-04 DIAGNOSIS — J302 Other seasonal allergic rhinitis: Secondary | ICD-10-CM | POA: Diagnosis not present

## 2020-03-04 DIAGNOSIS — T7800XD Anaphylactic reaction due to unspecified food, subsequent encounter: Secondary | ICD-10-CM

## 2020-03-04 MED ORDER — AIRDUO DIGIHALER 232-14 MCG/ACT IN AEPB: 1.0000 | INHALATION_SPRAY | Freq: Two times a day (BID) | RESPIRATORY_TRACT | 5 refills | Status: DC

## 2020-03-04 MED ORDER — IPRATROPIUM BROMIDE 0.06 % NA SOLN
1.0000 | Freq: Three times a day (TID) | NASAL | 5 refills | Status: DC
Start: 2020-03-04 — End: 2022-01-26

## 2020-03-04 NOTE — Progress Notes (Signed)
FOLLOW UP  Date of Service/Encounter:  03/04/20   Assessment:   Moderate persistent asthma without complication-with adverse reaction to Xolair and Dupixent  Adverse food reactions (peanuts, tree nuts, shrimp, tomato)- with low positives to peanut, tree nuts, tomato  Seasonal and perennial allergic rhinitis(grasses, weeds, trees, mold, roach, and dust mites) - with worsening symptoms as of late including rhinorrhea   Plan/Recommendations:   1. Moderate persistent asthma, uncomplicated - Lung testing number wise looked good, but the pattern was blunted. - I am not going to worry about it at this time.  - We will send in AirDuo, which is a similar medication with a cheaper copay. - Start AirDuo 232-14 one puff twice daily. - Copay card provided (if it does not work, call us and we can make magic happen). - There is an app that can be downloaded to go with this inhaler, but it is not necessary to use it.  - Daily controller medication(s): AirDuo 232-14 one puff twice daily - Rescue medications: ProAir 4 puffs every 4-6 hours as needed - Asthma control goals:  * Full participation in all desired activities (may need albuterol before activity) * Albuterol use two time or less a week on average (not counting use with activity) * Cough interfering with sleep two time or less a month * Oral steroids no more than once a year * No hospitalizations  2. Perennial allergic rhinitis (grasses, weeds, trees, mold, roach, and dust mites) - Continue with Nasacort OTC 1-2 sprays per nostril daily.  - Continue with Xyzal 5mg  daily (you can use one twice daily if needed).  - Add on nasal Atrovent one spray per nostril up to three times daily (can cause over drying so beware).   3. Adverse food reaction (peanuts, tree nuts, shrimp, tomato, strawberry) - Continue to avoid peanuts, tree nuts, shrimp, and tomato. Jaclyn Haynes is up to date.  - Anaphylaxis management plan updated.   4. Return  in about 4 months (around 07/04/2020). This can be an in-person, a virtual Webex or a telephone follow up visit.  Subjective:   Jaclyn Haynes is a 49 y.o. female presenting today for follow up of  Chief Complaint  Patient presents with  . Asthma  . Allergic Rhinitis     Jaclyn Haynes has a history of the following: Patient Active Problem List   Diagnosis Date Noted  . Angioedema of lips 05/21/2016  . Allergic reaction 05/21/2016  . Palpitation 08/04/2015  . Migraine without aura and without status migrainosus, not intractable 10/27/2014  . Dizziness and giddiness 10/27/2013  . Disturbance of skin sensation 10/27/2013    History obtained from: chart review and patient.  Jaclyn Haynes is a 49 y.o. female presenting for a follow up visit.  She is well-known to this practice.  She was last seen in May 2021.  This was a telephone visit.  We did not make any medication changes aside from stopping the [place] since she had a reaction to it.  She was having severe rashes from the Dupixent injections.  We continued the Symbicort 2 puffs twice daily as well as albuterol as needed.  For her allergic rhinitis, we continue with Nasacort as well as Xyzal.  She has a history of adverse food reactions.  We recommended continued avoidance of all of the foods and sent in a prescription for an epinephrine autoinjector.  Since the last visit, she has mostly done well. But she does report that she is having nasal  congestion, light headedness, dizziness, and other URI symptoms. She has been using the Nasacort less faithfully because it seemed to work less effectively if she used it every day. Therefore she only uses that a couple of times per week. She is not having a fever during this last month. She does report that the drainage is clear and she reports sore throat. She is not coughing phlegm up. She is having a lot of rhinorrhea.    She was put on prednisone for "inflammation" in her gums. She is starting  that dose pack tomorrow. This is a 6 day taper. She is going to have this rechecked next week.   She did end up doing fine with Moderna x2. She did this without a problem.   Asthma/Respiratory Symptom History: Asthma is well controlled. She does feel that she is having a flare up recently. She was around her brother and his family, who are all smokers. This seemed to make things worse. She did restart her Singulair at night. She is on the Symbicort two puffs twice daily.  Jaclyn Haynes's asthma has been well controlled. She has not required rescue medication, experienced nocturnal awakenings due to lower respiratory symptoms, nor have activities of daily living been limited. She has required no Emergency Department or Urgent Care visits for her asthma. She has required zero courses of systemic steroids for asthma exacerbations since the last visit. ACT score today is 17, indicating subpar asthma symptom control.   Allergic Rhinitis Symptom History: She is using her Nasacort but not regularly. She is using her Xyzal every day. She has not needed antibiotics at all.  Otherwise, there have been no changes to her past medical history, surgical history, family history, or social history.    Review of Systems  Constitutional: Negative.  Negative for chills, fever, malaise/fatigue and weight loss.  HENT: Positive for congestion and sinus pain. Negative for ear discharge and ear pain.   Eyes: Negative for pain, discharge and redness.  Respiratory: Negative for cough, sputum production, shortness of breath and wheezing.   Cardiovascular: Negative.  Negative for chest pain and palpitations.  Gastrointestinal: Negative for abdominal pain, constipation, diarrhea, heartburn, nausea and vomiting.  Skin: Negative.  Negative for itching and rash.  Neurological: Negative for dizziness and headaches.  Endo/Heme/Allergies: Positive for environmental allergies. Does not bruise/bleed easily.       Objective:   Blood  pressure 124/82, pulse 86, temperature 97.7 F (36.5 C), temperature source Temporal, resp. rate 16, SpO2 96 %. There is no height or weight on file to calculate BMI.   Physical Exam:  Physical Exam  Constitutional: She appears well-developed. She is cooperative.  HENT:  Head: Normocephalic and atraumatic.  Right Ear: Tympanic membrane, external ear and ear canal normal.  Left Ear: Tympanic membrane, external ear and ear canal normal.  Nose: No rhinorrhea.  Mouth/Throat: Mucous membranes are not dry.  Eyes: Pupils are equal, round, and reactive to light. Conjunctivae and lids are normal. Right eye exhibits no chemosis and no discharge. Left eye exhibits no chemosis and no discharge. Right conjunctiva is not injected. Left conjunctiva is not injected.  Cardiovascular: Normal rate, regular rhythm and normal heart sounds.  Respiratory: Effort normal and breath sounds normal. No accessory muscle usage. No tachypnea. No respiratory distress. She has no wheezes. She has no rhonchi. She has no rales. She exhibits no tenderness.  GI: There is no abdominal tenderness. There is no rebound and no guarding.  Lymphadenopathy:  Head (right side): No submandibular, no tonsillar and no occipital adenopathy present.       Head (left side): No submandibular, no tonsillar and no occipital adenopathy present.    She has no cervical adenopathy.  Neurological: She is alert.  Skin: No abrasion, no petechiae and no rash noted. Rash is not papular, not vesicular and not urticarial. No erythema. No pallor.  No eczematous and urticarial lesions noted.      Diagnostic studies:    Spirometry: results abnormal (FEV1: 1.59/70%, FVC: 2.50/89%, FEV1/FVC: 64%).    Spirometry consistent with mild obstructive disease.   Allergy Studies: none       Salvatore Marvel, MD  [facility]

## 2020-03-04 NOTE — Patient Instructions (Addendum)
1. Moderate persistent asthma, uncomplicated - Lung testing number wise looked good, but the pattern was blunted. - I am not going to worry about it at this time.  - We will send in AirDuo, which is a similar medication with a cheaper copay. - Start AirDuo 232-14 one puff twice daily. - Copay card provided (if it does not work, call us and we can make magic happen). - There is an app that can be downloaded to go with this inhaler, but it is not necessary to use it.  - Daily controller medication(s): AirDuo 232-14 one puff twice daily - Rescue medications: ProAir 4 puffs every 4-6 hours as needed - Asthma control goals:  * Full participation in all desired activities (may need albuterol before activity) * Albuterol use two time or less a week on average (not counting use with activity) * Cough interfering with sleep two time or less a month * Oral steroids no more than once a year * No hospitalizations  2. Perennial allergic rhinitis (grasses, weeds, trees, mold, roach, and dust mites) - Continue with Nasacort OTC 1-2 sprays per nostril daily.  - Continue with Xyzal 5mg  daily (you can use one twice daily if needed).  - Add on nasal Atrovent one spray per nostril up to three times daily (can cause over drying so beware).   3. Adverse food reaction (peanuts, tree nuts, shrimp, tomato, strawberry) - Continue to avoid peanuts, tree nuts, shrimp, and tomato. [name] is up to date.  - Anaphylaxis management plan updated.   4. Return in about 4 months (around 07/04/2020). This can be an in-person, a virtual Webex or a telephone follow up visit.   Please inform us of any Emergency Department visits, hospitalizations, or changes in symptoms. Call us before going to the ED for breathing or allergy symptoms since we might be able to fit you in for a sick visit. Feel free to contact us anytime with any questions, problems, or concerns.  It was a pleasure to see you again today!  Websites that  have reliable patient information: 1. American Academy of Asthma, Allergy, and Immunology: www.aaaai.org 2. Food Allergy Research and Education (FARE): foodallergy.org 3. Mothers of Asthmatics: http://www.asthmacommunitynetwork.org 4. American College of Allergy, Asthma, and Immunology: www.acaai.org   COVID-19 Vaccine Information can be found at: ShippingScam.co.uk For questions related to vaccine distribution or appointments, please email vaccine@ .com or call [phone].     "Like" [place] on Facebook and Instagram for our latest updates!        Make sure you are registered to vote! If you have moved or changed any of your contact information, you will need to get this updated before voting!  In some cases, you MAY be able to register to vote online: [email or web address]

## 2020-06-01 ENCOUNTER — Ambulatory Visit: Payer: Self-pay | Admitting: Allergy & Immunology

## 2020-06-11 IMAGING — MR MR MRA HEAD W/O CM
1 series · 11 of 48 positions shown · non-contrast
Comparison: None.

CLINICAL DATA: Headaches. Transient neurological symptoms.
Left-sided numbness.

EXAM:
MRI HEAD WITHOUT CONTRAST
MRA HEAD WITHOUT CONTRAST
TECHNIQUE: Multiplanar, multiecho pulse sequences of the brain and surrounding
structures were obtained without intravenous contrast. Angiographic
images of the head were obtained using MRA technique without
contrast.

[Series 5: [id number] · axial · 0.6mm · 0.26mm/px · z∈[-93,-6]mm · 11 of 162 slices shown]
[im 11/162]
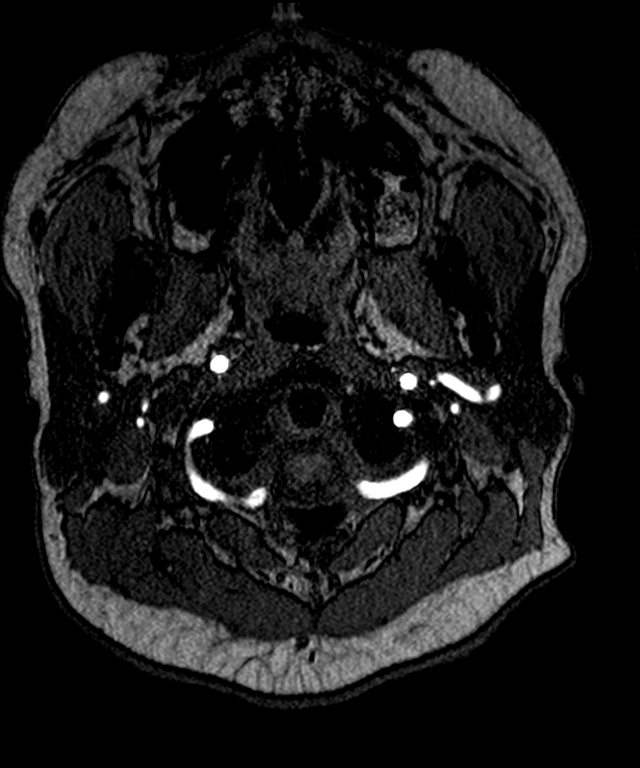
[im 28/162]
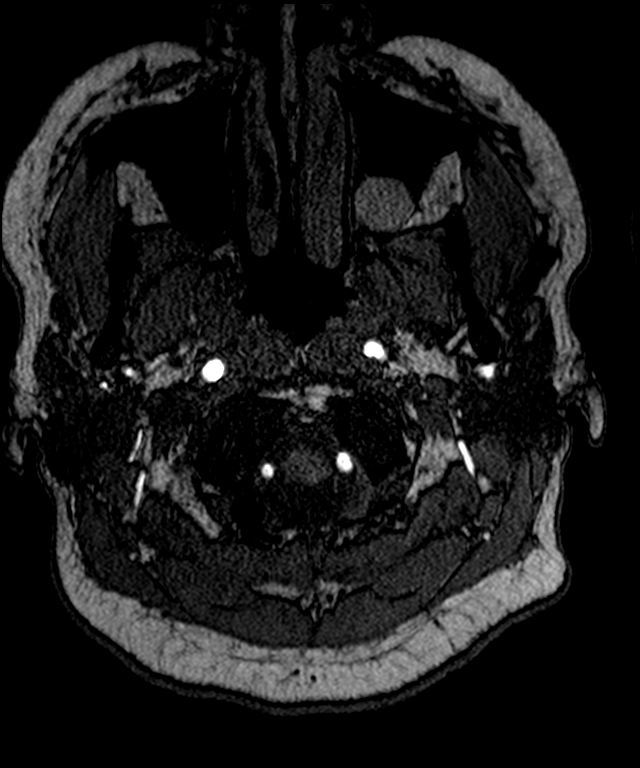
[im 31/162]
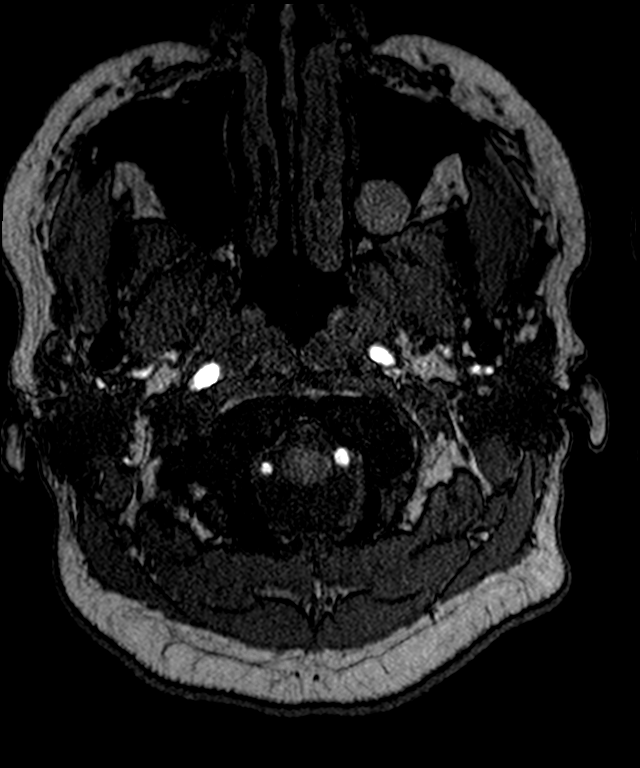
[im 52/162]
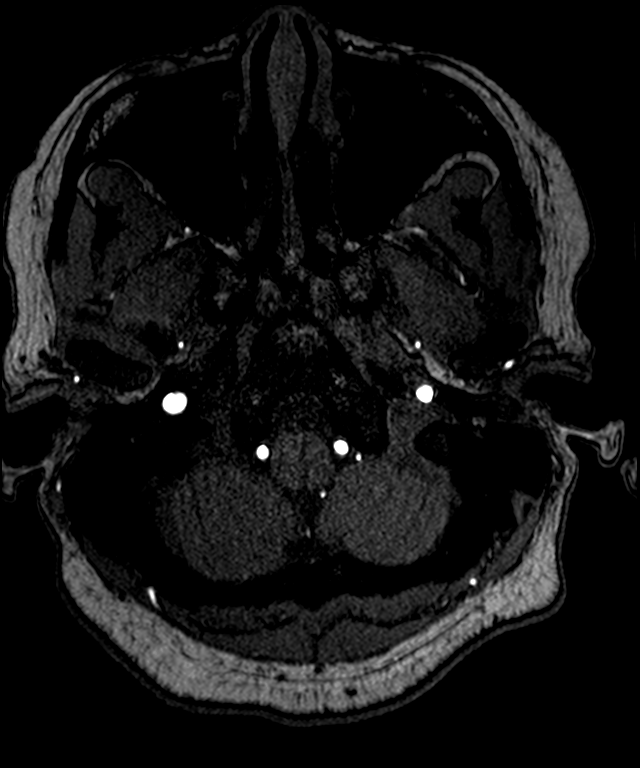
[im 72/162]
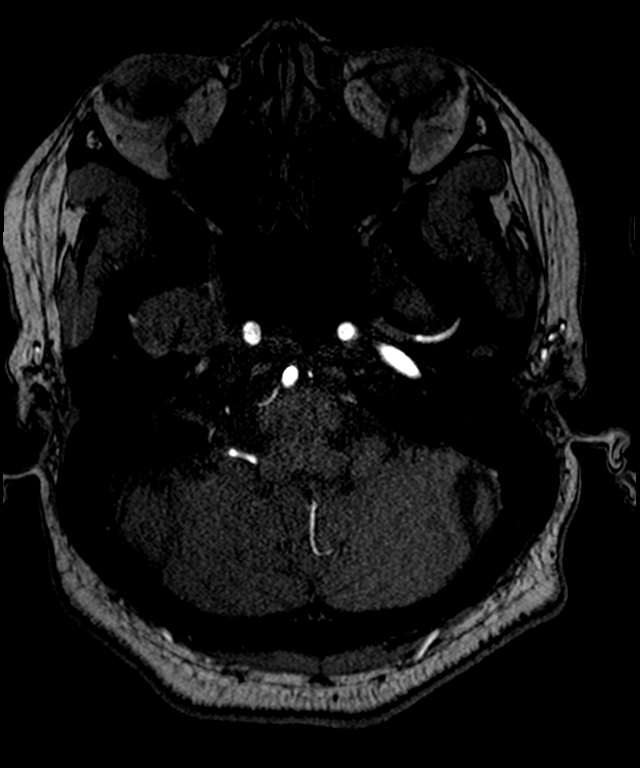
[im 83/162]
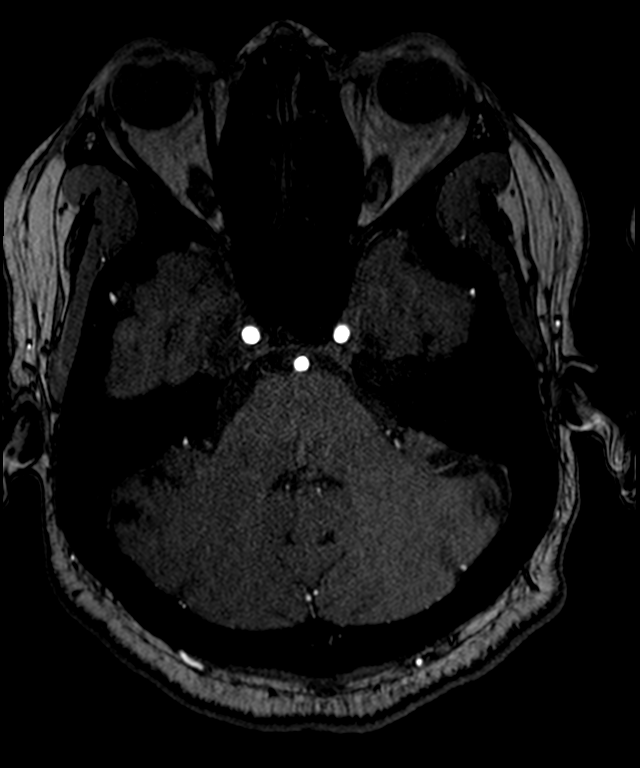
[im 93/162]
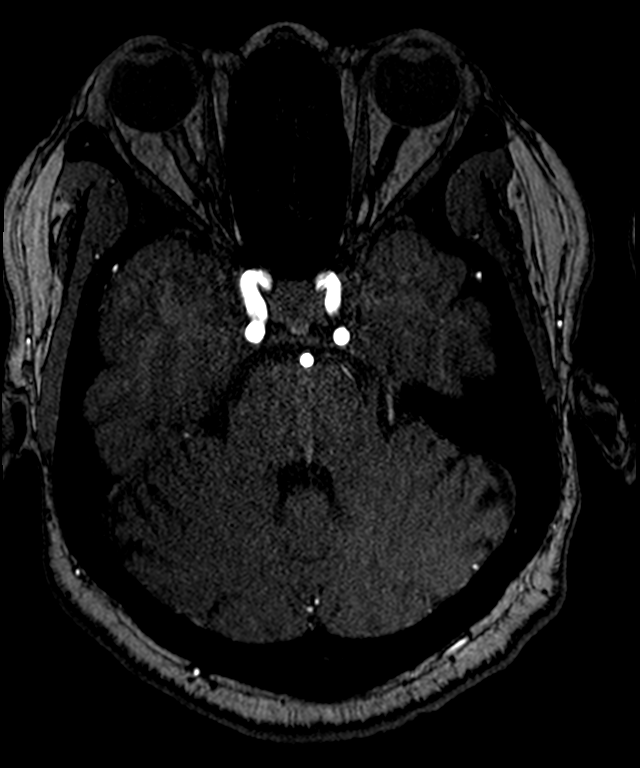
[im 114/162]
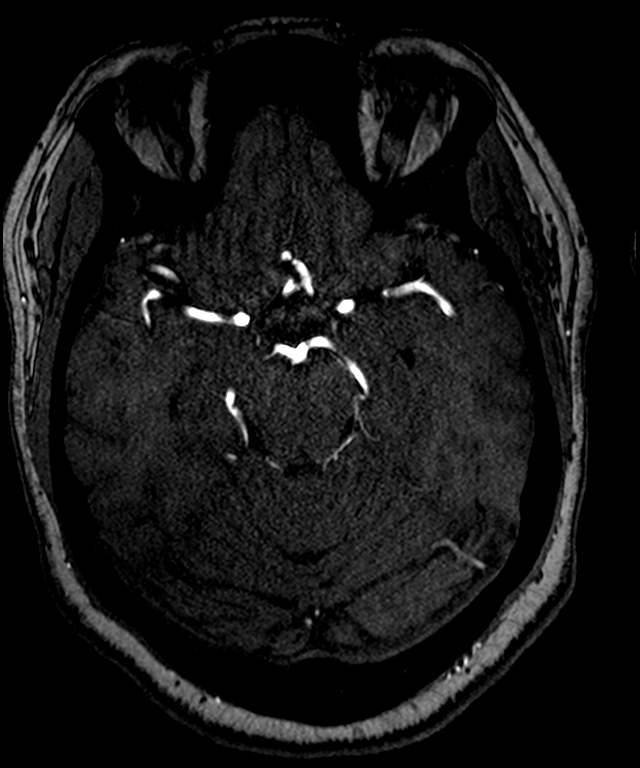
[im 134/162]
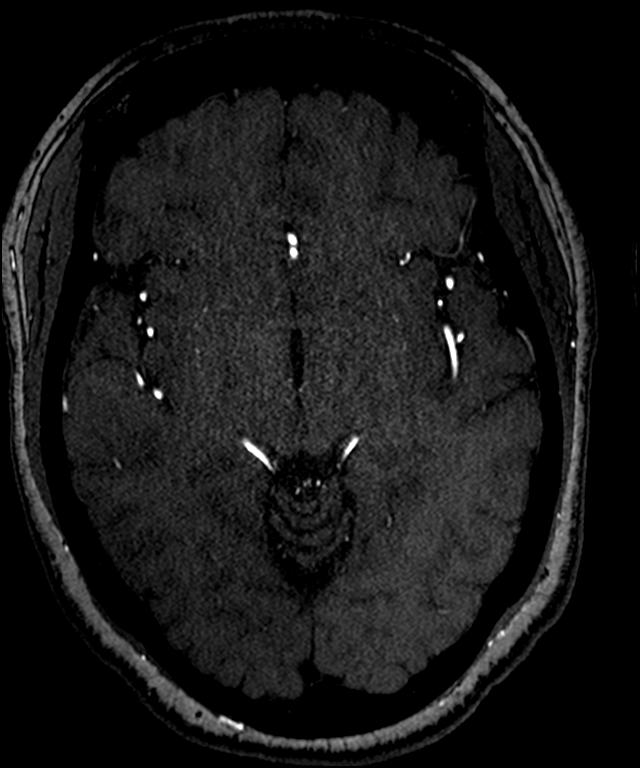
[im 138/162]
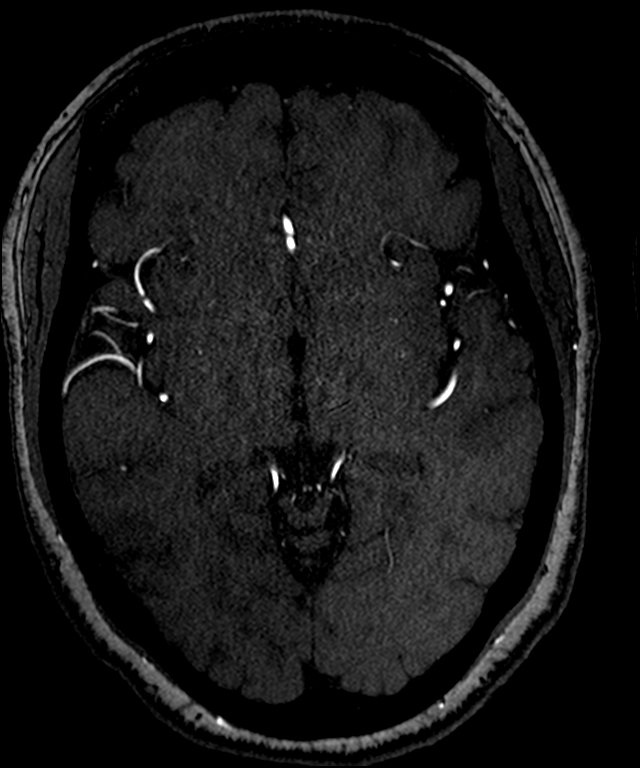
[im 155/162]
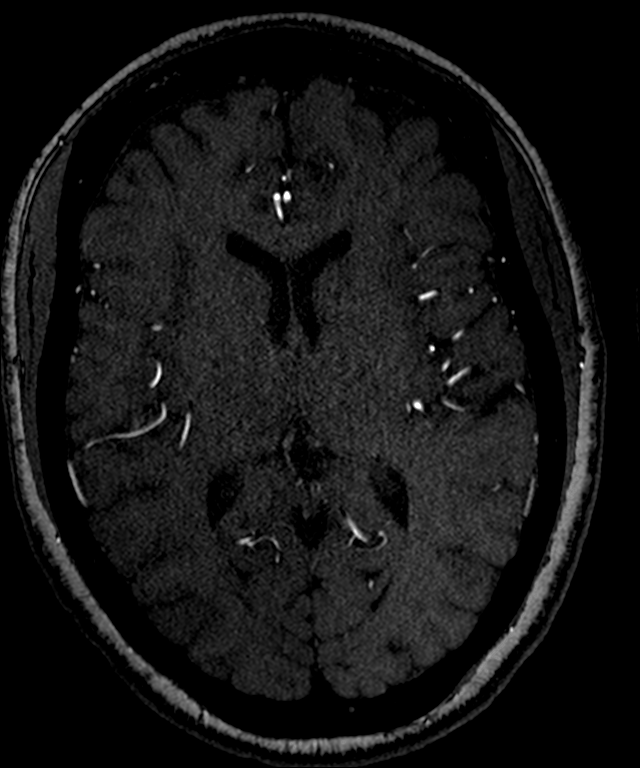

[11 of 48 positions shown; findings below may reference images not displayed]

FINDINGS: MRI HEAD FINDINGS

Multiple attempts at obtaining IV access were unsuccessful, and a
noncontrast examination was performed.

Brain: There is no evidence of acute infarct, intracranial
hemorrhage, mass, midline shift, or extra-axial fluid collection.
The ventricles and sulci are normal. There are at most a few
punctate foci of T2 FLAIR hyperintensity in the cerebral white
matter, not considered abnormal for age. There are no findings to
suggest demyelinating disease.

Vascular: Major intracranial vascular flow voids are preserved.

Skull and upper cervical spine: Unremarkable bone marrow signal.

Sinuses/Orbits: Unremarkable orbits. Small left maxillary sinus
mucous retention cyst. Clear mastoid air cells.

Other: None.

MRA HEAD FINDINGS

The visualized distal vertebral arteries are widely patent to the
basilar and codominant. Patent PICAs, AICAs, and SCAs are seen
bilaterally. The basilar artery is widely patent. Small to medium
sized posterior communicating arteries are present bilaterally. Both
PCAs are patent without evidence of significant stenosis.

The internal carotid arteries are widely patent from skull base to
carotid termini. ACAs and MCAs are patent without evidence of
proximal branch occlusion or significant proximal stenosis. The
anterior communicating artery is unremarkable. No aneurysm is
identified.
IMPRESSION: 1. Negative noncontrast head MRI (unable to obtain IV access).
2. Negative head MRA.

## 2020-06-11 IMAGING — MR MR HEAD W/O CM
11 series · 48 of 48 positions shown · non-contrast
Comparison: None.

CLINICAL DATA: Headaches. Transient neurological symptoms.
Left-sided numbness.

EXAM:
MRI HEAD WITHOUT CONTRAST
MRA HEAD WITHOUT CONTRAST
TECHNIQUE: Multiplanar, multiecho pulse sequences of the brain and surrounding
structures were obtained without intravenous contrast. Angiographic
images of the head were obtained using MRA technique without
contrast.

[Series 5: T1 · sagittal · 4.0mm · 0.75mm/px · 3 of 31 slices shown (1 of 2)]
[im 1/31]
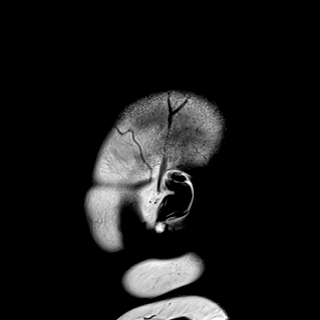
[im 16/31]
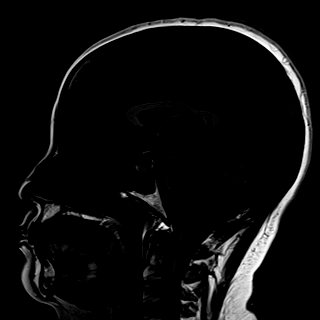
[im 31/31]
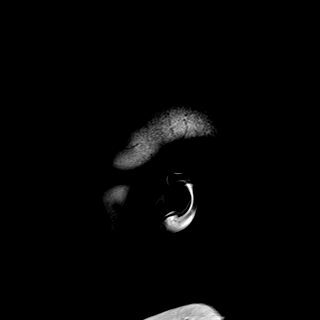

[Series 6: DWI · axial · 3.0mm · 1.44mm/px · z∈[-47,+86]mm · 7 of 84 slices shown (1 of 4)]
[im 1/84]
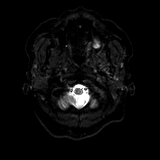
[im 14/84]
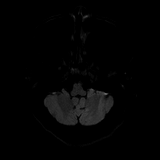
[im 28/84]
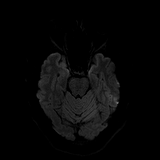
[im 42/84]
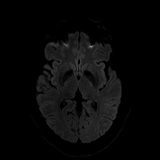
[im 56/84]
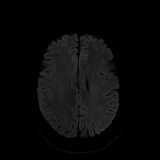
[im 70/84]
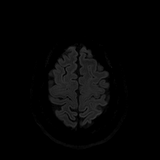
[im 84/84]
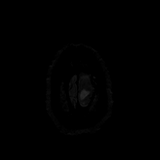

[Series 7: DWI · axial · 3.0mm · 1.44mm/px · z∈[-47,+86]mm · 3 of 42 slices shown (2 of 4)]
[im 1/42]
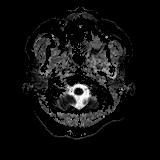
[im 21/42]
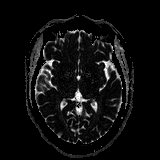
[im 42/42]
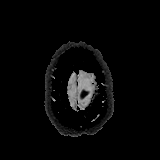

[Series 8: DWI · coronal · 5.0mm · 1.44mm/px · 5 of 60 slices shown (3 of 4)]
[im 1/60]
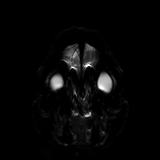
[im 15/60]
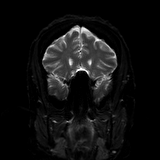
[im 30/60]
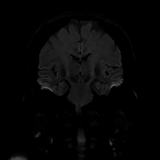
[im 45/60]
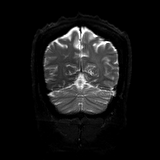
[im 60/60]
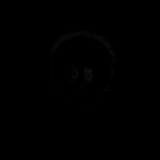

[Series 9: DWI · coronal · 5.0mm · 1.44mm/px · 2 of 30 slices shown (4 of 4)]
[im 1/30]
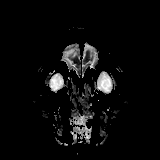
[im 30/30]
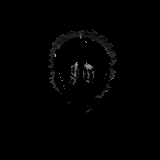

[Series 10: T2 · axial · 4.0mm · 0.36mm/px · z∈[-50,+83]mm · 2 of 27 slices shown]
[im 1/27]
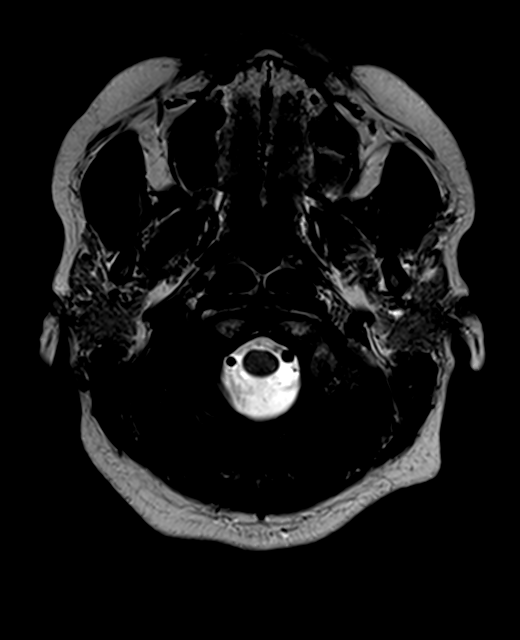
[im 27/27]
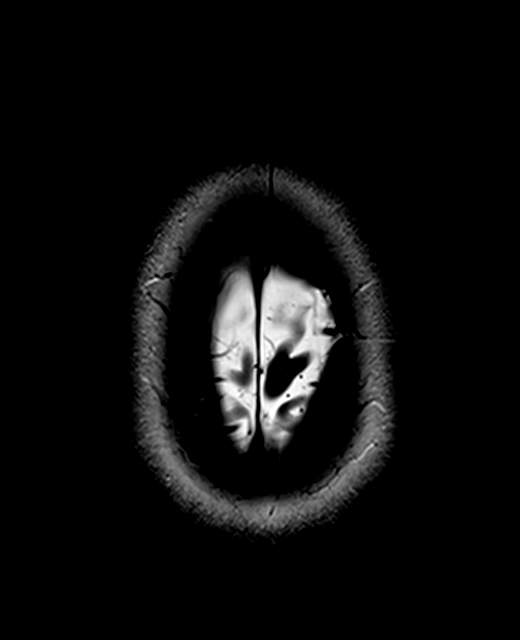

[Series 11: FLAIR · axial · 3.0mm · 0.72mm/px · z∈[-57,+90]mm · 2 of 26 slices shown (1 of 2)]
[im 1/26]
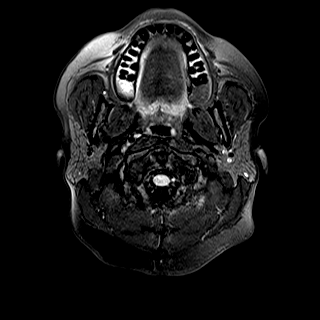
[im 26/26]
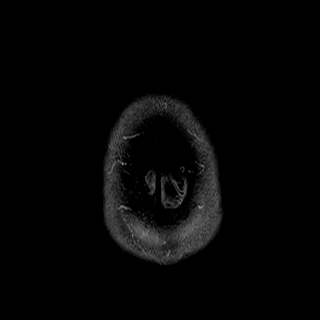

[Series 13: swi_images · axial · 1.5mm · 0.90mm/px · z∈[-54,+87]mm · 7 of 96 slices shown]
[im 1/96]
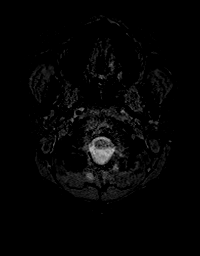
[im 16/96]
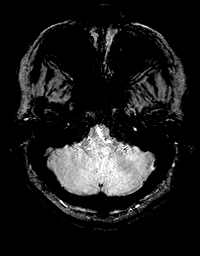
[im 32/96]
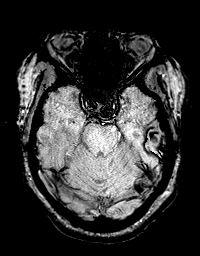
[im 48/96]
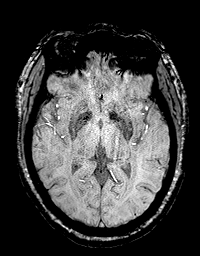
[im 64/96]
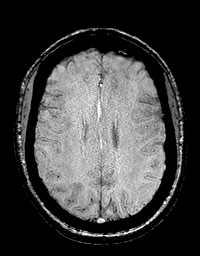
[im 80/96]
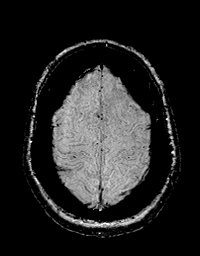
[im 96/96]
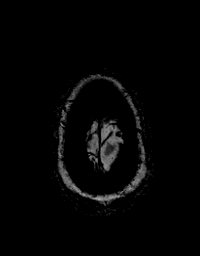

[Series 14: T1 · axial · 1.0mm · 0.94mm/px · z∈[-73,+82]mm · 12 of 160 slices shown (2 of 2)]
[im 1/160]
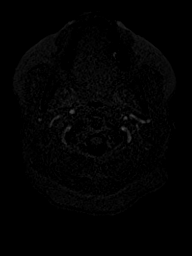
[im 15/160]
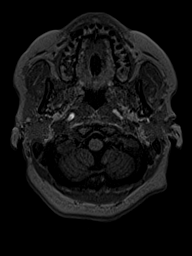
[im 29/160]
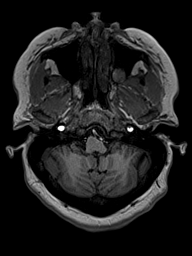
[im 44/160]
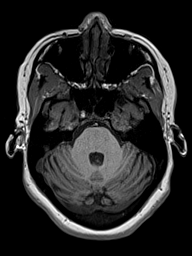
[im 58/160]
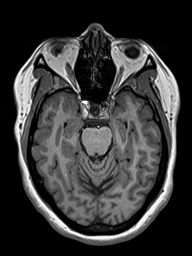
[im 73/160]
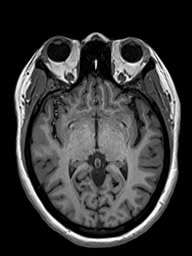
[im 87/160]
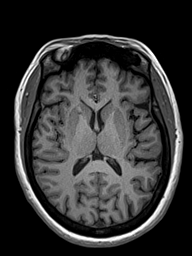
[im 102/160]
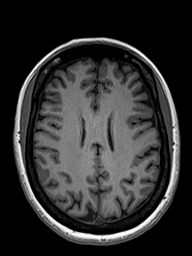
[im 116/160]
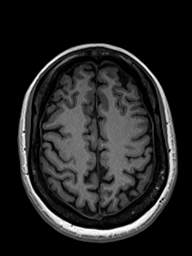
[im 131/160]
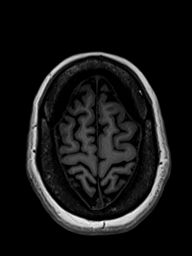
[im 145/160]
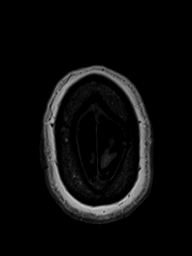
[im 160/160]
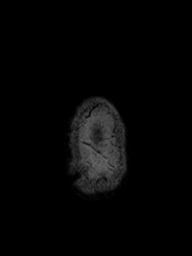

[Series 15: T2 post-contrast · coronal · 4.0mm · 0.36mm/px · 3 of 34 slices shown]
[im 1/34]
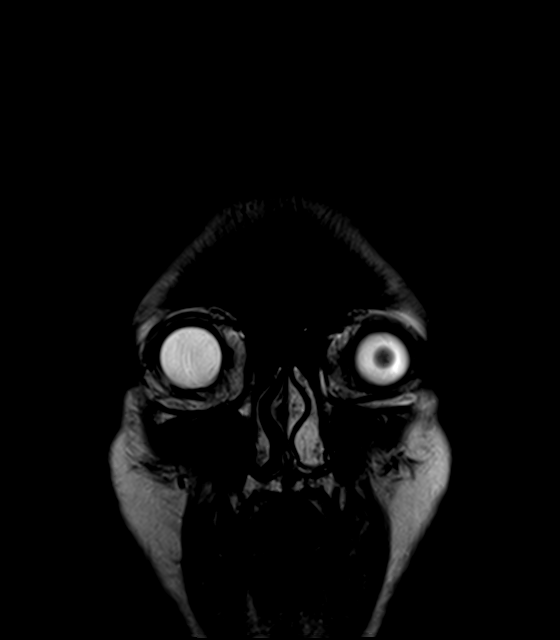
[im 17/34]
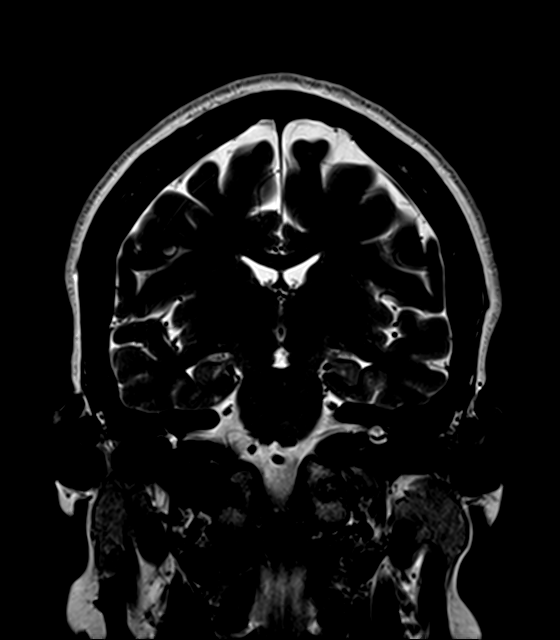
[im 34/34]
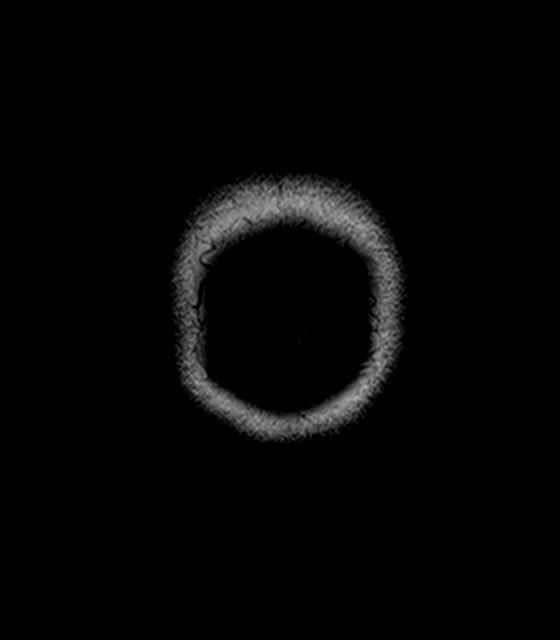

[Series 16: FLAIR · sagittal · 4.0mm · 0.72mm/px · 2 of 26 slices shown (2 of 2)]
[im 1/26]
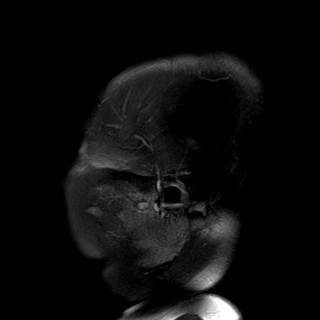
[im 26/26]
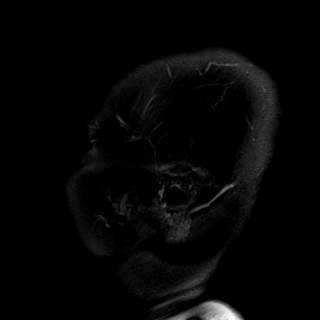

[48 of 48 positions shown; findings below may reference images not displayed]

FINDINGS: MRI HEAD FINDINGS

Multiple attempts at obtaining IV access were unsuccessful, and a
noncontrast examination was performed.

Brain: There is no evidence of acute infarct, intracranial
hemorrhage, mass, midline shift, or extra-axial fluid collection.
The ventricles and sulci are normal. There are at most a few
punctate foci of T2 FLAIR hyperintensity in the cerebral white
matter, not considered abnormal for age. There are no findings to
suggest demyelinating disease.

Vascular: Major intracranial vascular flow voids are preserved.

Skull and upper cervical spine: Unremarkable bone marrow signal.

Sinuses/Orbits: Unremarkable orbits. Small left maxillary sinus
mucous retention cyst. Clear mastoid air cells.

Other: None.

MRA HEAD FINDINGS

The visualized distal vertebral arteries are widely patent to the
basilar and codominant. Patent PICAs, AICAs, and SCAs are seen
bilaterally. The basilar artery is widely patent. Small to medium
sized posterior communicating arteries are present bilaterally. Both
PCAs are patent without evidence of significant stenosis.

The internal carotid arteries are widely patent from skull base to
carotid termini. ACAs and MCAs are patent without evidence of
proximal branch occlusion or significant proximal stenosis. The
anterior communicating artery is unremarkable. No aneurysm is
identified.
IMPRESSION: 1. Negative noncontrast head MRI (unable to obtain IV access).
2. Negative head MRA.

## 2020-10-05 ENCOUNTER — Other Ambulatory Visit: Payer: Self-pay | Admitting: Allergy & Immunology

## 2021-02-21 ENCOUNTER — Other Ambulatory Visit: Payer: Self-pay | Admitting: Allergy & Immunology

## 2021-02-22 NOTE — Telephone Encounter (Signed)
Last seen June of 2021 and was due back in October of 2021

## 2021-05-08 ENCOUNTER — Other Ambulatory Visit: Payer: Self-pay | Admitting: Allergy & Immunology

## 2021-06-22 ENCOUNTER — Other Ambulatory Visit: Payer: Self-pay

## 2021-08-06 ENCOUNTER — Other Ambulatory Visit: Payer: Self-pay | Admitting: Allergy & Immunology

## 2021-10-06 ENCOUNTER — Ambulatory Visit (INDEPENDENT_AMBULATORY_CARE_PROVIDER_SITE_OTHER): Payer: 59 | Admitting: Allergy & Immunology

## 2021-10-06 ENCOUNTER — Other Ambulatory Visit: Payer: Self-pay

## 2021-10-06 VITALS — BP 136/82 | HR 80 | Temp 97.1°F | Ht 64.0 in | Wt 239.2 lb

## 2021-10-06 DIAGNOSIS — J302 Other seasonal allergic rhinitis: Secondary | ICD-10-CM

## 2021-10-06 DIAGNOSIS — J3089 Other allergic rhinitis: Secondary | ICD-10-CM

## 2021-10-06 DIAGNOSIS — J4540 Moderate persistent asthma, uncomplicated: Secondary | ICD-10-CM

## 2021-10-06 DIAGNOSIS — T7800XD Anaphylactic reaction due to unspecified food, subsequent encounter: Secondary | ICD-10-CM

## 2021-10-06 NOTE — Progress Notes (Signed)
FOLLOW UP  Date of Service/Encounter:  10/06/21   Assessment:   Moderate persistent asthma without complication - with adverse reaction to Xolair and Dupixent   Adverse food reactions (peanuts, tree nuts, shrimp, tomato) - with low positives to peanut, tree nuts, tomato   Seasonal and perennial allergic rhinitis (grasses, weeds, trees, mold, roach, and dust mites) - with worsening symptoms as of late including rhinorrhea   Jaclyn Haynes presents for a follow-up visit.  She has been MIA since June 2021.  Her FEV1 looks terrible today.  Its unclear how often she is using her controller medication, as I am not feel that in over a year.  I think she might of had some samples that she has been going through.  We started her on prednisone today and obtained some labs to look for serious difficult to control causes of her asthma.  I just wanted make sure that were not missing something to help her breathe better, such as alpha-1 antitrypsin deficiency or ABPA.  We are providing refills and will consider starting a biologic such as Tezspire, which targets upstream mediators.  Plan/Recommendations:   1. Moderate persistent asthma, uncomplicated - Lung testing looked bad today with an FEV1 of 57%, but it did improve with the albuterol treatment. - We are starting a prednisone burst. - We will send in refills, but we are going to add on Spiriva because it seems that this helped in the past.  - We are also getting some labs to see if there are other serious causes of asthma that we are missing.  - We will be in touch with the results.  - Daily controller medication(s): Symbicort 160/4.58mcg two puffs twice daily + Spiriva 1.42mcg two puffs once daily. - Rescue medications: ProAir 4 puffs every 4-6 hours as needed - Asthma control goals:  * Full participation in all desired activities (may need albuterol before activity) * Albuterol use two time or less a week on average (not counting use with  activity) * Cough interfering with sleep two time or less a month * Oral steroids no more than once a year * No hospitalizations  2. Perennial allergic rhinitis (grasses, weeds, trees, mold, roach, and dust mites) - Continue with Nasacort OTC 1-2 sprays per nostril daily.  - Continue with Xyzal 5mg  daily (you can use one twice daily if needed).  - Continue with Atrovent one spray per nostril up to three times daily (can cause over drying so beware).   3. Adverse food reaction (peanuts, tree nuts, shrimp, tomato, strawberry) - Continue to avoid peanuts, tree nuts, shrimp, and tomato. [name] updated today.  - Anaphylaxis management plan updated.   4. Return in about 3 months (around 01/04/2022).   Subjective:   Jaclyn Haynes is a 51 y.o. female presenting today for follow up of  Chief Complaint  Patient presents with   Asthma    Yearly - soso   Food Allergy    Yearly - going good   Allergic Rhinitis     Yearly - going crazy due to the weather    Jaclyn Haynes has a history of the following: Patient Active Problem List   Diagnosis Date Noted   Angioedema of lips 05/21/2016   Allergic reaction 05/21/2016   Palpitation 08/04/2015   Migraine without aura and without status migrainosus, not intractable 10/27/2014   Dizziness and giddiness 10/27/2013   Disturbance of skin sensation 10/27/2013    History obtained from: chart review and  patient.  Jaclyn Haynes is a 51 y.o. female presenting for a follow up visit.  She was last seen in June 2021.  She was supposed to follow-up in October 2021, but presents now over a year later.  Her lung testing looked good at the last visit.  She did have a blunted expiratory curve.  We decided to send in AirDuo to 32/14 mcg 1 puff twice daily since she was having trouble affording her other controllers.  For her rhinitis, we continue with Nasacort and Xyzal.  We added Atrovent for marked postnasal drip and rhinorrhea.  She continue to avoid all of  her triggering foods.  Since the last visit, she has been having some sinus issues.   Asthma/Respiratory Symptom History: Her asthma has been up and down. Weather is affecting it more than normal, with the temperature changes.  She has to use her rescue inhaler around 3 times per month. She is using Symbicort two puffs twice daily (leftover from samples). She never got the AirDuo approved. Symbicort was $50 and now it is $40 per month. She is unsure whether this is asthma related or not, but sometimes she starts to have some substernal pain and feels herself wheezing and then it goes away. She denies reflux stuff at all. Of note, she is on omeprazole daily and has Pepcid on board as well. She does "cycle on and off" because she has vertigo from it. She will have SOMETHING on board such as Pepcid.   She had breakouts from [place] around the injection site.  Xolair resulted in worsening itching and redness around the injection site.  Allergic Rhinitis Symptom History: She feels that she is getting "white patches" in her throat with painful sensation. She is having a lot of nasal issues. She is getting a headache on a certain side where her glasses hurt.  She is on Nasacort which is about $25 per month. She has been on Flonase but it burns.  She has been on Nasonex and Rhinocort as well, but is unsure of the reactions.  She does have Atrovent as well.  Food Allergy Symptom History: She continues to avoid all of her triggering foods.  She did have an episode where she ate something that contained macadamia nuts. She spit it out and was itching. She did not realize that she had nuts in it.  Her EpiPen is likely not up-to-date.  Otherwise, there have been no changes to her past medical history, surgical history, family history, or social history.    Review of Systems  Constitutional: Negative.  Negative for chills, fever, malaise/fatigue and weight loss.  HENT: Negative.  Negative for congestion, ear  discharge, ear pain and sinus pain.   Eyes:  Negative for pain, discharge and redness.  Respiratory:  Negative for cough, sputum production, shortness of breath and wheezing.   Cardiovascular: Negative.  Negative for chest pain and palpitations.  Gastrointestinal:  Negative for abdominal pain, constipation, diarrhea, heartburn, nausea and vomiting.  Skin: Negative.  Negative for itching and rash.  Neurological:  Negative for dizziness and headaches.  Endo/Heme/Allergies:  Negative for environmental allergies. Does not bruise/bleed easily.      Objective:   Blood pressure 136/82, pulse 80, temperature (!) 97.1 F (36.2 C), height 5\' 4"  (1.626 m), weight 239 lb 3.2 oz (108.5 kg), SpO2 98 %. Body mass index is 41.06 kg/m.   Physical Exam:  Physical Exam Vitals reviewed.  Constitutional:      Appearance: Normal appearance. She is well-developed.  Comments: Smiling as always.  HENT:     Head: Normocephalic and atraumatic.     Right Ear: Tympanic membrane, ear canal and external ear normal.     Left Ear: Tympanic membrane, ear canal and external ear normal.     Nose: No rhinorrhea.     Right Turbinates: Enlarged, swollen and pale.     Left Turbinates: Enlarged, swollen and pale.     Comments: Cobblestoning present in the posterior oropharynx.     Mouth/Throat:     Lips: Pink. No lesions.     Mouth: Mucous membranes are moist. Mucous membranes are not dry.  Eyes:     General: Lids are normal.        Right eye: No discharge.        Left eye: No discharge.     Conjunctiva/sclera: Conjunctivae normal.     Right eye: Right conjunctiva is not injected. No chemosis.    Left eye: Left conjunctiva is not injected. No chemosis.    Pupils: Pupils are equal, round, and reactive to light.  Cardiovascular:     Rate and Rhythm: Normal rate and regular rhythm.     Heart sounds: Normal heart sounds.  Pulmonary:     Effort: Pulmonary effort is normal. Tachypnea present. No accessory  muscle usage or respiratory distress.     Breath sounds: Normal breath sounds. No wheezing, rhonchi or rales.     Comments: Decreased air movement at the bases. Chest:     Chest wall: No tenderness.  Abdominal:     Tenderness: There is no abdominal tenderness. There is no guarding or rebound.  Lymphadenopathy:     Head:     Right side of head: No submandibular, tonsillar or occipital adenopathy.     Left side of head: No submandibular, tonsillar or occipital adenopathy.     Cervical: No cervical adenopathy.  Skin:    Coloration: Skin is not pale.     Findings: No abrasion, erythema, petechiae or rash. Rash is not papular, urticarial or vesicular.     Comments: No eczematous and urticarial lesions noted.   Neurological:     Mental Status: She is alert.  Psychiatric:        Behavior: Behavior is cooperative.     Diagnostic studies:    Spirometry: results abnormal (FEV1: 1.35/57%, FVC: 2.05/70%, FEV1/FVC: 66%).    Spirometry consistent with mixed obstructive and restrictive disease. Albuterol four puffs via MDI treatment given in clinic with significant improvement in FEV1 and FVC per ATS criteria.  Allergy Studies: none        Salvatore Marvel, MD  [facility]

## 2021-10-06 NOTE — Patient Instructions (Addendum)
1. Moderate persistent asthma, uncomplicated - Lung testing looked bad today with an FEV1 of 57%, but it did improve with the albuterol treatment. - We are starting a prednisone burst. - We will send in refills, but we are going to add on Spiriva because it seems that this helped in the past.  - We are also getting some labs to see if there are other serious causes of asthma that we are missing.  - We will be in touch with the results.  - Daily controller medication(s): Symbicort 160/4.51mcg two puffs twice daily + Spiriva 1.48mcg two puffs once daily. - Rescue medications: ProAir 4 puffs every 4-6 hours as needed - Asthma control goals:  * Full participation in all desired activities (may need albuterol before activity) * Albuterol use two time or less a week on average (not counting use with activity) * Cough interfering with sleep two time or less a month * Oral steroids no more than once a year * No hospitalizations  2. Perennial allergic rhinitis (grasses, weeds, trees, mold, roach, and dust mites) - Continue with Nasacort OTC 1-2 sprays per nostril daily.  - Continue with Xyzal 5mg  daily (you can use one twice daily if needed).  - Continue with Atrovent one spray per nostril up to three times daily (can cause over drying so beware).   3. Adverse food reaction (peanuts, tree nuts, shrimp, tomato, strawberry) - Continue to avoid peanuts, tree nuts, shrimp, and tomato. Jaclyn Haynes updated today.  - Anaphylaxis management plan updated.   4. Return in about 3 months (around 01/04/2022).    Please inform us of any Emergency Department visits, hospitalizations, or changes in symptoms. Call us before going to the ED for breathing or allergy symptoms since we might be able to fit you in for a sick visit. Feel free to contact us anytime with any questions, problems, or concerns.  It was a pleasure to see you again today!  Websites that have reliable patient information: 1. American Academy of  Asthma, Allergy, and Immunology: www.aaaai.org 2. Food Allergy Research and Education (FARE): foodallergy.org 3. Mothers of Asthmatics: http://www.asthmacommunitynetwork.org 4. American College of Allergy, Asthma, and Immunology: www.acaai.org   COVID-19 Vaccine Information can be found at: ShippingScam.co.uk For questions related to vaccine distribution or appointments, please email [email or web address] or call [phone].     Like [place] on [place] and Instagram for our latest updates!        Make sure you are registered to vote! If you have moved or changed any of your contact information, you will need to get this updated before voting!  In some cases, you MAY be able to register to vote online: [email or web address]

## 2021-10-10 LAB — CBC WITH DIFFERENTIAL/PLATELET
Basophils Absolute: 0 10*3/uL (ref 0.0–0.2)
Basos: 0 %
EOS (ABSOLUTE): 0.2 10*3/uL (ref 0.0–0.4)
Eos: 2 %
Hematocrit: 36.7 % (ref 34.0–46.6)
Hemoglobin: 11.6 g/dL (ref 11.1–15.9)
Immature Grans (Abs): 0 10*3/uL (ref 0.0–0.1)
Immature Granulocytes: 0 %
Lymphocytes Absolute: 3.1 10*3/uL (ref 0.7–3.1)
Lymphs: 39 %
MCH: 27 pg (ref 26.6–33.0)
MCHC: 31.6 g/dL (ref 31.5–35.7)
MCV: 86 fL (ref 79–97)
Monocytes Absolute: 0.8 10*3/uL (ref 0.1–0.9)
Monocytes: 10 %
Neutrophils Absolute: 3.8 10*3/uL (ref 1.4–7.0)
Neutrophils: 49 %
Platelets: 296 10*3/uL (ref 150–450)
RBC: 4.29 x10E6/uL (ref 3.77–5.28)
RDW: 13.2 % (ref 11.7–15.4)
WBC: 7.8 10*3/uL (ref 3.4–10.8)

## 2021-10-10 LAB — ANCA TITERS
Atypical pANCA: <1:20 {titer}
C-ANCA: <1:20 {titer}
P-ANCA: <1:20 {titer}

## 2021-10-10 LAB — ASPERGILLUS PRECIPITINS
A.Fumigatus #1 Abs: NEGATIVE
Aspergillus Flavus Antibodies: NEGATIVE
Aspergillus Niger Antibodies: NEGATIVE
Aspergillus glaucus IgG: NEGATIVE
Aspergillus nidulans IgG: NEGATIVE
Aspergillus terreus IgG: NEGATIVE

## 2021-10-10 LAB — ALPHA-1-ANTITRYPSIN: A-1 Antitrypsin: 154 mg/dL (ref 101–187)

## 2021-10-11 ENCOUNTER — Telehealth: Payer: Self-pay | Admitting: Allergy & Immunology

## 2021-10-11 ENCOUNTER — Other Ambulatory Visit: Payer: Self-pay | Admitting: *Deleted

## 2021-10-11 ENCOUNTER — Encounter: Payer: Self-pay | Admitting: Allergy & Immunology

## 2021-10-11 MED ORDER — SYMBICORT 160-4.5 MCG/ACT IN AERO: 2.0000 | INHALATION_SPRAY | Freq: Two times a day (BID) | RESPIRATORY_TRACT | 5 refills | Status: DC

## 2021-10-11 MED ORDER — SPIRIVA RESPIMAT 1.25 MCG/ACT IN AERS: 2.0000 | INHALATION_SPRAY | Freq: Every day | RESPIRATORY_TRACT | 5 refills | Status: DC

## 2021-10-11 MED ORDER — ALBUTEROL SULFATE HFA 108 (90 BASE) MCG/ACT IN AERS: 2.0000 | INHALATION_SPRAY | Freq: Four times a day (QID) | RESPIRATORY_TRACT | 1 refills | Status: DC | PRN

## 2021-10-11 NOTE — Telephone Encounter (Signed)
Patient called and said that her rx were not called in. She would like for the Symbicort, Spiriva, pro air called into [facility] on [address] . [phone].

## 2021-10-11 NOTE — Telephone Encounter (Signed)
Refills have been sent in. Called and left a voicemail asking for return call to inform patient.

## 2021-10-12 NOTE — Telephone Encounter (Signed)
Called and advised patient of medications being sent in. Patient verbalized understanding.

## 2021-10-19 ENCOUNTER — Other Ambulatory Visit: Payer: Self-pay | Admitting: *Deleted

## 2021-10-19 ENCOUNTER — Telehealth: Payer: Self-pay | Admitting: Allergy & Immunology

## 2021-10-19 MED ORDER — EPINEPHRINE 0.3 MG/0.3ML IJ SOAJ: 0.3000 mg | Freq: Once | INTRAMUSCULAR | 1 refills | Status: AC

## 2021-10-19 NOTE — Telephone Encounter (Signed)
EpiPen has been sent in to requested pharmacy. Called and left a detailed voicemail advising patient per [facility] permission.

## 2021-10-19 NOTE — Telephone Encounter (Signed)
Cerys called in and states she was supposed to receive an Epi-pen and one was never called in.  She would like an Epi-pen called in to [place], [place]

## 2021-10-21 ENCOUNTER — Telehealth: Payer: Self-pay | Admitting: Allergy & Immunology

## 2021-10-21 MED ORDER — NIRMATRELVIR/RITONAVIR (PAXLOVID)TABLET: 3.0000 | ORAL_TABLET | Freq: Two times a day (BID) | ORAL | 0 refills | Status: AC

## 2021-10-21 MED ORDER — PREDNISONE 10 MG PO TABS: ORAL_TABLET | ORAL | 0 refills | Status: DC

## 2021-10-21 NOTE — Telephone Encounter (Signed)
Patient called to let Dr. Ernst Bowler know she tested positive for covid, yesterday. Patient has had a cough, chest congestion, sore throat, nasal drip and rattling in her chest. Patient states her chest hurts when she coughs.   Patient would like to know if Dr. Ernst Bowler would like her to only use her inhalers or would he like to send something in for her.   [facility] - [address] [place] [address]  Best contact number: [phone]

## 2021-10-21 NOTE — Telephone Encounter (Signed)
I called the patient and she stated her symptoms started Tuesday night and she tested positive for covid 10/20/21. She has been having a lot of coughing, difficulty breathing due to coughing, runny nose, congestion, nasal cavities get clogged, headaches. Took nyquil and ibuprofen last night.  She has been using her inhalers. Please advise on mediations

## 2021-10-21 NOTE — Telephone Encounter (Signed)
I sent in a script for Paxlovid as well as prednisone. Please let the patient know.  Does she have a pulse ox at home? Please ask her to monitor that occasionally throughout the day.   Paxlovid will increase metabolism of her OCP. If she is still menstruating, she should use an alternative form of contraception.   Salvatore Marvel, MD [facility]

## 2021-10-21 NOTE — Telephone Encounter (Signed)
Spoke with patient, informed her of Dr. Gillermina Hu recommendation. Patient verbalized understanding. She does not have a pulse ox however when she picks up her medication she will get one.

## 2022-01-05 ENCOUNTER — Ambulatory Visit: Payer: 59 | Admitting: Allergy & Immunology

## 2022-01-10 ENCOUNTER — Ambulatory Visit: Payer: 59 | Admitting: Allergy & Immunology

## 2022-01-26 ENCOUNTER — Ambulatory Visit: Payer: 59 | Admitting: Allergy & Immunology

## 2022-01-26 ENCOUNTER — Other Ambulatory Visit: Payer: Self-pay

## 2022-01-26 ENCOUNTER — Encounter: Payer: Self-pay | Admitting: Allergy & Immunology

## 2022-01-26 VITALS — BP 138/82 | HR 84 | Temp 98.0°F | Resp 18

## 2022-01-26 DIAGNOSIS — J3089 Other allergic rhinitis: Secondary | ICD-10-CM | POA: Diagnosis not present

## 2022-01-26 DIAGNOSIS — J302 Other seasonal allergic rhinitis: Secondary | ICD-10-CM

## 2022-01-26 DIAGNOSIS — J4540 Moderate persistent asthma, uncomplicated: Secondary | ICD-10-CM

## 2022-01-26 DIAGNOSIS — T7800XD Anaphylactic reaction due to unspecified food, subsequent encounter: Secondary | ICD-10-CM

## 2022-01-26 NOTE — Progress Notes (Signed)
? ?FOLLOW UP ? ?Date of Service/Encounter:  01/26/22 ? ? ?Assessment:  ? ?Moderate persistent asthma without complication - with adverse reaction to Xolair and Dupixent ?  ?Adverse food reactions (peanuts, tree nuts, shrimp, tomato) - with low positives to peanut, tree nuts, tomato ?  ?Seasonal and perennial allergic rhinitis (grasses, weeds, trees, mold, roach, and dust mites) - with worsening symptoms as of late including rhinorrhea ? ?Plan/Recommendations:  ? ?1. Moderate persistent asthma, uncomplicated ?- Lung testing looked better. ?- Sample of Breztri provided (this will REPLACE the Symbicort and the Spiriva). ?- Daily controller medication(s): Breztri two puffs twice daily with spacer ?- Rescue medications: ProAir 4 puffs every 4-6 hours as needed ?- Asthma control goals:  ?* Full participation in all desired activities (may need albuterol before activity) ?* Albuterol use two time or less a week on average (not counting use with activity) ?* Cough interfering with sleep two time or less a month ?* Oral steroids no more than once a year ?* No hospitalizations ? ?2. Perennial allergic rhinitis (grasses, weeds, trees, mold, roach, and dust mites) ?- Continue with Nasacort OTC 1-2 sprays per nostril daily AS NEEDED. ?- Continue with Xyzal '5mg'$  daily (you can use one twice daily if needed).  ?- Continue with Atrovent one spray per nostril up to three times daily AS NEEDED. ? ?3. Adverse food reaction (peanuts, tree nuts, shrimp, tomato, strawberry) ?- Continue to avoid peanuts, tree nuts, shrimp, and tomato. ?- AuviQ updated today.  ?- Anaphylaxis management plan updated.  ? ?4. Return in about 3 months (around 04/28/2022).  ? ? ?Subjective:  ? ?Jaclyn Haynes is a 51 y.o. female presenting today for follow up of  ?Chief Complaint  ?Patient presents with  ? Asthma  ?  Been doing better has only used her rescue inhaler 3 times since last visit  ? ? ?Jaclyn Haynes has a history of the following: ?Patient  Active Problem List  ? Diagnosis Date Noted  ? Angioedema of lips 05/21/2016  ? Allergic reaction 05/21/2016  ? Palpitation 08/04/2015  ? Migraine without aura and without status migrainosus, not intractable 10/27/2014  ? Dizziness and giddiness 10/27/2013  ? Disturbance of skin sensation 10/27/2013  ? ? ?History obtained from: chart review and patient. ? ?Jaclyn Haynes is a 51 y.o. female presenting for a follow up visit.  She was last seen in January 2023.  At that time, I had not seen her in over 18 months.  Her FEV looked awful.  We started her on prednisone and added on Spiriva to her Symbicort.  We also got labs to see if she will qualify for an injectable asthma medication.  For her rhinitis, we continue with Nasacort as well as Xyzal and Atrovent.  She continued to avoid all of her triggering foods. ? ?In the interim, she was diagnosed with COVID-19.  We sent him Paxlovid in January 2023. ? ?Since last visit, she has done well.  ? ?Asthma/Respiratory Symptom History: She has been using her rescue inhaler intermittently. She has only used her rescue inhaler 3 times in the last month.  She is on the Symbicort two puffs twice daily. She has been doing the Spiriva because of tongue numbing. She has to space that out to balance the side effects versus the efficacy. She is happy to hear that her labs were normal at the last visit.  ? ?Allergic Rhinitis Symptom History: She is on the Xyzal daily and the nasal sprays as needed. She  never really felt different with Singulair at all so she kept this off.  She feels that her symptoms are under fair control. She was on allergy shots for a hot second, but her insurance demanded on covering them as an office visit, necessitating a $50 co-pay each time.  Therefore she stopped doing them.  She only got into a blue vial. ? ?Food Allergy Symptom History: She continues to avoid all of her triggering foods.  Epinephrine autoinjector is up-to-date. ? ?Otherwise, there have been no  changes to her past medical history, surgical history, family history, or social history. ? ? ? ?Review of Systems  ?Constitutional: Negative.  Negative for chills, fever, malaise/fatigue and weight loss.  ?HENT: Negative.  Negative for congestion, ear discharge and ear pain.   ?Eyes:  Negative for pain, discharge and redness.  ?Respiratory:  Negative for cough, sputum production, shortness of breath and wheezing.   ?Cardiovascular: Negative.  Negative for chest pain and palpitations.  ?Gastrointestinal:  Negative for abdominal pain, constipation, diarrhea, heartburn, nausea and vomiting.  ?Skin: Negative.  Negative for itching and rash.  ?Neurological:  Negative for dizziness and headaches.  ?Endo/Heme/Allergies:  Negative for environmental allergies. Does not bruise/bleed easily.   ? ? ? ?Objective:  ? ?Blood pressure 138/82, pulse 84, temperature 98 ?F (36.7 ?C), resp. rate 18, SpO2 98 %. ?There is no height or weight on file to calculate BMI. ? ? ? ?Physical Exam ?Vitals reviewed.  ?Constitutional:   ?   Appearance: Normal appearance. She is well-developed.  ?   Comments: Smiling as always.  Very pleasant.  Adorable.  ?HENT:  ?   Head: Normocephalic and atraumatic.  ?   Right Ear: Tympanic membrane, ear canal and external ear normal.  ?   Left Ear: Tympanic membrane, ear canal and external ear normal.  ?   Nose: No rhinorrhea.  ?   Right Turbinates: Enlarged, swollen and pale.  ?   Left Turbinates: Enlarged, swollen and pale.  ?   Comments: Minimal cobblestoning. ?   Mouth/Throat:  ?   Lips: Pink. No lesions.  ?   Mouth: Mucous membranes are moist. Mucous membranes are not dry.  ?Eyes:  ?   General: Lids are normal.     ?   Right eye: No discharge.     ?   Left eye: No discharge.  ?   Conjunctiva/sclera: Conjunctivae normal.  ?   Right eye: Right conjunctiva is not injected. No chemosis. ?   Left eye: Left conjunctiva is not injected. No chemosis. ?   Pupils: Pupils are equal, round, and reactive to light.   ?Cardiovascular:  ?   Rate and Rhythm: Normal rate and regular rhythm.  ?   Heart sounds: Normal heart sounds.  ?Pulmonary:  ?   Effort: Pulmonary effort is normal. No tachypnea, accessory muscle usage or respiratory distress.  ?   Breath sounds: Normal breath sounds. No wheezing, rhonchi or rales.  ?   Comments: Moving air well in all lung fields.  Much better air movement than before. ?Chest:  ?   Chest wall: No tenderness.  ?Abdominal:  ?   Tenderness: There is no abdominal tenderness. There is no guarding or rebound.  ?Lymphadenopathy:  ?   Head:  ?   Right side of head: No submandibular, tonsillar or occipital adenopathy.  ?   Left side of head: No submandibular, tonsillar or occipital adenopathy.  ?   Cervical: No cervical adenopathy.  ?Skin: ?  General: Skin is warm.  ?   Capillary Refill: Capillary refill takes less than 2 seconds.  ?   Coloration: Skin is not pale.  ?   Findings: No abrasion, erythema, petechiae or rash. Rash is not papular, urticarial or vesicular.  ?   Comments: No eczematous and urticarial lesions noted.   ?Neurological:  ?   Mental Status: She is alert.  ?Psychiatric:     ?   Behavior: Behavior is cooperative.  ?  ? ?Diagnostic studies:   ? ?Spirometry: results abnormal (FEV1: 1.60/68%, FVC: 2.32/79%, FEV1/FVC: 69%).  ?  ?Spirometry consistent with moderate obstructive disease.  Overall, this is much better than before.  She has never been completely normal. ? ?Allergy Studies: none ? ? ? ? ? ?  ?Salvatore Marvel, MD  ?[facility] ? ? ? ? ? ? ?

## 2022-01-26 NOTE — Patient Instructions (Addendum)
1. Moderate persistent asthma, uncomplicated ?- Lung testing looked better. ?- Sample of Breztri provided (this will REPLACE the Symbicort and the Spiriva). ?- Daily controller medication(s): Breztri two puffs twice daily with spacer ?- Rescue medications: ProAir 4 puffs every 4-6 hours as needed ?- Asthma control goals:  ?* Full participation in all desired activities (may need albuterol before activity) ?* Albuterol use two time or less a week on average (not counting use with activity) ?* Cough interfering with sleep two time or less a month ?* Oral steroids no more than once a year ?* No hospitalizations ? ?2. Perennial allergic rhinitis (grasses, weeds, trees, mold, roach, and dust mites) ?- Continue with Nasacort OTC 1-2 sprays per nostril daily AS NEEDED. ?- Continue with Xyzal '5mg'$  daily (you can use one twice daily if needed).  ?- Continue with Atrovent one spray per nostril up to three times daily AS NEEDED. ? ?3. Adverse food reaction (peanuts, tree nuts, shrimp, tomato, strawberry) ?- Continue to avoid peanuts, tree nuts, shrimp, and tomato. ?- AuviQ updated today.  ?- Anaphylaxis management plan updated.  ? ?4. Return in about 3 months (around 04/28/2022).  ? ? ?Please inform us of any Emergency Department visits, hospitalizations, or changes in symptoms. Call us before going to the ED for breathing or allergy symptoms since we might be able to fit you in for a sick visit. Feel free to contact us anytime with any questions, problems, or concerns. ? ?It was a pleasure to see you again today! ? ?Websites that have reliable patient information: ?1. American Academy of Asthma, Allergy, and Immunology: www.aaaai.org ?2. Food Allergy Research and Education (FARE): foodallergy.org ?3. Mothers of Asthmatics: http://www.asthmacommunitynetwork.org ?4. [employer] of Allergy, Asthma, and Immunology: MonthlyElectricBill.co.uk ? ? ?COVID-19 Vaccine Information can be found at:  ShippingScam.co.uk For questions related to vaccine distribution or appointments, please email [email or web address] or call [phone].  ? ? ? ??Like? [place] on Facebook and Instagram for our latest updates!  ?  ? ? ? ? ?Make sure you are registered to vote! If you have moved or changed any of your contact information, you will need to get this updated before voting! ? ?In some cases, you MAY be able to register to vote online: [email or web address] ? ? ? ? ?

## 2022-07-10 ENCOUNTER — Other Ambulatory Visit: Payer: Self-pay | Admitting: Obstetrics and Gynecology

## 2022-09-13 ENCOUNTER — Other Ambulatory Visit: Payer: Self-pay | Admitting: Obstetrics and Gynecology

## 2022-11-14 ENCOUNTER — Encounter (HOSPITAL_BASED_OUTPATIENT_CLINIC_OR_DEPARTMENT_OTHER): Payer: Self-pay | Admitting: Obstetrics and Gynecology

## 2022-11-14 NOTE — Progress Notes (Signed)
Spoke w/ via phone for pre-op interview---[facility] needs dos----  UPT and CBC per surgeon, ISTAT and EKG per anesthesia             Lab results------ COVID test -----patient states asymptomatic no test needed Arrive at -------1200 NPO after MN NO Solid Food.  Clear liquids from MN until---1100 Med rec completed Medications to take morning of surgery -----Coreg, Protonix, inhalers and bring Albuterol inhaler Diabetic medication ----- Patient instructed no nail polish to be worn day of surgery Patient instructed to bring photo id and insurance card day of surgery Patient aware to have Driver (ride ) / caregiver Mickle Asper   for 24 hours after surgery  Patient Special Instructions ----- Pre-Op special Istructions ----- Patient verbalized understanding of instructions that were given at this phone interview. Patient denies shortness of breath, chest pain, fever, cough at this phone interview.

## 2022-11-17 ENCOUNTER — Encounter (HOSPITAL_COMMUNITY): Payer: Self-pay | Admitting: Obstetrics and Gynecology

## 2022-11-17 ENCOUNTER — Other Ambulatory Visit: Payer: Self-pay

## 2022-11-17 NOTE — Progress Notes (Signed)
Ms Fehlman denies chest pain or shortness of breath.  Patient denies having any s/s of Covid in her household, also denies any known exposure to Covid.   Ms. Woolford PCP is Lennie Odor, PA-C with [facility]. Patient reports that she has not had an EKG in the past year.  Dr. Weyman Croon is Ms. Hey's Asthma and allergist.

## 2022-11-20 ENCOUNTER — Ambulatory Visit (HOSPITAL_BASED_OUTPATIENT_CLINIC_OR_DEPARTMENT_OTHER): Payer: 59 | Admitting: Registered Nurse

## 2022-11-20 ENCOUNTER — Encounter (HOSPITAL_COMMUNITY)
Admission: RE | Disposition: A | Discharge status: Home / Self Care | Payer: Self-pay | Source: Home / Self Care | Attending: Obstetrics and Gynecology

## 2022-11-20 ENCOUNTER — Other Ambulatory Visit: Payer: Self-pay

## 2022-11-20 ENCOUNTER — Encounter (HOSPITAL_COMMUNITY): Payer: Self-pay | Admitting: Obstetrics and Gynecology

## 2022-11-20 ENCOUNTER — Ambulatory Visit (HOSPITAL_COMMUNITY): Payer: 59 | Admitting: Registered Nurse

## 2022-11-20 ENCOUNTER — Ambulatory Visit (HOSPITAL_COMMUNITY)
Admission: RE | Admit: 2022-11-20 | Discharge: 2022-11-20 | Disposition: A | Discharge status: Home / Self Care | Payer: 59 | Attending: Obstetrics and Gynecology | Admitting: Obstetrics and Gynecology

## 2022-11-20 DIAGNOSIS — N840 Polyp of corpus uteri: Secondary | ICD-10-CM

## 2022-11-20 DIAGNOSIS — D259 Leiomyoma of uterus, unspecified: Secondary | ICD-10-CM

## 2022-11-20 DIAGNOSIS — K219 Gastro-esophageal reflux disease without esophagitis: Secondary | ICD-10-CM | POA: Diagnosis not present

## 2022-11-20 DIAGNOSIS — I10 Essential (primary) hypertension: Secondary | ICD-10-CM | POA: Insufficient documentation

## 2022-11-20 HISTORY — DX: Essential (primary) hypertension: I10

## 2022-11-20 HISTORY — PX: DILATATION & CURETTAGE/HYSTEROSCOPY WITH MYOSURE: SHX6511

## 2022-11-20 HISTORY — PX: DILITATION & CURRETTAGE/HYSTROSCOPY WITH HYDROTHERMAL ABLATION: SHX5570

## 2022-11-20 LAB — BASIC METABOLIC PANEL
Anion gap: 8 (ref 5–15)
BUN: 9 mg/dL (ref 6–20)
CO2: 24 mmol/L (ref 22–32)
Calcium: 8.4 mg/dL — ABNORMAL LOW (ref 8.9–10.3)
Chloride: 105 mmol/L (ref 98–111)
Creatinine, Ser: 0.78 mg/dL (ref 0.44–1.00)
GFR, Estimated: >60 mL/min (ref >60)
Glucose, Bld: 103 mg/dL — ABNORMAL HIGH (ref 70–99)
Potassium: 3.8 mmol/L (ref 3.5–5.1)
Sodium: 137 mmol/L (ref 135–145)

## 2022-11-20 LAB — CBC
HCT: 37.9 % (ref 36.0–46.0)
Hemoglobin: 12.3 g/dL (ref 12.0–15.0)
MCH: 29.4 pg (ref 26.0–34.0)
MCHC: 32.5 g/dL (ref 30.0–36.0)
MCV: 90.7 fL (ref 80.0–100.0)
Platelets: 258 10*3/uL (ref 150–400)
RBC: 4.18 MIL/uL (ref 3.87–5.11)
RDW: 13.2 % (ref 11.5–15.5)
WBC: 8.5 10*3/uL (ref 4.0–10.5)
nRBC: 0 % (ref 0.0–0.2)

## 2022-11-20 LAB — POCT PREGNANCY, URINE: Preg Test, Ur: NEGATIVE

## 2022-11-20 SURGERY — DILATATION & CURETTAGE/HYSTEROSCOPY WITH HYDROTHERMAL ABLATION
Anesthesia: General | Site: Vagina

## 2022-11-20 MED ORDER — ONDANSETRON HCL 4 MG/2ML IJ SOLN: 4.0000 mg | Freq: Once | INTRAMUSCULAR | Status: DC | PRN

## 2022-11-20 MED ORDER — GLYCOPYRROLATE PF 0.2 MG/ML IJ SOSY
PREFILLED_SYRINGE | INTRAMUSCULAR | Status: DC | PRN
  Administered 2022-11-20: .1 mg via INTRAVENOUS

## 2022-11-20 MED ORDER — KETOROLAC TROMETHAMINE 30 MG/ML IJ SOLN
INTRAMUSCULAR | Status: AC
  Filled 2022-11-20: qty 1

## 2022-11-20 MED ORDER — DEXAMETHASONE SODIUM PHOSPHATE 10 MG/ML IJ SOLN
INTRAMUSCULAR | Status: AC
  Filled 2022-11-20: qty 1

## 2022-11-20 MED ORDER — ACETAMINOPHEN 10 MG/ML IV SOLN: 1000.0000 mg | Freq: Once | INTRAVENOUS | Status: DC | PRN

## 2022-11-20 MED ORDER — SCOPOLAMINE 1 MG/3DAYS TD PT72
MEDICATED_PATCH | TRANSDERMAL | Status: AC
  Administered 2022-11-20: 1.5 mg via TRANSDERMAL
  Filled 2022-11-20: qty 1

## 2022-11-20 MED ORDER — CHLORHEXIDINE GLUCONATE 0.12 % MT SOLN
15.0000 mL | Freq: Once | OROMUCOSAL | Status: AC
  Administered 2022-11-20: 15 mL via OROMUCOSAL
  Filled 2022-11-20: qty 15

## 2022-11-20 MED ORDER — MIDAZOLAM HCL 2 MG/2ML IJ SOLN
INTRAMUSCULAR | Status: DC | PRN
  Administered 2022-11-20: 2 mg via INTRAVENOUS

## 2022-11-20 MED ORDER — DEXAMETHASONE SODIUM PHOSPHATE 10 MG/ML IJ SOLN
INTRAMUSCULAR | Status: DC | PRN
  Administered 2022-11-20: 10 mg via INTRAVENOUS

## 2022-11-20 MED ORDER — PHENYLEPHRINE 80 MCG/ML (10ML) SYRINGE FOR IV PUSH (FOR BLOOD PRESSURE SUPPORT)
PREFILLED_SYRINGE | INTRAVENOUS | Status: DC | PRN
  Administered 2022-11-20: 160 ug via INTRAVENOUS
  Administered 2022-11-20 (×2): 80 ug via INTRAVENOUS
  Administered 2022-11-20 (×3): 160 ug via INTRAVENOUS

## 2022-11-20 MED ORDER — ONDANSETRON HCL 4 MG/2ML IJ SOLN
INTRAMUSCULAR | Status: DC | PRN
  Administered 2022-11-20: 4 mg via INTRAVENOUS

## 2022-11-20 MED ORDER — SCOPOLAMINE 1 MG/3DAYS TD PT72: 1.0000 | MEDICATED_PATCH | Freq: Once | TRANSDERMAL | Status: DC

## 2022-11-20 MED ORDER — LIDOCAINE 2% (20 MG/ML) 5 ML SYRINGE
INTRAMUSCULAR | Status: DC | PRN
  Administered 2022-11-20: 100 mg via INTRAVENOUS

## 2022-11-20 MED ORDER — ORAL CARE MOUTH RINSE: 15.0000 mL | Freq: Once | OROMUCOSAL | Status: AC

## 2022-11-20 MED ORDER — EPHEDRINE SULFATE-NACL 50-0.9 MG/10ML-% IV SOSY
PREFILLED_SYRINGE | INTRAVENOUS | Status: DC | PRN
  Administered 2022-11-20: 5 mg via INTRAVENOUS

## 2022-11-20 MED ORDER — OXYCODONE-ACETAMINOPHEN 5-325 MG PO TABS: 1.0000 | ORAL_TABLET | Freq: Four times a day (QID) | ORAL | 0 refills | Status: DC | PRN

## 2022-11-20 MED ORDER — GLYCOPYRROLATE PF 0.2 MG/ML IJ SOSY
PREFILLED_SYRINGE | INTRAMUSCULAR | Status: AC
  Filled 2022-11-20: qty 1

## 2022-11-20 MED ORDER — FENTANYL CITRATE (PF) 100 MCG/2ML IJ SOLN: 25.0000 ug | INTRAMUSCULAR | Status: DC | PRN

## 2022-11-20 MED ORDER — PHENYLEPHRINE 80 MCG/ML (10ML) SYRINGE FOR IV PUSH (FOR BLOOD PRESSURE SUPPORT)
PREFILLED_SYRINGE | INTRAVENOUS | Status: AC
  Filled 2022-11-20: qty 10

## 2022-11-20 MED ORDER — FENTANYL CITRATE (PF) 250 MCG/5ML IJ SOLN
INTRAMUSCULAR | Status: DC | PRN
  Administered 2022-11-20 (×2): 50 ug via INTRAVENOUS

## 2022-11-20 MED ORDER — SILVER NITRATE-POT NITRATE 75-25 % EX MISC
CUTANEOUS | Status: AC
  Filled 2022-11-20: qty 10

## 2022-11-20 MED ORDER — FENTANYL CITRATE (PF) 250 MCG/5ML IJ SOLN
INTRAMUSCULAR | Status: AC
  Filled 2022-11-20: qty 5

## 2022-11-20 MED ORDER — LIDOCAINE HCL 2 % IJ SOLN
INTRAMUSCULAR | Status: DC | PRN
  Administered 2022-11-20: 10 mL

## 2022-11-20 MED ORDER — OXYCODONE HCL 5 MG PO TABS: 5.0000 mg | ORAL_TABLET | Freq: Once | ORAL | Status: DC | PRN

## 2022-11-20 MED ORDER — OXYCODONE HCL 5 MG/5ML PO SOLN: 5.0000 mg | Freq: Once | ORAL | Status: DC | PRN

## 2022-11-20 MED ORDER — ONDANSETRON HCL 4 MG/2ML IJ SOLN
INTRAMUSCULAR | Status: AC
  Filled 2022-11-20: qty 2

## 2022-11-20 MED ORDER — KETOROLAC TROMETHAMINE 30 MG/ML IJ SOLN
INTRAMUSCULAR | Status: DC | PRN
  Administered 2022-11-20: 30 mg via INTRAVENOUS

## 2022-11-20 MED ORDER — MIDAZOLAM HCL 2 MG/2ML IJ SOLN
INTRAMUSCULAR | Status: AC
  Filled 2022-11-20: qty 2

## 2022-11-20 MED ORDER — POVIDONE-IODINE 10 % EX SWAB: 2.0000 | Freq: Once | CUTANEOUS | Status: DC

## 2022-11-20 MED ORDER — SODIUM CHLORIDE 0.9 % IR SOLN
Status: DC | PRN
  Administered 2022-11-20 (×4): 3000 mL

## 2022-11-20 MED ORDER — PROPOFOL 10 MG/ML IV BOLUS
INTRAVENOUS | Status: DC | PRN
  Administered 2022-11-20: 200 mg via INTRAVENOUS

## 2022-11-20 MED ORDER — LIDOCAINE HCL 2 % IJ SOLN
INTRAMUSCULAR | Status: AC
  Filled 2022-11-20: qty 20

## 2022-11-20 MED ORDER — LACTATED RINGERS IV SOLN: INTRAVENOUS | Status: DC

## 2022-11-20 MED ORDER — LIDOCAINE 2% (20 MG/ML) 5 ML SYRINGE
INTRAMUSCULAR | Status: AC
  Filled 2022-11-20: qty 5

## 2022-11-20 MED ORDER — PROPOFOL 10 MG/ML IV BOLUS
INTRAVENOUS | Status: AC
  Filled 2022-11-20: qty 20

## 2022-11-20 MED ORDER — PROPOFOL 500 MG/50ML IV EMUL
INTRAVENOUS | Status: DC | PRN
  Administered 2022-11-20: 25 ug/kg/min via INTRAVENOUS

## 2022-11-20 SURGICAL SUPPLY — 16 items
CATH ROBINSON RED A/P 16FR (CATHETERS) IMPLANT
DILATOR CANAL MILEX (MISCELLANEOUS) IMPLANT
DRSG EMULSION OIL 3X3 NADH (GAUZE/BANDAGES/DRESSINGS) IMPLANT
GAUZE XEROFORM 1X8 LF (GAUZE/BANDAGES/DRESSINGS) IMPLANT
GLOVE BIO SURGEON STRL SZ 6.5 (GLOVE) ×2 IMPLANT
GLOVE SURG UNDER POLY LF SZ7 (GLOVE) ×4 IMPLANT
GOWN STRL REUS W/ TWL LRG LVL3 (GOWN DISPOSABLE) ×4 IMPLANT
GOWN STRL REUS W/TWL LRG LVL3 (GOWN DISPOSABLE) ×4
KIT PROCEDURE FLUENT (KITS) IMPLANT
MYOSURE XL FIBROID REM (MISCELLANEOUS) ×2
PACK VAGINAL MINOR WOMEN LF (CUSTOM PROCEDURE TRAY) ×2 IMPLANT
PAD OB MATERNITY 4.3X12.25 (PERSONAL CARE ITEMS) ×2 IMPLANT
SET GENESYS HTA PROCERVA (MISCELLANEOUS) ×2 IMPLANT
SYSTEM TISS REMOVAL MYSR XL RM (MISCELLANEOUS) IMPLANT
TOWEL GREEN STERILE FF (TOWEL DISPOSABLE) ×2 IMPLANT
UNDERPAD 30X36 HEAVY ABSORB (UNDERPADS AND DIAPERS) ×2 IMPLANT

## 2022-11-20 NOTE — Transfer of Care (Signed)
Immediate Anesthesia Transfer of Care Note  Patient: Jaclyn Haynes  Procedure(s) Performed: DILATATION & CURETTAGE/HYSTEROSCOPY WITH ATTEMPTED  HYDROTHERMAL ABLATION (Vagina ) HYSTEROSCOPY WITH MYOSURE (Uterus)  Patient Location: PACU  Anesthesia Type:General  Level of Consciousness: drowsy  Airway & Oxygen Therapy: Patient connected to face mask oxygen  Post-op Assessment: Report given to RN and Post -op Vital signs reviewed and stable  Post vital signs: Reviewed and stable  Last Vitals:  Vitals Value Taken Time  BP    Temp    Pulse 65 11/20/22 0906  Resp    SpO2 100 % 11/20/22 0906  Vitals shown include unvalidated device data.  Last Pain:  Vitals:   11/20/22 0627  TempSrc:   PainSc: 0-No pain         Complications: There were no known notable events for this encounter.

## 2022-11-20 NOTE — Op Note (Signed)
Pre op DX: MENORRHAGIA   Post Op [id number] masses C/w with fibroid and polyp   PHYSICIAN : Chayla Shands   ASSISTANTS: none   ANESTHESIA:   general LMA and paracervical block  ESTIMATED BLOOD LOSS: minimal  LOCAL MEDICATIONS USED:  LIDOCAINE 10CC  SPECIMEN:  Source of Specimen:  endometrial polyp and fibroid  DISPOSITION OF SPECIMEN:  PATHOLOGY  COUNTS Correct:  YES    DICTATION #: The patient was taken to the operating room and prepped and draped in a normal sterile fashion. An in out catheter was used to drain the bladder.   A bivalve speculum was placed into the vagina and anterior lip of the cervix was grasped with a single-tooth tenaculum.  10 cc of 1% lidocaine was used for cervical block.  the cervix was then dilated with Kennon Rounds dilators up to 21. The hysteroscope was placed into the uterine cavity. The  entire uterus and both ostia were visualized.  A fibroid and polyp was seen.   Hyseroscope was then removed from the uterus.Myosure was used to remove the polyp and fibroid.  I then attempted to do the hydroablation.  There was fluid leaking from the os .  I put vaseline gauze and two other tenacula to try to close the OS.   Three times the seal was noted to have fluid loss   The specimens  were sent to pathology.  The tenaculum was removed from the cervix and hemostasis was noted.   PLAN OF CARE: discharge to home  PATIENT DISPOSITION:  PACU - hemodynamically stable.

## 2022-11-20 NOTE — H&P (Signed)
Jaclyn R Singleton51 y.o. female. Who presents with heavy and irreg vaginal bleeding fo two years.  .  She has uses 1-2 pads/ tampons every hour while menstruating.  She denies any CP or SOB.  Motrin makes it better.  nothingmakes it worse.  She has dysmenorrhea.  Pt has tried OCPS  without success. Pertinent Gynecological History: Contraception: Education given regarding options for contraception, including oral contraceptives. Blood transfusions: none Sexually transmitted diseases: none Previous GYN Procedures: L/S Last mammogram:  Last pap: normal Date: 12/14/21 OB History: G0   Menstrual History: Menarche age: 87 Patient's last menstrual period was 09/13/2022 (approximate).    Past Medical History:  Diagnosis Date   Abnormal Pap smear    CIN1   Allergy    Anemia    Angio-edema    Arthritis    Asthma    Bladder infection    BV (bacterial vaginosis)    Chlamydia    Fibroids    GERD (gastroesophageal reflux disease)    Headache    Migraines   Heart murmur    Hernia    History of hiatal hernia    HSV-2 infection    Hypertension    Irregular heart beat    PONV (postoperative nausea and vomiting)    during gallbladder surgery, "almost died" per mother   2002-12-15   Vitamin B 12 deficiency    Yeast infection    Past Surgical History:  Procedure Laterality Date   BREAST BIOPSY     BREAST LUMPECTOMY Right    CHOLECYSTECTOMY     COLONOSCOPY     DILATATION & CURETTAGE/HYSTEROSCOPY WITH MYOSURE N/A 02/13/2017   Procedure: DILATATION/HYSTEROSCOPY WITH MYOSURE;  Surgeon: Crawford Givens, MD;  Location: [facility];  Service: Gynecology;  Laterality: N/A;  Clint the rep will be here.  Confirmed on 02/08/17 with chass   HYSTEROSCOPY N/A 02/13/2017   Procedure: ATTEMPTED HYDROTHERMAL ABLATION;  Surgeon: Crawford Givens, MD;  Location: [facility];  Service: Gynecology;  Laterality: N/A;  HTA rep will be here     Current Facility-Administered Medications:    lactated ringers infusion, , Intravenous,  Continuous, Myrtie Soman, MD   povidone-iodine 10 % swab 2 Application, 2 Application, Topical, Once, Kerrianne Jeng, MD Allergies  Allergen Reactions   Peanut Oil Anaphylaxis    Other reaction(s): Angioedema   Peanut-Containing Drug Products Anaphylaxis and Hives   Shellfish Allergy Anaphylaxis and Rash   Pollen Extract    Tomato Hives    Only raw tomato causes reaction   Bee Venom Hives, Swelling and Rash    Swelling , more than localized   Other Other (See Comments)    Allergy Testing  Acidic fruits and vegetables, rash    Review of Systems - Negative except above   Physical Exam  BP 124/72   Pulse 74   Temp 98.9 F (37.2 C) (Oral)   Resp 16   Ht '5\' 4"'$  (1.626 m)   Wt 111.1 kg   LMP 09/13/2022 (Approximate)   SpO2 96%   BMI 42.05 kg/m  Constitutional: She appears well-developed and well-nourished.  HENT:  Head: Normocephalic.  Eyes: Pupils are equal, round, and reactive to light.  Neck: Normal range of motion. Neck supple.  Cardiovascular: Regular rhythm.   Respiratory: Effort normal and breath sounds normal.  GI: Soft.  Genitourinary:V/V WNL normal size uterus. No adnexal tenderness or masses Musculoskeletal: Normal range of motion.  Neurological: She is alert.  Skin: Skin is warm.  Psychiatric: She has a normal mood and  affect.  Results for orders placed or performed during the hospital encounter of 11/20/22 (from the past 72 hour(s))  CBC     Status: None   Collection Time: 11/20/22  6:24 AM  Result Value Ref Range   WBC 8.5 4.0 - 10.5 K/uL   RBC 4.18 3.87 - 5.11 MIL/uL   Hemoglobin 12.3 12.0 - 15.0 g/dL   HCT 37.9 36.0 - 46.0 %   MCV 90.7 80.0 - 100.0 fL   MCH 29.4 26.0 - 34.0 pg   MCHC 32.5 30.0 - 36.0 g/dL   RDW 13.2 11.5 - 15.5 %   Platelets 258 150 - 400 K/uL   nRBC 0.0 0.0 - 0.2 %    Comment: Performed at [facility], [address], [place], [place] [address]  Pregnancy, urine POC     Status: None   Collection Time: 11/20/22  6:34 AM   Result Value Ref Range   Preg Test, Ur NEGATIVE NEGATIVE    Comment:        THE SENSITIVITY OF THIS METHODOLOGY IS >24 mIU/mL    [place] width4  Length7.8 Ovariesnot visualized  Assessment/Plan: Menorrhagia Pt offered  obs vs surgery.  Pt chose surgery.  Plan D&C hysteroscopy possible polypectomy and ablation.  Risks are but not limited to bleeding, infection, scarring of the uterus and perforation and vulvar vaginal burns   Jaclyn Haynes A 08/14/2011, 11:41 AM

## 2022-11-20 NOTE — Anesthesia Preprocedure Evaluation (Signed)
Anesthesia Evaluation  Patient identified by MRN, date of birth, ID band Patient awake    Reviewed: Allergy & Precautions, H&P , NPO status , Patient's Chart, lab work & pertinent test results  History of Anesthesia Complications (+) PONV and history of anesthetic complications  Airway Mallampati: II  TM Distance: >3 FB Neck ROM: Full    Dental no notable dental hx.    Pulmonary neg pulmonary ROS   Pulmonary exam normal breath sounds clear to auscultation       Cardiovascular hypertension, Normal cardiovascular exam Rhythm:Regular Rate:Normal     Neuro/Psych negative neurological ROS  negative psych ROS   GI/Hepatic Neg liver ROS,GERD  ,,  Endo/Other    Morbid obesity  Renal/GU negative Renal ROS  negative genitourinary   Musculoskeletal negative musculoskeletal ROS (+)    Abdominal   Peds negative pediatric ROS (+)  Hematology negative hematology ROS (+)   Anesthesia Other Findings   Reproductive/Obstetrics negative OB ROS                             Anesthesia Physical Anesthesia Plan  ASA: 2  Anesthesia Plan: General   Post-op Pain Management: Toradol IV (intra-op)*   Induction: Intravenous  PONV Risk Score and Plan: 4 or greater and Ondansetron, Dexamethasone, Midazolam and Treatment may vary due to age or medical condition  Airway Management Planned: LMA  Additional Equipment:   Intra-op Plan:   Post-operative Plan: Extubation in OR  Informed Consent: I have reviewed the patients History and Physical, chart, labs and discussed the procedure including the risks, benefits and alternatives for the proposed anesthesia with the patient or authorized representative who has indicated his/her understanding and acceptance.     Dental advisory given  Plan Discussed with: CRNA and Surgeon  Anesthesia Plan Comments:        Anesthesia Quick Evaluation

## 2022-11-20 NOTE — Anesthesia Postprocedure Evaluation (Signed)
Anesthesia Post Note  Patient: Jaclyn Haynes  Procedure(s) Performed: DILATATION & CURETTAGE/HYSTEROSCOPY WITH ATTEMPTED  HYDROTHERMAL ABLATION (Vagina ) HYSTEROSCOPY WITH MYOSURE (Uterus)     Patient location during evaluation: PACU Anesthesia Type: General Level of consciousness: awake and alert Pain management: pain level controlled Vital Signs Assessment: post-procedure vital signs reviewed and stable Respiratory status: spontaneous breathing, nonlabored ventilation, respiratory function stable and patient connected to nasal cannula oxygen Cardiovascular status: blood pressure returned to baseline and stable Postop Assessment: no apparent nausea or vomiting Anesthetic complications: no  There were no known notable events for this encounter.  Last Vitals:  Vitals:   11/20/22 0915 11/20/22 0930  BP: 124/84 132/86  Pulse: 73 65  Resp: 17 11  Temp:  37.1 C  SpO2: 100% 100%    Last Pain:  Vitals:   11/20/22 0930  TempSrc:   PainSc: 0-No pain                 Tyleigh Mahn S

## 2022-11-20 NOTE — Anesthesia Procedure Notes (Addendum)
Procedure Name: LMA Insertion Date/Time: 11/20/2022 7:55 AM  Performed by: Ester Rink, CRNAPre-anesthesia Checklist: Patient identified, Emergency Drugs available, Suction available and Patient being monitored Patient Re-evaluated:Patient Re-evaluated prior to induction Oxygen Delivery Method: Circle system utilized Preoxygenation: Pre-oxygenation with 100% oxygen Induction Type: IV induction Ventilation: Mask ventilation without difficulty LMA: LMA inserted LMA Size: 4.0 Tube type: Oral Number of attempts: 1 Placement Confirmation: positive ETCO2 and breath sounds checked- equal and bilateral Tube secured with: Tape Dental Injury: Teeth and Oropharynx as per pre-operative assessment

## 2022-11-21 ENCOUNTER — Encounter (HOSPITAL_COMMUNITY): Payer: Self-pay | Admitting: Obstetrics and Gynecology

## 2022-11-21 LAB — SURGICAL PATHOLOGY

## 2023-01-06 ENCOUNTER — Ambulatory Visit: Payer: 59 | Admitting: Allergy & Immunology

## 2023-03-05 ENCOUNTER — Telehealth: Payer: Self-pay | Admitting: Allergy & Immunology

## 2023-03-05 MED ORDER — SYMBICORT 160-4.5 MCG/ACT IN AERO: 2.0000 | INHALATION_SPRAY | Freq: Two times a day (BID) | RESPIRATORY_TRACT | 0 refills | Status: DC

## 2023-03-05 MED ORDER — SPIRIVA RESPIMAT 1.25 MCG/ACT IN AERS: 2.0000 | INHALATION_SPRAY | Freq: Every day | RESPIRATORY_TRACT | 0 refills | Status: DC

## 2023-03-05 MED ORDER — MONTELUKAST SODIUM 10 MG PO TABS: ORAL_TABLET | ORAL | 0 refills | Status: DC

## 2023-03-05 MED ORDER — LEVOCETIRIZINE DIHYDROCHLORIDE 5 MG PO TABS: 5.0000 mg | ORAL_TABLET | Freq: Every evening | ORAL | 0 refills | Status: DC

## 2023-03-05 NOTE — Telephone Encounter (Addendum)
I received a call from the patient. She reports that was having some problems with breathing since being out of her medications. She had called to get an appointment but it was going to be pretty far out.   I am also adding her to my schedule Thursday at 4:30 pm.  Malachi Bonds, MD [facility]

## 2023-03-08 ENCOUNTER — Encounter: Payer: Self-pay | Admitting: Allergy & Immunology

## 2023-03-08 ENCOUNTER — Ambulatory Visit: Payer: 59 | Admitting: Allergy & Immunology

## 2023-03-08 ENCOUNTER — Other Ambulatory Visit: Payer: Self-pay

## 2023-03-08 VITALS — BP 134/86 | HR 85 | Temp 98.0°F | Resp 16 | Ht 64.5 in | Wt 248.4 lb

## 2023-03-08 DIAGNOSIS — T7800XD Anaphylactic reaction due to unspecified food, subsequent encounter: Secondary | ICD-10-CM | POA: Diagnosis not present

## 2023-03-08 DIAGNOSIS — I159 Secondary hypertension, unspecified: Secondary | ICD-10-CM

## 2023-03-08 DIAGNOSIS — J302 Other seasonal allergic rhinitis: Secondary | ICD-10-CM

## 2023-03-08 DIAGNOSIS — J3089 Other allergic rhinitis: Secondary | ICD-10-CM | POA: Diagnosis not present

## 2023-03-08 DIAGNOSIS — J4540 Moderate persistent asthma, uncomplicated: Secondary | ICD-10-CM

## 2023-03-08 MED ORDER — AIRSUPRA 90-80 MCG/ACT IN AERO: 2.0000 | INHALATION_SPRAY | RESPIRATORY_TRACT | 5 refills | Status: AC | PRN

## 2023-03-08 MED ORDER — PANTOPRAZOLE SODIUM 40 MG PO TBEC: 40.0000 mg | DELAYED_RELEASE_TABLET | Freq: Every day | ORAL | 1 refills | Status: DC

## 2023-03-08 MED ORDER — MONTELUKAST SODIUM 10 MG PO TABS: ORAL_TABLET | ORAL | 1 refills | Status: DC

## 2023-03-08 MED ORDER — ALBUTEROL SULFATE HFA 108 (90 BASE) MCG/ACT IN AERS: 2.0000 | INHALATION_SPRAY | Freq: Four times a day (QID) | RESPIRATORY_TRACT | 1 refills | Status: DC | PRN

## 2023-03-08 MED ORDER — EPINEPHRINE 0.3 MG/0.3ML IJ SOAJ: 0.3000 mg | INTRAMUSCULAR | 1 refills | Status: AC | PRN

## 2023-03-08 MED ORDER — BREZTRI AEROSPHERE 160-9-4.8 MCG/ACT IN AERO: 2.0000 | INHALATION_SPRAY | Freq: Two times a day (BID) | RESPIRATORY_TRACT | 5 refills | Status: DC

## 2023-03-08 NOTE — Patient Instructions (Addendum)
1. Moderate persistent asthma, uncomplicated - Lung testing looked decent today. - We are going to try the [facility] (contains three medications to help with your asthma).  - This will replace your Symbicort.  - Sample of Breztri provided.  - We are also changing you to AirSupra which is albuterol plus an inhaled steroid to help with inflammation.  - Consider Tezspire for better asthma control.  - Daily controller medication(s): Breztri two puffs twice daily with spacer - Prior to physical activity: AirSupra two puffs 10-15 minutes before physical activity. - Rescue medications: AirSupra 4 puffs every 4-6 hours as needed.  - Asthma control goals:  * Full participation in all desired activities (may need albuterol before activity) * Albuterol use two time or less a week on average (not counting use with activity) * Cough interfering with sleep two time or less a month * Oral steroids no more than once a year * No hospitalizations  2. Perennial allergic rhinitis (grasses, weeds, trees, mold, roach, and dust mites) - Continue with Nasacort OTC 1-2 sprays per nostril daily AS NEEDED. - Continue with Xyzal 5mg  daily (you can use one twice daily if needed).   3. Adverse food reaction (peanuts, tree nuts, shrimp, tomato, strawberry) - Continue to avoid peanuts, tree nuts, shrimp, and tomato. Audry Riles updated today.  - Anaphylaxis management plan updated.   4. Return in about 6 months (around 09/07/2023).    Please inform us of any Emergency Department visits, hospitalizations, or changes in symptoms. Call us before going to the ED for breathing or allergy symptoms since we might be able to fit you in for a sick visit. Feel free to contact us anytime with any questions, problems, or concerns.  It was a pleasure to see you again today!  Websites that have reliable patient information: 1. American Academy of Asthma, Allergy, and Immunology: www.aaaai.org 2. Food Allergy Research and Education  (FARE): foodallergy.org 3. Mothers of Asthmatics: http://www.asthmacommunitynetwork.org 4. American College of Allergy, Asthma, and Immunology: www.acaai.org   COVID-19 Vaccine Information can be found at: [email or web address] For questions related to vaccine distribution or appointments, please email [email or web address] or call [phone].     "Like" [place] on Facebook and Instagram for our latest updates!        Make sure you are registered to vote! If you have moved or changed any of your contact information, you will need to get this updated before voting!  In some cases, you MAY be able to register to vote online: [email or web address]

## 2023-03-08 NOTE — Progress Notes (Signed)
FOLLOW UP  Date of Service/Encounter:  03/08/23   Assessment:   Moderate persistent asthma without complication - with adverse reaction to Xolair and Dupixent  Shortness of breath - relived with albuterol (consider echocardiogram at the next visit if this is not resolved)  Hypertension - on spironolactone, losartan, HCTZ and carvedilol (does NOT see Cardiology, consider referral at the next visit)   Adverse food reactions (peanuts, tree nuts, shrimp, tomato) - with low positives to peanut, tree nuts, tomato   Seasonal and perennial allergic rhinitis (grasses, weeds, trees, mold, roach, and dust mites)   GERD - on Protonix  Plan/Recommendations:   1. Moderate persistent asthma, uncomplicated - Lung testing looked decent today. - We are going to try the [facility] (contains three medications to help with your asthma).  - This will replace your Symbicort.  - Sample of Breztri provided.  - We are also changing you to AirSupra which is albuterol plus an inhaled steroid to help with inflammation.  - Consider Tezspire for better asthma control.  - Daily controller medication(s): Breztri two puffs twice daily with spacer - Prior to physical activity: AirSupra two puffs 10-15 minutes before physical activity. - Rescue medications: AirSupra 4 puffs every 4-6 hours as needed.  - Asthma control goals:  * Full participation in all desired activities (may need albuterol before activity) * Albuterol use two time or less a week on average (not counting use with activity) * Cough interfering with sleep two time or less a month * Oral steroids no more than once a year * No hospitalizations  2. Perennial allergic rhinitis (grasses, weeds, trees, mold, roach, and dust mites) - Continue with Nasacort OTC 1-2 sprays per nostril daily AS NEEDED. - Continue with Xyzal 5mg  daily (you can use one twice daily if needed).   3. Adverse food reaction (peanuts, tree nuts, shrimp, tomato, strawberry) -  Continue to avoid peanuts, tree nuts, shrimp, and tomato. Jaclyn Haynes updated today.  - Anaphylaxis management plan updated.   4. Return in about 6 months (around 09/07/2023).     Subjective:   Jaclyn Haynes is a 52 y.o. female presenting today for follow up of  Chief Complaint  Patient presents with   Follow-up    SOB,cough,wheezing    [name] has a history of the following: Patient Active Problem List   Diagnosis Date Noted   Angioedema of lips 05/21/2016   Allergic reaction 05/21/2016   Palpitation 08/04/2015   Migraine without aura and without status migrainosus, not intractable 10/27/2014   Dizziness and giddiness 10/27/2013   Disturbance of skin sensation 10/27/2013    History obtained from: chart review and patient.  Jaclyn Haynes is a 52 y.o. female presenting for a follow up visit.  She was last seen in May 2023.  At that time, her lung testing looked better.  We gave her a sample of Breztri to use in place of her Symbicort.  We continue with 2 puffs twice daily as well as albuterol as needed.  For her allergic rhinitis, we continue with Nasacort as well as Xyzal and Atrovent.  She continue to avoid all of her triggering foods.  We updated her epinephrine autoinjector.  Since last visit, she has done well.   She remains on the Augmentin. She is feeling better with this regimen. She does not think that she needs prednisone at this point, but she will keep in touch.   Asthma/Respiratory Symptom History: She has been trying to stretch her Symbicort.  She was trying to make it last longer.  She is on the Spiriva (but this is making her tongue feel funny). She is not sure that she is doing as good as she could do. She does report I improvement with the albuterol.   She wants to be able to walk up steps without being out of breath. She wants to be able to walk fast without feeling out of breath. She feels that this is not exactly where she wants to be. While she is not on  the verge of going to the hospital, she is likewise not feeling as good as she thinks she could feel.   She was on Xolair in the distant past. She stopped this due to pruritus around the injection sites. She also had a similar reaction to Dupixent. She is not averse to trying another biologic to help her to get to where she wants to be, but she does not want to do anything that will add a lot of extra cost.   Allergic Rhinitis Symptom History: She remains on the levocetirizine.  She is using OTC Nasacort. Flonase burns her nose. She did like Nasonex, but her insurance did not cover it any longer.   Food Allergy Symptom History: She continues to avoid shellfish, peanuts, tree nuts, tomato, and strawberry. She has not had any accidental ingestions. She does need a refill of her AuviQ.   She was supposed to have a uterine artery ablation in February 2024. She was going to have the procedure done and she ended up having a fibroid removed from her uterus. But she had some issues with hypertension.   She remains on the carvedilol and losartan. She does have periodic heart palpitations. She is on spironolactone and HCTZ. She did see Cardiology years ago and had an echocardiogram. This was when she was in her 84s.   Otherwise, there have been no changes to her past medical history, surgical history, family history, or social history.    Review of Systems  Constitutional: Negative.  Negative for chills, fever, malaise/fatigue and weight loss.  HENT: Negative.  Negative for congestion, ear discharge and ear pain.   Eyes:  Negative for pain, discharge and redness.  Respiratory:  Positive for cough, shortness of breath and wheezing. Negative for sputum production.   Cardiovascular: Negative.  Negative for chest pain and palpitations.  Gastrointestinal:  Negative for abdominal pain, constipation, diarrhea, heartburn, nausea and vomiting.  Skin: Negative.  Negative for itching and rash.  Neurological:   Negative for dizziness and headaches.  Endo/Heme/Allergies:  Negative for environmental allergies. Does not bruise/bleed easily.       Objective:   Blood pressure 134/86, pulse 85, temperature 98 F (36.7 C), resp. rate 16, height 5' 4.5" (1.638 m), weight 248 lb 6 oz (112.7 kg), SpO2 97 %. Body mass index is 41.98 kg/m.    Physical Exam Vitals reviewed.  Constitutional:      Appearance: Normal appearance. She is well-developed.     Comments: Smiling as always.  Very pleasant.  Adorable.  HENT:     Head: Normocephalic and atraumatic.     Right Ear: Tympanic membrane, ear canal and external ear normal.     Left Ear: Tympanic membrane, ear canal and external ear normal.     Nose: No rhinorrhea.     Right Turbinates: Enlarged, swollen and pale.     Left Turbinates: Enlarged, swollen and pale.     Comments: Minimal cobblestoning.    Mouth/Throat:  Lips: Pink. No lesions.     Mouth: Mucous membranes are moist. Mucous membranes are not dry.  Eyes:     General: Lids are normal.        Right eye: No discharge.        Left eye: No discharge.     Conjunctiva/sclera: Conjunctivae normal.     Right eye: Right conjunctiva is not injected. No chemosis.    Left eye: Left conjunctiva is not injected. No chemosis.    Pupils: Pupils are equal, round, and reactive to light.  Cardiovascular:     Rate and Rhythm: Normal rate and regular rhythm.     Heart sounds: Normal heart sounds.  Pulmonary:     Effort: Pulmonary effort is normal. No tachypnea, accessory muscle usage or respiratory distress.     Breath sounds: Normal breath sounds. No wheezing, rhonchi or rales.     Comments: Moving air well in all lung fields.  Speaking in full sentences.  Chest:     Chest wall: No tenderness.  Abdominal:     Tenderness: There is no abdominal tenderness. There is no guarding or rebound.  Lymphadenopathy:     Head:     Right side of head: No submandibular, tonsillar or occipital adenopathy.      Left side of head: No submandibular, tonsillar or occipital adenopathy.     Cervical: No cervical adenopathy.  Skin:    General: Skin is warm.     Capillary Refill: Capillary refill takes less than 2 seconds.     Coloration: Skin is not pale.     Findings: No abrasion, erythema, petechiae or rash. Rash is not papular, urticarial or vesicular.     Comments: No eczematous and urticarial lesions noted.   Neurological:     Mental Status: She is alert.  Psychiatric:        Behavior: Behavior is cooperative.      Diagnostic studies:    Spirometry: results normal (FEV1: 1.65/71%, FVC: 2.27/78%, FEV1/FVC: 73%).    Spirometry consistent with normal pattern.    Allergy Studies: none       Malachi Bonds, MD  [facility]

## 2023-03-22 ENCOUNTER — Telehealth: Payer: Self-pay | Admitting: *Deleted

## 2023-03-22 NOTE — Telephone Encounter (Signed)
L/m for patient to contact me to discuss Tezspire for her asthma

## 2023-03-22 NOTE — Telephone Encounter (Signed)
-----   Message from Alfonse Spruce, MD sent at 03/08/2023  7:50 PM EDT ----- Consider Dorothea Ogle.Marland KitchenMarland Kitchen

## 2023-05-07 NOTE — Telephone Encounter (Signed)
L/m for patient again to reach out to me °

## 2023-05-17 NOTE — Telephone Encounter (Signed)
No response from patient

## 2023-05-22 NOTE — Telephone Encounter (Signed)
I called the patient and she did not recall getting any phone calls. I gave her your work office #.   Malachi Bonds, MD [facility]

## 2023-05-25 ENCOUNTER — Telehealth: Payer: Self-pay | Admitting: *Deleted

## 2023-05-25 NOTE — Telephone Encounter (Signed)
Called patient and discussed Tezspire with her. Have l/m for her again to contact me to advise her approval,copay card and submit to [employer].

## 2023-05-25 NOTE — Telephone Encounter (Signed)
Patient advised of submit and wants to start injs in clinic for now

## 2023-05-29 NOTE — Telephone Encounter (Signed)
Great - glad she finally reached out!

## 2023-06-12 ENCOUNTER — Ambulatory Visit (INDEPENDENT_AMBULATORY_CARE_PROVIDER_SITE_OTHER): Payer: 59 | Admitting: *Deleted

## 2023-06-12 DIAGNOSIS — J4550 Severe persistent asthma, uncomplicated: Secondary | ICD-10-CM

## 2023-06-12 MED ORDER — TEZEPELUMAB-EKKO 210 MG/1.91ML ~~LOC~~ SOSY
210.0000 mg | PREFILLED_SYRINGE | Freq: Once | SUBCUTANEOUS | Status: AC
  Administered 2023-06-12: 210 mg via SUBCUTANEOUS

## 2023-06-12 NOTE — Progress Notes (Signed)
Immunotherapy   Patient Details  Name: Jaclyn Haynes MRN: [id number] Date of Birth: 05-03-1971  06/12/2023  Orlene Erm started injections for  Tezspire  Frequency: Every 28 days  Epi-Pen:No  Consent signed and patient instructions given. Patient started Tezspire today and received 1.32mL in the R[facility]. Patient waited 30 minutes in office and did not experience any issues.   Aleksey Newbern Fernandez-Vernon 06/12/2023, 5:16 PM

## 2023-07-10 ENCOUNTER — Ambulatory Visit: Payer: 59

## 2023-09-13 ENCOUNTER — Other Ambulatory Visit: Payer: Self-pay

## 2023-09-13 ENCOUNTER — Ambulatory Visit (INDEPENDENT_AMBULATORY_CARE_PROVIDER_SITE_OTHER): Payer: 59 | Admitting: Allergy & Immunology

## 2023-09-13 ENCOUNTER — Encounter: Payer: Self-pay | Admitting: Allergy & Immunology

## 2023-09-13 VITALS — BP 136/82 | HR 78 | Temp 97.8°F | Resp 16 | Ht 64.0 in | Wt 259.3 lb

## 2023-09-13 DIAGNOSIS — T7800XD Anaphylactic reaction due to unspecified food, subsequent encounter: Secondary | ICD-10-CM

## 2023-09-13 DIAGNOSIS — R42 Dizziness and giddiness: Secondary | ICD-10-CM

## 2023-09-13 DIAGNOSIS — J4550 Severe persistent asthma, uncomplicated: Secondary | ICD-10-CM

## 2023-09-13 DIAGNOSIS — J302 Other seasonal allergic rhinitis: Secondary | ICD-10-CM

## 2023-09-13 DIAGNOSIS — J3089 Other allergic rhinitis: Secondary | ICD-10-CM

## 2023-09-13 MED ORDER — LEVOCETIRIZINE DIHYDROCHLORIDE 5 MG PO TABS: 5.0000 mg | ORAL_TABLET | Freq: Every evening | ORAL | 1 refills | Status: DC

## 2023-09-13 MED ORDER — PREDNISONE 10 MG PO TABS: ORAL_TABLET | ORAL | 0 refills | Status: DC

## 2023-09-13 MED ORDER — BREZTRI AEROSPHERE 160-9-4.8 MCG/ACT IN AERO: 2.0000 | INHALATION_SPRAY | Freq: Two times a day (BID) | RESPIRATORY_TRACT | 5 refills | Status: AC

## 2023-09-13 MED ORDER — ALBUTEROL SULFATE HFA 108 (90 BASE) MCG/ACT IN AERS: 2.0000 | INHALATION_SPRAY | Freq: Four times a day (QID) | RESPIRATORY_TRACT | 1 refills | Status: AC | PRN

## 2023-09-13 NOTE — Patient Instructions (Addendum)
1. Moderate persistent asthma, uncomplicated - Lung testing looked decent today. - We are going to send in a prescription for Breztri since this has a $0 copay card. - This will replace your Symbicort.  - We are also giving you a sample of AirSupra which is albuterol plus an inhaled steroid to help with inflammation (the steroid also helps your lungs to utilize the albuterol more effectively when you need it).  - Daily controller medication(s): Breztri two puffs twice daily with spacer - Prior to physical activity: albuterol or AirSupra two puffs 10-15 minutes before physical activity. - Rescue medications: albuterol or AirSupra 4 puffs every 4-6 hours as needed.  - Asthma control goals:  * Full participation in all desired activities (may need albuterol before activity) * Albuterol use two time or less a week on average (not counting use with activity) * Cough interfering with sleep two time or less a month * Oral steroids no more than once a year * No hospitalizations  2. Perennial allergic rhinitis (grasses, weeds, trees, mold, roach, and dust mites) - Continue with Nasacort OTC 1-2 sprays per nostril daily AS NEEDED. - Continue with Xyzal 5mg  daily (you can use one twice daily if needed).   3. Adverse food reaction (peanuts, tree nuts, shrimp, tomato, strawberry) - Continue to avoid peanuts, tree nuts, shrimp, and tomato. - Anaphylaxis management plan up to date. - EpiPen is up to date.   4. Vertigo - We can send you to see Dr. Suszanne Conners and his Vestibular Rehabilitation program. - Let us know if you want to pursue this. - We are starting you on a low dose prednisone to help clear up the pressure on the left side.  5. Return in about 6 months (around 03/13/2024). You can have the follow up appointment with Dr. Dellis Anes or a Nurse Practicioner (our Nurse Practitioners are excellent and always have Physician oversight!).    Please inform us of any Emergency Department visits,  hospitalizations, or changes in symptoms. Call us before going to the ED for breathing or allergy symptoms since we might be able to fit you in for a sick visit. Feel free to contact us anytime with any questions, problems, or concerns.  It was a pleasure to see you again today! Thank you for the good work that you are doing!   Websites that have reliable patient information: 1. American Academy of Asthma, Allergy, and Immunology: www.aaaai.org 2. Food Allergy Research and Education (FARE): foodallergy.org 3. Mothers of Asthmatics: http://www.asthmacommunitynetwork.org 4. American College of Allergy, Asthma, and Immunology: www.acaai.org      "Like" [place] on Facebook and Instagram for our latest updates!      A healthy democracy works best when [employer] participate! Make sure you are registered to vote! If you have moved or changed any of your contact information, you will need to get this updated before voting! Scan the QR codes below to learn more!

## 2023-09-13 NOTE — Progress Notes (Signed)
FOLLOW UP  Date of Service/Encounter:  09/13/23   Assessment:   Moderate persistent asthma without complication - with adverse reaction to Xolair and Dupixent and Tezspire    Hypertension - on spironolactone, losartan, HCTZ and carvedilol (does NOT see Cardiology, consider referral at the next visit)   Adverse food reactions (peanuts, tree nuts, shrimp, tomato) - with low positives to peanut, tree nuts, tomato   Seasonal and perennial allergic rhinitis (grasses, weeds, trees, mold, roach, and dust mites)    GERD - on Protonix    Plan/Recommendations:   1. Moderate persistent asthma, uncomplicated - Lung testing looked decent today. - We are going to send in a prescription for Breztri since this has a $0 copay card. - This will replace your Symbicort.  - We are also giving you a sample of AirSupra which is albuterol plus an inhaled steroid to help with inflammation (the steroid also helps your lungs to utilize the albuterol more effectively when you need it).  - Daily controller medication(s): Breztri two puffs twice daily with spacer - Prior to physical activity: albuterol or AirSupra two puffs 10-15 minutes before physical activity. - Rescue medications: albuterol or AirSupra 4 puffs every 4-6 hours as needed.  - Asthma control goals:  * Full participation in all desired activities (may need albuterol before activity) * Albuterol use two time or less a week on average (not counting use with activity) * Cough interfering with sleep two time or less a month * Oral steroids no more than once a year * No hospitalizations  2. Perennial allergic rhinitis (grasses, weeds, trees, mold, roach, and dust mites) - Continue with Nasacort OTC 1-2 sprays per nostril daily AS NEEDED. - Continue with Xyzal 5mg  daily (you can use one twice daily if needed).   3. Adverse food reaction (peanuts, tree nuts, shrimp, tomato, strawberry) - Continue to avoid peanuts, tree nuts, shrimp, and  tomato. - Anaphylaxis management plan up to date. - EpiPen is up to date.   4. Vertigo - We can send you to see Jaclyn Haynes and his Vestibular Rehabilitation program. - Let Jaclyn Haynes know if you want to pursue this. - We are starting you on a low dose prednisone to help clear up the pressure on the left side.  5. Return in about 6 months (around 03/13/2024). You can have the follow up appointment with Dr. Dellis Anes or a Nurse Practicioner (our Nurse Practitioners are excellent and always have Physician oversight!).   Subjective:   Jaclyn Haynes is a 52 y.o. female presenting today for follow up of  Chief Complaint  Patient presents with   Asthma    6 mth f/u - SoSo   Seasonal and Perennial Allergic Rhinitis    6 mth f/u - Not good   Food Allergy    6 mth f/u - Patient stated she has been avoiding all food allergens.    Jaclyn Haynes has a history of the following: Patient Active Problem List   Diagnosis Date Noted   Angioedema of lips 05/21/2016   Allergic reaction 05/21/2016   Palpitation 08/04/2015   Migraine without aura and without status migrainosus, not intractable 10/27/2014   Dizziness and giddiness 10/27/2013   Disturbance of skin sensation 10/27/2013    History obtained from: chart review and patient.  Discussed the use of AI scribe software for clinical note transcription with the patient and/or guardian, who gave verbal consent to proceed.  Jaclyn Haynes is a 52 y.o. female presenting for a  follow up visit. She was las tseen in June 2024. At that time, lung testing looked excellent. We gave her a sample of Breztri to use to replace her Symbicort.We also gave her a sample of AirSupra. We did discuss doing Tezspire to help with her asthma control. For her rhinitis, we continued with the Nasacort as well as levocetirizine one tablet up to BID. For her food allergies, we updated her epinephrine autoinjector.   Since the last visit, unfortunately, she had an adverse reaction to  the Tezspire. She only received one dose of the medication. She is not sure whether it actually helped her breathing at all.   Asthma/Respiratory Symptom History: The patient has a history of reacting adversely to several asthma medications, including Tezspire, which led to a severe lower back pain flare-up requiring prednisone and hydrocodone for management.  She has since felt better since the reaction. She remains on the Symbicort for asthma management and has trialed Breztri in the past with similar efficacy.  She does need a refill on her albuterol.  Her co-pay with Symbicort is $50 which she thinks is affordable, but when I mention the $0 co-pay card for [place] she is very interested.  She has been on Xolair as well as Dupixent in the past.  However, she reacted adversely to both of these.  Allergic Rhinitis Symptom History: They also report exposure to multiple allergens, including dust mites and animals, despite not owning any pets themselves. The patient's allergies seem to be particularly problematic during this time of year. Her allergic rhinitis seems to have gotten under worse control as of late.  She has never been on allergy shots.  We have gotten close to starting the past, but her coverage for allergy shots is pretty terrible.  She has been experiencing ear discomfort for a couple of weeks, particularly in the left ear. She denies any fever. [name] has new complaints of vertigo and sinus headaches. They report experiencing vertigo almost daily for over a month, with episodes severe enough to affect balance and cause near falls. Over-the-counter Dramamine has been used with limited success. The patient also reports daily headaches localized to the frontal sinus region.  She has seen ENT in the past, but it has been several years.  She is now in a different position at work. She is over some more people but the job seems overall more stressful. She seems to enjoy it, however.   Otherwise,  there have been no changes to her past medical history, surgical history, family history, or social history.    Review of systems otherwise negative other than that mentioned in the HPI.    Objective:   Blood pressure 136/82, pulse 78, temperature 97.8 F (36.6 C), temperature source Temporal, resp. rate 16, height 5\' 4"  (1.626 m), weight 259 lb 4.8 oz (117.6 kg), SpO2 100%. Body mass index is 44.51 kg/m.    Physical Exam Vitals reviewed.  Constitutional:      Appearance: Normal appearance. She is well-developed.     Comments: Smiling as always.  Very pleasant.  Adorable.  HENT:     Head: Normocephalic and atraumatic.     Right Ear: Tympanic membrane, ear canal and external ear normal.     Left Ear: Tympanic membrane, ear canal and external ear normal.     Nose: No rhinorrhea.     Right Turbinates: Enlarged, swollen and pale.     Left Turbinates: Enlarged, swollen and pale.     Comments: Minimal cobblestoning.  Mouth/Throat:     Lips: Pink. No lesions.     Mouth: Mucous membranes are moist. Mucous membranes are not dry.  Eyes:     General: Lids are normal.        Right eye: No discharge.        Left eye: No discharge.     Conjunctiva/sclera: Conjunctivae normal.     Right eye: Right conjunctiva is not injected. No chemosis.    Left eye: Left conjunctiva is not injected. No chemosis.    Pupils: Pupils are equal, round, and reactive to light.  Cardiovascular:     Rate and Rhythm: Normal rate and regular rhythm.     Heart sounds: Normal heart sounds.  Pulmonary:     Effort: Pulmonary effort is normal. No tachypnea, accessory muscle usage or respiratory distress.     Breath sounds: Normal breath sounds. No wheezing, rhonchi or rales.     Comments: Moving air well in all lung fields.  Speaking in full sentences.  Chest:     Chest wall: No tenderness.  Abdominal:     Tenderness: There is no abdominal tenderness. There is no guarding or rebound.  Lymphadenopathy:      Head:     Right side of head: No submandibular, tonsillar or occipital adenopathy.     Left side of head: No submandibular, tonsillar or occipital adenopathy.     Cervical: No cervical adenopathy.  Skin:    General: Skin is warm.     Capillary Refill: Capillary refill takes less than 2 seconds.     Coloration: Skin is not pale.     Findings: No abrasion, erythema, petechiae or rash. Rash is not papular, urticarial or vesicular.     Comments: No eczematous and urticarial lesions noted.   Neurological:     Mental Status: She is alert.  Psychiatric:        Behavior: Behavior is cooperative.      Diagnostic studies:    Spirometry: results normal (FEV1: 1.65/71%, FVC: 2.29/79%, FEV1/FVC: 72%).    Spirometry consistent with possible restrictive disease.    Allergy Studies: none        Malachi Bonds, MD  [facility]

## 2023-09-15 NOTE — Addendum Note (Signed)
Addended by: Alfonse Spruce on: 09/15/2023 03:12 PM   Modules accepted: Orders

## 2023-10-01 ENCOUNTER — Encounter (INDEPENDENT_AMBULATORY_CARE_PROVIDER_SITE_OTHER): Payer: Self-pay | Admitting: Otolaryngology

## 2023-10-01 ENCOUNTER — Telehealth: Payer: Self-pay

## 2023-10-01 NOTE — Telephone Encounter (Signed)
-----   Message from Alfonse Spruce sent at 09/15/2023  3:12 PM EST ----- Referral placed for vertigo.

## 2023-10-02 ENCOUNTER — Ambulatory Visit: Payer: 59 | Admitting: Allergy & Immunology

## 2023-10-02 ENCOUNTER — Other Ambulatory Visit: Payer: Self-pay

## 2023-10-02 VITALS — BP 128/80 | HR 79 | Temp 97.8°F | Ht 64.0 in | Wt 261.1 lb

## 2023-10-02 DIAGNOSIS — T7800XD Anaphylactic reaction due to unspecified food, subsequent encounter: Secondary | ICD-10-CM

## 2023-10-02 DIAGNOSIS — J3089 Other allergic rhinitis: Secondary | ICD-10-CM | POA: Diagnosis not present

## 2023-10-02 DIAGNOSIS — J4550 Severe persistent asthma, uncomplicated: Secondary | ICD-10-CM

## 2023-10-02 DIAGNOSIS — J0100 Acute maxillary sinusitis, unspecified: Secondary | ICD-10-CM

## 2023-10-02 DIAGNOSIS — J302 Other seasonal allergic rhinitis: Secondary | ICD-10-CM

## 2023-10-02 MED ORDER — FLUCONAZOLE 150 MG PO TABS
ORAL_TABLET | ORAL | 5 refills | Status: AC
Start: 2023-10-02 — End: ?

## 2023-10-02 MED ORDER — AMOXICILLIN-POT CLAVULANATE 875-125 MG PO TABS: 1.0000 | ORAL_TABLET | Freq: Two times a day (BID) | ORAL | 0 refills | Status: AC

## 2023-10-02 MED ORDER — PREDNISONE 10 MG PO TABS: ORAL_TABLET | ORAL | 0 refills | Status: DC

## 2023-10-02 NOTE — Patient Instructions (Addendum)
 1. Moderate persistent asthma, uncomplicated - Lung testing looked great today. - We are not going to make any changes this year. - Daily controller medication(s): Breztri  two puffs twice daily with spacer - Prior to physical activity: albuterol  or AirSupra  two puffs 10-15 minutes before physical activity. - Rescue medications: albuterol  or AirSupra  4 puffs every 4-6 hours as needed.  - Asthma control goals:  * Full participation in all desired activities (may need albuterol  before activity) * Albuterol  use two time or less a week on average (not counting use with activity) * Cough interfering with sleep two time or less a month * Oral steroids no more than once a year * No hospitalizations  2. Perennial allergic rhinitis (grasses, weeds, trees, mold, roach, and dust mites) - Continue with Nasacort  OTC 1-2 sprays per nostril daily AS NEEDED. - Continue with Xyzal  5mg  daily (you can use one twice daily if needed).   3. Acute sinusitis - Start Augmentin  twice daily for ten days. - Start prednisone  taper provided.   4. Adverse food reaction (peanuts, tree nuts, shrimp, tomato, strawberry) - Continue to avoid peanuts, tree nuts, shrimp, and tomato. - Anaphylaxis management plan up to date. - EpiPen  is up to date.   5. Vertigo - We can send you to see Dr. Karis and his Vestibular Rehabilitation program. - Let us  know if you want to pursue this. - We are starting you on a low dose prednisone  to help clear up the pressure on the left side.  6. Follow up as scheduled.    Please inform us  of any Emergency Department visits, hospitalizations, or changes in symptoms. Call us  before going to the ED for breathing or allergy  symptoms since we might be able to fit you in for a sick visit. Feel free to contact us  anytime with any questions, problems, or concerns.  It was a pleasure to see you again today!    Websites that have reliable patient information: 1. American Academy of Asthma,  Allergy , and Immunology: www.aaaai.org 2. Food Allergy  Research and Education (FARE): foodallergy.org 3. Mothers of Asthmatics: http://www.asthmacommunitynetwork.org 4. [employer] of Allergy , Asthma, and Immunology: www.acaai.org      "Like" us  on Facebook and Instagram for our latest updates!      A healthy democracy works best when [employer] participate! Make sure you are registered to vote! If you have moved or changed any of your contact information, you will need to get this updated before voting! Scan the QR codes below to learn more!

## 2023-10-02 NOTE — Progress Notes (Signed)
 FOLLOW UP  Date of Service/Encounter:  10/02/23   Assessment:   Moderate persistent asthma without complication - with adverse reaction to Xolair  and Dupixent  and Tezspire     Hypertension - on spironolactone, losartan, HCTZ and carvedilol (does NOT see Cardiology, consider referral at the next visit)   Adverse food reactions (peanuts, tree nuts, shrimp, tomato) - with low positives to peanut, tree nuts, tomato   Seasonal and perennial allergic rhinitis (grasses, weeds, trees, mold, roach, and dust mites)    GERD - on Protonix   Sinusitis - sta  Plan/Recommendations:   1. Moderate persistent asthma, uncomplicated - Lung testing looked great today. - We are not going to make any changes this year. - Daily controller medication(s): Breztri  two puffs twice daily with spacer - Prior to physical activity: albuterol  or AirSupra  two puffs 10-15 minutes before physical activity. - Rescue medications: albuterol  or AirSupra  4 puffs every 4-6 hours as needed.  - Asthma control goals:  * Full participation in all desired activities (may need albuterol  before activity) * Albuterol  use two time or less a week on average (not counting use with activity) * Cough interfering with sleep two time or less a month * Oral steroids no more than once a year * No hospitalizations  2. Perennial allergic rhinitis (grasses, weeds, trees, mold, roach, and dust mites) - Continue with Nasacort  OTC 1-2 sprays per nostril daily AS NEEDED. - Continue with Xyzal  5mg  daily (you can use one twice daily if needed).   3. Acute sinusitis - Start Augmentin  twice daily for ten days. - Start prednisone  taper provided.   4. Adverse food reaction (peanuts, tree nuts, shrimp, tomato, strawberry) - Continue to avoid peanuts, tree nuts, shrimp, and tomato. - Anaphylaxis management plan up to date. - EpiPen  is up to date.   5. Vertigo - We can send you to see Dr. Karis and his Vestibular Rehabilitation program. -  Let us  know if you want to pursue this. - We are starting you on a low dose prednisone  to help clear up the pressure on the left side.  6. Follow up as scheduled.   Subjective:   Jaclyn Haynes is a 53 y.o. female presenting today for follow up of  Chief Complaint  Patient presents with   Other    States that she is experiencing facial pain due to sinus issues with some nasal drainage x 2 weeks      Jaclyn Haynes has a history of the following: Patient Active Problem List   Diagnosis Date Noted   Angioedema of lips 05/21/2016   Allergic reaction 05/21/2016   Palpitation 08/04/2015   Migraine without aura and without status migrainosus, not intractable 10/27/2014   Dizziness and giddiness 10/27/2013   Disturbance of skin sensation 10/27/2013    History obtained from: chart review and patient.  Discussed the use of AI scribe software for clinical note transcription with the patient and/or guardian, who gave verbal consent to proceed.  Jaclyn Haynes is a 53 y.o. female presenting for a sick visit.  She was last seen in December 2024.  At that time, lung testing looked decent.  We sent in a prescription for Breztri .  We continue with albuterol  for Airsupra  2 puffs 10 to 15 minutes before physical activity and then as needed.  For her rhinitis, we continue with Nasacort  and Xyzal .  She continue to avoid all of her triggering foods including peanuts, tree nuts, shrimp, tomato, and strawberry.  EpiPen  was up-to-date.  She  was experiencing some vertigo and we talked about referring her to see vestibular rehab.  We did start her on a low-dose prednisone  taper as well.  Since last visit, she has not done well.     She has a history of vertigo and sinus issues, presents with worsening facial pain and pressure, particularly in the left maxillary region. The discomfort, described as a weighty sensation, has been ongoing for approximately two weeks and has progressively worsened. The patient  initially thought the symptoms would improve with prednisone , which was prescribed for her ears, but the discomfort has persisted and even intensified. She still experiences occasional episodes of imbalance, necessitating support to prevent falls.  She also reports a persistent headache and a sensation of nasal congestion with clear rhinorrhea. She has noticed postnasal drainage leading to a cough, which was particularly severe over the past weekend. The patient denies any colored nasal discharge. The prednisone  did provide some relief.   She mentions a history of skin issues, including sun spots and breakouts, which have been managed by a dermatologist with spironolactone and topical treatments. The patient expresses dissatisfaction with the current state of her skin, noting that the breakouts are not completely controlled and the skin often becomes excessively dry due to the treatments.  Her medication regimen includes an inhaler, which was recently switched due to cost, and she reports no adverse effects from this change. She is on Breztri  as well as AirSupra .she is hoping that the copay cards continue to work in 2025.  She is generally averse from taking antibiotics and has not been on any for a long time. She has no known antibiotic allergies. She also mentions occasional yeast infections, which she usually manages with over-the-counter treatments.    Otherwise, there have been no changes to her past medical history, surgical history, family history, or social history.    Review of systems otherwise negative other than that mentioned in the HPI.    Objective:   Blood pressure 128/80, pulse 79, temperature 97.8 F (36.6 C), height 5' 4 (1.626 m), weight 261 lb 1.6 oz (118.4 kg), SpO2 98%. Body mass index is 44.82 kg/m.    Physical Exam Vitals reviewed.  Constitutional:      Appearance: Normal appearance. She is well-developed.     Comments: Smiling as always.  But she does appear  a bit more downtrodden today.  HENT:     Head: Normocephalic and atraumatic.     Right Ear: Tympanic membrane, ear canal and external ear normal.     Left Ear: Tympanic membrane, ear canal and external ear normal.     Ears:     Comments: Moving air well in all lung fields. No increased work of breathing noted.     Nose: No rhinorrhea.     Right Turbinates: Enlarged, swollen and pale.     Left Turbinates: Enlarged, swollen and pale.     Comments: Minimal cobblestoning.    Mouth/Throat:     Lips: Pink. No lesions.     Mouth: Mucous membranes are moist. Mucous membranes are not dry.  Eyes:     General: Lids are normal. Allergic shiner present.        Right eye: No discharge.        Left eye: No discharge.     Conjunctiva/sclera: Conjunctivae normal.     Right eye: Right conjunctiva is not injected. No chemosis.    Left eye: Left conjunctiva is not injected. No chemosis.    Pupils:  Pupils are equal, round, and reactive to light.  Cardiovascular:     Rate and Rhythm: Normal rate and regular rhythm.     Heart sounds: Normal heart sounds.  Pulmonary:     Effort: Pulmonary effort is normal. No tachypnea, accessory muscle usage or respiratory distress.     Breath sounds: Normal breath sounds. No wheezing, rhonchi or rales.     Comments: Moving air well in all lung fields.  Speaking in full sentences.  Chest:     Chest wall: No tenderness.  Abdominal:     Tenderness: There is no abdominal tenderness. There is no guarding or rebound.  Lymphadenopathy:     Head:     Right side of head: No submandibular, tonsillar or occipital adenopathy.     Left side of head: No submandibular, tonsillar or occipital adenopathy.     Cervical: No cervical adenopathy.  Skin:    General: Skin is warm.     Capillary Refill: Capillary refill takes less than 2 seconds.     Coloration: Skin is not pale.     Findings: No abrasion, erythema, petechiae or rash. Rash is not papular, urticarial or vesicular.      Comments: No eczematous and urticarial lesions noted.   Neurological:     Mental Status: She is alert.  Psychiatric:        Behavior: Behavior is cooperative.      Diagnostic studies: none     Marty Shaggy, MD  [facility] 

## 2023-10-04 ENCOUNTER — Encounter: Payer: Self-pay | Admitting: Allergy & Immunology

## 2023-11-24 ENCOUNTER — Encounter: Payer: Self-pay | Admitting: Allergy & Immunology

## 2024-03-12 ENCOUNTER — Institutional Professional Consult (permissible substitution) (INDEPENDENT_AMBULATORY_CARE_PROVIDER_SITE_OTHER): Admitting: Otolaryngology

## 2024-03-13 ENCOUNTER — Ambulatory Visit (INDEPENDENT_AMBULATORY_CARE_PROVIDER_SITE_OTHER): Payer: 59 | Admitting: Allergy & Immunology

## 2024-03-13 ENCOUNTER — Encounter: Payer: Self-pay | Admitting: Allergy & Immunology

## 2024-03-13 ENCOUNTER — Other Ambulatory Visit: Payer: Self-pay

## 2024-03-13 VITALS — BP 130/78 | HR 74 | Temp 98.5°F | Ht 64.17 in | Wt 256.2 lb

## 2024-03-13 DIAGNOSIS — J4550 Severe persistent asthma, uncomplicated: Secondary | ICD-10-CM

## 2024-03-13 DIAGNOSIS — J0100 Acute maxillary sinusitis, unspecified: Secondary | ICD-10-CM | POA: Diagnosis not present

## 2024-03-13 DIAGNOSIS — J3089 Other allergic rhinitis: Secondary | ICD-10-CM

## 2024-03-13 DIAGNOSIS — T7800XD Anaphylactic reaction due to unspecified food, subsequent encounter: Secondary | ICD-10-CM

## 2024-03-13 DIAGNOSIS — J302 Other seasonal allergic rhinitis: Secondary | ICD-10-CM

## 2024-03-13 MED ORDER — AMOXICILLIN-POT CLAVULANATE 875-125 MG PO TABS: 1.0000 | ORAL_TABLET | Freq: Two times a day (BID) | ORAL | 0 refills | Status: AC

## 2024-03-13 MED ORDER — PREDNISONE 10 MG PO TABS: 10.0000 mg | ORAL_TABLET | Freq: Two times a day (BID) | ORAL | 0 refills | Status: AC

## 2024-03-13 NOTE — Progress Notes (Unsigned)
   FOLLOW UP  Date of Service/Encounter:  03/13/24   Assessment:   Moderate persistent asthma without complication - with adverse reaction to Xolair  and Dupixent  and Tezspire     Hypertension - on spironolactone, losartan, HCTZ and carvedilol (does NOT see Cardiology, consider referral at the next visit)   Adverse food reactions (peanuts, tree nuts, shrimp, tomato) - with low positives to peanut, tree nuts, tomato   Seasonal and perennial allergic rhinitis (grasses, weeds, trees, mold, roach, and dust mites)    GERD - on Protonix    Sinusitis - sta  Plan/Recommendations:   There are no Patient Instructions on file for this visit.   Subjective:   Jaclyn Haynes is a 53 y.o. female presenting today for follow up of No chief complaint on file.   Jaclyn Haynes has a history of the following: Patient Active Problem List   Diagnosis Date Noted   Angioedema of lips 05/21/2016   Allergic reaction 05/21/2016   Palpitation 08/04/2015   Migraine without aura and without status migrainosus, not intractable 10/27/2014   Dizziness and giddiness 10/27/2013   Disturbance of skin sensation 10/27/2013    History obtained from: chart review and {Persons; PED relatives w/patient:19415::patient}.  Discussed the use of AI scribe software for clinical note transcription with the patient and/or guardian, who gave verbal consent to proceed.  Jaclyn Haynes is a 53 y.o. female presenting for {Blank single:19197::a food challenge,a drug challenge,skin testing,a sick visit,an evaluation of ***,a follow up visit}.  She was last seen in January 2025.  At that time, like testing looked great.  We continue with Breztri  2 puffs twice daily as well as butyryl as needed.  For her rhinitis, we continue with Nasacort  as well as Xyzal .  Started her on Augmentin  and prednisone .  She continue to avoid all of her triggering foods.  EpiPen  is up-to-date.  For her vertigo, we talked about sending her to  see Dr. Karis in the vestibular rehab program.  Since last visit,  Asthma/Respiratory Symptom History: ***  Allergic Rhinitis Symptom History: ***  Food Allergy  Symptom History: ***  Skin Symptom History: ***  GERD Symptom History: ***  Infection Symptom History: ***  Otherwise, there have been no changes to her past medical history, surgical history, family history, or social history.    Review of systems otherwise negative other than that mentioned in the HPI.    Objective:   There were no vitals taken for this visit. There is no height or weight on file to calculate BMI.    Physical Exam   Diagnostic studies:    Spirometry: results normal (FEV1: 1.61/70%, FVC: 2.56/89%, FEV1/FVC: 63%).    Spirometry consistent with mild obstructive disease. {Blank single:19197::Albuterol /Atrovent  nebulizer,Xopenex/Atrovent  nebulizer,Albuterol  nebulizer,Albuterol  four puffs via MDI,Xopenex four puffs via MDI} treatment given in clinic with {Blank single:19197::significant improvement in FEV1 per ATS criteria,significant improvement in FVC per ATS criteria,significant improvement in FEV1 and FVC per ATS criteria,improvement in FEV1, but not significant per ATS criteria,improvement in FVC, but not significant per ATS criteria,improvement in FEV1 and FVC, but not significant per ATS criteria,no improvement}.  Allergy  Studies: {Blank single:19197::none,deferred due to recent antihistamine use,deferred due to insurance stipulations that require a separate visit for testing,labs sent instead, }    {Blank single:19197::Allergy  testing results were read and interpreted by myself, documented by clinical staff., }      Marty Shaggy, MD  [facility]

## 2024-03-13 NOTE — Patient Instructions (Addendum)
 1. Moderate persistent asthma, uncomplicated - Lung testing looks stable today.  - It seems that everything is going well at this point.  - Daily controller medication(s): Breztri  two puffs twice daily with spacer - Prior to physical activity: albuterol  or AirSupra  two puffs 10-15 minutes before physical activity. - Rescue medications: albuterol  or AirSupra  4 puffs every 4-6 hours as needed.  - Asthma control goals:  * Full participation in all desired activities (may need albuterol  before activity) * Albuterol  use two time or less a week on average (not counting use with activity) * Cough interfering with sleep two time or less a month * Oral steroids no more than once a year * No hospitalizations  2. Perennial allergic rhinitis (grasses, weeds, trees, mold, roach, and dust mites) - with overlying sinus infection - Stop the Nasacort  and start Xhance  one spray per nostril daily.  - Continue with Xyzal  5mg  daily (you can use one twice daily if needed).  - Start Augmentin  twice daily for 7 days to clear up this sinus infection. - Start prednisone  taper as well to help it work better.   3. Adverse food reaction (peanuts, tree nuts, shrimp, tomato, strawberry) - Continue to avoid peanuts, tree nuts, shrimp, and tomato. - Anaphylaxis management plan up to date. - EpiPen  is up to date.   5. Vertigo - Keep the appointment in September with Dr. Darlin Ehrlich.  - We will try to figure out a sooner appointment somewhere else.  - To be continued....   6. No follow-ups on file. You can have the follow up appointment with Dr. Idolina Maker or a Nurse Practicioner (our Nurse Practitioners are excellent and always have Physician oversight!).    Please inform us  of any Emergency Department visits, hospitalizations, or changes in symptoms. Call us  before going to the ED for breathing or allergy  symptoms since we might be able to fit you in for a sick visit. Feel free to contact us  anytime with any questions,  problems, or concerns.  It was a pleasure to see you again today!  Websites that have reliable patient information: 1. American Academy of Asthma, Allergy , and Immunology: www.aaaai.org 2. Food Allergy  Research and Education (FARE): foodallergy.org 3. Mothers of Asthmatics: http://www.asthmacommunitynetwork.org 4. American College of Allergy , Asthma, and Immunology: www.acaai.org      "Like" us  on Facebook and Instagram for our latest updates!      A healthy democracy works best when [employer] participate! Make sure you are registered to vote! If you have moved or changed any of your contact information, you will need to get this updated before voting! Scan the QR codes below to learn more!

## 2024-03-18 ENCOUNTER — Encounter: Payer: Self-pay | Admitting: Allergy & Immunology

## 2024-05-03 ENCOUNTER — Other Ambulatory Visit: Payer: Self-pay | Admitting: Allergy & Immunology

## 2024-06-23 ENCOUNTER — Ambulatory Visit (INDEPENDENT_AMBULATORY_CARE_PROVIDER_SITE_OTHER): Admitting: Otolaryngology

## 2024-06-23 ENCOUNTER — Encounter (INDEPENDENT_AMBULATORY_CARE_PROVIDER_SITE_OTHER): Payer: Self-pay | Admitting: Otolaryngology

## 2024-06-23 VITALS — BP 126/77 | HR 72 | Temp 98.7°F | Ht 64.5 in | Wt 255.0 lb

## 2024-06-23 DIAGNOSIS — H608X3 Other otitis externa, bilateral: Secondary | ICD-10-CM | POA: Diagnosis not present

## 2024-06-23 DIAGNOSIS — R42 Dizziness and giddiness: Secondary | ICD-10-CM

## 2024-06-23 MED ORDER — MOMETASONE FUROATE 0.1 % EX CREA: TOPICAL_CREAM | CUTANEOUS | 3 refills | Status: AC

## 2024-06-24 DIAGNOSIS — H608X3 Other otitis externa, bilateral: Secondary | ICD-10-CM | POA: Insufficient documentation

## 2024-06-24 NOTE — Progress Notes (Signed)
 CC: Recurrent dizziness  HPI:  Jaclyn Haynes is a 53 y.o. female who presents today complaining of recurrent dizziness for the past 6 months.  She describes her dizziness as a spinning vertigo that last for several minutes.  In between episodes, she also has frequent off-balance and lightheaded sensation.  She typically has 3-4 episodes of dizziness a week.  She has been self treating with Dramamine.  She has also noted frequent itchy sensation in her ears.  She has occasional right ear pain.  She denies any otorrhea, hearing loss, or tinnitus.  She has no previous ENT surgery.  Past Medical History:  Diagnosis Date   Abnormal Pap smear    CIN1   Allergy     Anemia    Angio-edema    Arthritis    Asthma    Bladder infection    BV (bacterial vaginosis)    Chlamydia    Fibroids    GERD (gastroesophageal reflux disease)    Headache    Migraines   Heart murmur    Hernia    History of hiatal hernia    HSV-2 infection    Hypertension    Irregular heart beat    PONV (postoperative nausea and vomiting)    during gallbladder surgery, almost died per mother   Jul 15, 2003   Vitamin B 12 deficiency    Yeast infection     Past Surgical History:  Procedure Laterality Date   BREAST BIOPSY     BREAST LUMPECTOMY Right    CHOLECYSTECTOMY     COLONOSCOPY     DILATATION & CURETTAGE/HYSTEROSCOPY WITH MYOSURE N/A 02/13/2017   Procedure: DILATATION/HYSTEROSCOPY WITH MYOSURE;  Surgeon: Armond Cape, MD;  Location: [facility];  Service: Gynecology;  Laterality: N/A;  Clint the rep will be here.  Confirmed on 02/08/17 with chass   DILATATION & CURETTAGE/HYSTEROSCOPY WITH MYOSURE N/A 11/20/2022   Procedure: HYSTEROSCOPY WITH MYOSURE;  Surgeon: Armond Cape, MD;  Location: [facility];  Service: Gynecology;  Laterality: N/A;   DILITATION & CURRETTAGE/HYSTROSCOPY WITH HYDROTHERMAL ABLATION N/A 11/20/2022   Procedure: DILATATION & CURETTAGE/HYSTEROSCOPY WITH ATTEMPTED  HYDROTHERMAL ABLATION;  Surgeon: Armond Cape, MD;  Location: [facility];  Service: Gynecology;  Laterality: N/A;   HYSTEROSCOPY N/A 02/13/2017   Procedure: ATTEMPTED HYDROTHERMAL ABLATION;  Surgeon: Armond Cape, MD;  Location: [facility];  Service: Gynecology;  Laterality: N/A;  HTA rep will be here     Family History  Problem Relation Age of Onset   Stroke Mother    Hypertension Mother    COPD Mother    Thyroid  nodules Mother    Hyperlipidemia Mother    Eczema Mother    Heart disease Father    Hypertension Father    Hyperlipidemia Father    Hypertension Maternal Grandmother    Hyperlipidemia Maternal Grandmother    Diabetes Maternal Grandmother    Thyroid  nodules Maternal Grandmother    Hypertension Maternal Grandfather    Hyperlipidemia Maternal Grandfather    Prostate cancer Maternal Grandfather    Hypertension Paternal Grandmother    Heart disease Paternal Grandfather    Hypertension Paternal Grandfather    Eczema Brother    Allergic rhinitis Neg Hx    Angioedema Neg Hx    Asthma Neg Hx    Immunodeficiency Neg Hx    Urticaria Neg Hx     Social History:  reports that she has never smoked. She has never been exposed to tobacco smoke. She has never used smokeless tobacco. She reports current alcohol use. She  reports that she does not use drugs.  Allergies:  Allergies  Allergen Reactions   Peanut Oil Anaphylaxis    Other reaction(s): Angioedema   Peanut-Containing Drug Products Anaphylaxis and Hives   Shellfish Allergy  Anaphylaxis and Rash   Betadine  [Povidone Iodine ] Other (See Comments)    Unsure of reaction, but pt states she may have had an issue with this in the past   Pollen Extract    Tomato Hives    Only raw tomato causes reaction   Bee Venom Hives, Swelling and Rash    Swelling , more than localized   Other Other (See Comments)    Allergy  Testing  Acidic fruits and vegetables, rash     Prior to Admission medications   Medication Sig Start Date End Date Taking? Authorizing Provider  albuterol   (VENTOLIN  HFA) 108 (90 Base) MCG/ACT inhaler Inhale 2 puffs into the lungs every 6 (six) hours as needed for wheezing. 09/13/23  Yes Iva Marty Saltness, MD  Albuterol -Budesonide  (AIRSUPRA ) 90-80 MCG/ACT AERO Inhale 2 puffs into the lungs every 4 (four) hours as needed. 03/08/23  Yes Iva Marty Saltness, MD  ASHLYNA 0.15-0.03 &0.01 MG tablet Take 1 tablet by mouth daily. 07/02/16  Yes [provider]  carvedilol (COREG) 25 MG tablet Take 25 mg by mouth 2 (two) times daily. 11/23/21  Yes [provider]  Clindamycin-Benzoyl Per, Refr, gel Apply 1 Application topically at bedtime. 05/05/19  Yes [provider]  diphenhydrAMINE  (BENADRYL ) 25 MG tablet Take 25 mg by mouth every 6 (six) hours as needed for allergies.   Yes [provider]  EPINEPHrine  (AUVI-Q ) 0.3 mg/0.3 mL IJ SOAJ injection Inject 0.3 mg into the muscle as needed for anaphylaxis. 03/08/23  Yes Iva Marty Saltness, MD  GLUCOSAMINE-CHONDROITIN PO Take 1 tablet by mouth daily.   Yes [provider]  hydrochlorothiazide (HYDRODIURIL) 12.5 MG tablet Take 12.5 mg by mouth as needed.   Yes [provider]  levocetirizine (XYZAL ) 5 MG tablet TAKE 1 TABLET(5 MG) BY MOUTH EVERY EVENING 05/05/24  Yes Iva Marty Saltness, MD  levonorgestrel-ethinyl estradiol  (NORDETTE) 0.15-30 MG-MCG tablet Take 1 tablet by mouth daily. 05/14/23  Yes [provider]  losartan (COZAAR) 25 MG tablet Take 25 mg by mouth daily.   Yes [provider]  methocarbamol (ROBAXIN) 500 MG tablet Take 500 mg by mouth every 6 (six) hours as needed for muscle spasms. 06/14/23  Yes [provider]  mometasone  (ELOCON ) 0.1 % cream Apply topically daily as needed for itch 06/23/24  Yes Karis Clunes, MD  montelukast  (SINGULAIR ) 10 MG tablet Take one tablet daily in the evening. 03/08/23  Yes Iva Marty Saltness, MD  pantoprazole  (PROTONIX ) 40 MG tablet Take 1 tablet (40 mg total) by mouth daily. 03/08/23 06/23/24  Yes Iva Marty Saltness, MD  Probiotic Product (PROBIOTIC DAILY) CAPS Hold until Recommended to take by allergy  specialist. Patient taking differently: Take 1 capsule by mouth daily. 05/24/16  Yes Patel, Pranav M, MD  spironolactone (ALDACTONE) 25 MG tablet Take 25 mg by mouth daily. 05/05/19  Yes [provider]  topiramate  (TOPAMAX ) 25 MG tablet Take 25 mg by mouth as needed. 09/12/23  Yes [provider]  traMADol (ULTRAM) 50 MG tablet Take 50 mg by mouth every 6 (six) hours as needed for moderate pain (pain score 4-6).   Yes [provider]  tretinoin (RETIN-A) 0.025 % cream Apply a pea-sized amount to entire face every other night to nightly. 05/05/19  Yes [provider]  valACYclovir  (VALTREX ) 500 MG tablet Take 1 tablet (500 mg total) by mouth daily. Hold until Recommended to take by allergy  specialist. 05/24/16  Yes Tobie Yetta HERO, MD  vitamin B-12 (CYANOCOBALAMIN) 1000 MCG tablet Take 1,000 mcg by mouth daily.   Yes [provider]  fluconazole  (DIFLUCAN ) 150 MG tablet Take one tablet and then repeat in one week if needed. Patient not taking: Reported on 06/23/2024 10/02/23   Iva Marty Saltness, MD    Blood pressure 126/77, pulse 72, temperature 98.7 F (37.1 C), temperature source Oral, height 5' 4.5 (1.638 m), weight 255 lb (115.7 kg), SpO2 96%. Exam: General: Communicates without difficulty, well nourished, no acute distress. Head: Normocephalic, no evidence injury, no tenderness, facial buttresses intact without stepoff. Face/sinus: No tenderness to palpation and percussion. Facial movement is normal and symmetric. Eyes: PERRL, EOMI. No scleral icterus, conjunctivae clear. Neuro: CN II exam reveals vision grossly intact.  No nystagmus at any point of gaze. Ears: Auricles well formed without lesions.  Ear canals are intact with eczematous changes.  No erythema or edema is appreciated.  The TMs are intact without fluid. Nose: External  evaluation reveals normal support and skin without lesions.  Dorsum is intact.  Anterior rhinoscopy reveals congested mucosa over anterior aspect of inferior turbinates and intact septum.  No purulence noted. Oral:  Oral cavity and oropharynx are intact, symmetric, without erythema or edema.  Mucosa is moist without lesions. Neck: Full range of motion without pain.  There is no significant lymphadenopathy.  No masses palpable.  Thyroid  bed within normal limits to palpation.  Parotid glands and submandibular glands equal bilaterally without mass.  Trachea is midline. Neuro:  CN 2-12 grossly intact. Vestibular: No nystagmus at any point of gaze. Dix Hallpike negative.  Vestibular: There is no nystagmus with pneumatic pressure on either tympanic membrane or Valsalva. The cerebellar examination is unremarkable.    Assessment: 1.  Bilateral chronic eczematous otitis externa. 2.  Recurrent dizziness of unknown etiology. The possible differential diagnoses include transient BPPV, vestibular migraine, Meniere's disease, peripheral vestibular dysfunction, or other central/systemic causes.   3.  Her tympanic membranes and middle ear spaces are normal.  No middle ear effusion or infection is noted.  Her Dix-Hallpike maneuver is negative.  Plan: 1.  The physical exam findings are reviewed with the patient. 2.  The pathophysiology of vestibular dysfunction and dizziness are discussed extensively with the patient. The possible differential diagnoses are reviewed. Questions are invited and answered.  3.  Elocon  cream to treat the chronic eczematous otitis externa. 4.  The patient will likely benefit from undergoing physical therapy/vestibular rehabilitation to improve the balancing function. A referral will be arranged as soon as possible.  5.  If the patient continues to be symptomatic, she may benefit from vestibular neurodiagnostic testing at a tertiary care center to evaluate for possible vestibular dysfunction.    Dysen Edmondson W Algernon Mundie 06/24/2024, 11:20 AM

## 2024-07-30 ENCOUNTER — Ambulatory Visit: Admitting: Family Medicine

## 2024-08-14 ENCOUNTER — Ambulatory Visit: Attending: Otolaryngology | Admitting: Physical Therapy

## 2024-08-14 DIAGNOSIS — R2681 Unsteadiness on feet: Secondary | ICD-10-CM | POA: Diagnosis present

## 2024-08-14 DIAGNOSIS — R42 Dizziness and giddiness: Secondary | ICD-10-CM | POA: Diagnosis present

## 2024-08-14 NOTE — Therapy (Signed)
 OUTPATIENT PHYSICAL THERAPY VESTIBULAR EVALUATION     Patient Name: Jaclyn Haynes MRN: [id number] DOB:[birth year], 53 y.o., female Today's Date: 08/15/2024  END OF SESSION:  PT End of Session - 08/15/24 1518     Visit Number 1    Number of Visits 5    Date for Recertification  09/12/24    Authorization Type [employer]    PT Start Time 0800    PT Stop Time 0846    PT Time Calculation (min) 46 min    Activity Tolerance Patient tolerated treatment well    Behavior During Therapy WFL for tasks assessed/performed          Past Medical History:  Diagnosis Date   Abnormal Pap smear    CIN1   Allergy     Anemia    Angio-edema    Arthritis    Asthma    Bladder infection    BV (bacterial vaginosis)    Chlamydia    Fibroids    GERD (gastroesophageal reflux disease)    Headache    Migraines   Heart murmur    Hernia    History of hiatal hernia    HSV-2 infection    Hypertension    Irregular heart beat    PONV (postoperative nausea and vomiting)    during gallbladder surgery, almost died per mother   2003/09/01   Vitamin B 12 deficiency    Yeast infection    Past Surgical History:  Procedure Laterality Date   BREAST BIOPSY     BREAST LUMPECTOMY Right    CHOLECYSTECTOMY     COLONOSCOPY     DILATATION & CURETTAGE/HYSTEROSCOPY WITH MYOSURE N/A 02/13/2017   Procedure: DILATATION/HYSTEROSCOPY WITH MYOSURE;  Surgeon: Armond Cape, MD;  Location: [facility];  Service: Gynecology;  Laterality: N/A;  Clint the rep will be here.  Confirmed on 02/08/17 with chass   DILATATION & CURETTAGE/HYSTEROSCOPY WITH MYOSURE N/A 11/20/2022   Procedure: HYSTEROSCOPY WITH MYOSURE;  Surgeon: Armond Cape, MD;  Location: [facility];  Service: Gynecology;  Laterality: N/A;   DILITATION & CURRETTAGE/HYSTROSCOPY WITH HYDROTHERMAL ABLATION N/A 11/20/2022   Procedure: DILATATION & CURETTAGE/HYSTEROSCOPY WITH ATTEMPTED  HYDROTHERMAL ABLATION;  Surgeon: Armond Cape, MD;  Location: [facility];  Service: Gynecology;   Laterality: N/A;   HYSTEROSCOPY N/A 02/13/2017   Procedure: ATTEMPTED HYDROTHERMAL ABLATION;  Surgeon: Armond Cape, MD;  Location: [facility];  Service: Gynecology;  Laterality: N/A;  HTA rep will be here    Patient Active Problem List   Diagnosis Date Noted   Chronic eczematous otitis externa of both ears 06/24/2024   Angioedema of lips 05/21/2016   Allergic reaction 05/21/2016   Palpitation 08/04/2015   Migraine without aura and without status migrainosus, not intractable 10/27/2014   Dizziness 10/27/2013   Disturbance of skin sensation 10/27/2013    PCP: Alvera Reagin, PA REFERRING PROVIDER: Karis Clunes, MD  REFERRING DIAG: R42 (ICD-10-CM) - Dizziness  THERAPY DIAG:  Dizziness and giddiness - Plan: PT plan of care cert/re-cert  Unsteadiness on feet - Plan: PT plan of care cert/re-cert  ONSET DATE: Referral date 06-24-24  Rationale for Evaluation and Treatment: Rehabilitation  SUBJECTIVE:   SUBJECTIVE STATEMENT: Pt reports she is taking Dramamine for relief but it is no longer working as well as it did initially.  Dizziness is getting worse so she saw Dr. Karis (end of Sept) - no etiology of dizziness was found at that appt.  Pt says dizziness has progressively gotten worse within past 1 1/2 years. Pt says she  has motion sensitivity but driving does not bother her; has light sensitivity; noise sensitivity with extremely loud noises only.  Pt does have h/o migraines Pt accompanied by: self  PERTINENT HISTORY: h/o migraines  PAIN:  Are you having pain? No; reports mild HA  PRECAUTIONS: None  RED FLAGS: None   WEIGHT BEARING RESTRICTIONS: No  FALLS: Has patient fallen in last 6 months? No   PLOF: Independent  PATIENT GOALS: unsure - is here per Dr. Rojean referral   OBJECTIVE:  Note: Objective measures were completed at Evaluation unless otherwise noted.  DIAGNOSTIC FINDINGS: none since 2020 (MRI was normal)  COGNITION: Overall cognitive status: Within functional  limits for tasks assessed   GAIT: Gait pattern: [employer] Distance walked: 27' Assistive device utilized: None Level of assistance: Complete Independence  VESTIBULAR ASSESSMENT:  GENERAL OBSERVATION: pt amb. Independently without device - has h/o migraines; migraines were previously managed by Dr. Lindy but he moved several years ago - pt states she is still on Topamax     SYMPTOM BEHAVIOR:  Subjective history: pt reports dizziness for several years; says it has progressively gotten worse within past 1 - 1.5 yrs so she saw Dr. Karis; no etiology was found at that appt.  Non-Vestibular symptoms: changes in hearing, headaches, migraine symptoms, and migraines cause nausea; some piercing pain  Type of dizziness: Imbalance (Disequilibrium), Unsteady with head/body turns, Funny feeling in the head, and h/o spinning but is rare - occurred with migraine  Frequency: varies  Duration: varies - lasts for hours  Aggravating factors: Spontaneous, Induced by motion: turning body quickly and turning head quickly, Worse outside or in busy environment, and dairy products  Relieving factors: head stationary, lying supine, medication, dark room, and slow movements  Progression of symptoms: worse - says more things are impacting it  OCULOMOTOR EXAM:  Ocular Alignment: normal  Ocular ROM: No Limitations  Spontaneous Nystagmus: absent  Gaze-Induced Nystagmus: absent  Smooth Pursuits: intact  Saccades: intact   FRENZEL - FIXATION SUPRESSED:  Ocular Alignment: normal  Spontaneous Nystagmus: absent  Gaze-Induced Nystagmus: absent   VESTIBULAR - OCULAR REFLEX:    Dynamic Visual Acuity: Static: line 11 Dynamic: line 10 C/o dizziness upon completion of test   POSITIONAL TESTING: Other: NT based on subjective history  MOTION SENSITIVITY:  Motion Sensitivity Quotient Intensity: 0 = none, 1 = Lightheaded, 2 = Mild, 3 = Moderate, 4 = Severe, 5 = Vomiting  Intensity  1. Sitting to supine   2. Supine to L  side   3. Supine to R side   4. Supine to sitting   5. L Hallpike-Dix   6. Up from L    7. R Hallpike-Dix   8. Up from R    9. Sitting, head tipped to L knee   10. Head up from L knee   11. Sitting, head tipped to R knee   12. Head up from R knee   13. Sitting head turns x5   14.Sitting head nods x5   15. In stance, 180 turn to L    16. In stance, 180 turn to R     OTHOSTATICS: not done  FUNCTIONAL GAIT: MCTSIB: Condition 1: Avg of 3 trials: 30 sec, Condition 2: Avg of 3 trials: 30 sec, Condition 3: Avg of 3 trials: 30 sec, Condition 4: Avg of 3 trials: 30 sec, and Total Score: 120/120  TREATMENT DATE: 08-14-24  Gaze Adaptation:  x1 Viewing Horizontal: Position: standing, Time: 30, Reps: 1, and Comment: min c/o dizziness and x1 Viewing Vertical:  Position: standing, Time: 30, Reps: 1, and Comment: min. C/o dizziness  PATIENT EDUCATION: Education details: pt instructed in HEP - x1 viewing and balance on foam; also given pt handout from [facility] on Migraines and Dizziness and hydrops and Meniere's Person educated: Patient Education method: Explanation, Demonstration, and Handouts Education comprehension: verbalized understanding and returned demonstration  HOME EXERCISE PROGRAM:  see below  Access Code: [email or web address] URL: [email or web address] Date: 08/15/2024 Prepared by: Rock Kussmaul  Exercises - Standing Gaze Stabilization with Head Rotation  - 3 x daily - 7 x weekly - 1 sets - 1 reps - 30 secs hold - Standing on foam pad - Eyes open and then with Eyes closed   - 2 x daily - 7 x weekly - 1 sets - 1 reps - 30 sec hold  GOALS: Goals reviewed with patient? Yes  SHORT TERM GOALS: same as LTG's   LONG TERM GOALS: Target date: 09-12-24  Pt will be independent with HEP for balance and gaze stabilization. Baseline:  Goal status: INITIAL  2.   Pt will verbalize understanding of diet recommendations related to Hydrops and migraines.   Baseline:  Goal status: INITIAL   ASSESSMENT:  CLINICAL IMPRESSION: Patient is a 53 y.o. lady who was seen today for physical therapy evaluation and treatment for dizziness of unknown etiology.  Pt's symptoms appear to be related to vestibular migraines.  Pt reports dizziness with quick head and body turns.  Pt reports she has headaches as well as noise and light sensitivity.  Pt's DVA is WNL's with a 1 line difference (line 11/line 10) with c/o dizziness upon completion of test.  Oculmotor testing (smooth pursuits and saccades) WNL's with pt reporting dizziness would be provoked if testing continued.  Pt able to stand for 30 secs on all 4 conditions of mCTSIB, indicative of vestibular input in maintaining balance.  Pt would benefit from a neurology referral to discuss medication management for headaches/migraines and for further assessment of dizziness.    OBJECTIVE IMPAIRMENTS: decreased balance and dizziness.   ACTIVITY LIMITATIONS: N/A  PARTICIPATION LIMITATIONS: N/A  PERSONAL FACTORS: Past/current experiences, Time since onset of injury/illness/exacerbation, and h/o migraines are also affecting patient's functional outcome.   REHAB POTENTIAL: Fair due to unknown etiology of dizziness  CLINICAL DECISION MAKING: Evolving/moderate complexity  EVALUATION COMPLEXITY: Moderate   PLAN:  PT FREQUENCY: 1x/week  PT DURATION: 4 weeks + eval  PLANNED INTERVENTIONS: 97110-Therapeutic exercises, 97530- Therapeutic activity, [id number]- Neuromuscular re-education, and [id number]- Self Care  PLAN FOR NEXT SESSION: check HEP for any questions/issues; do SOT?   Kussmaul [place], PT 08/15/2024, 4:30 PM

## 2024-08-15 ENCOUNTER — Encounter: Payer: Self-pay | Admitting: Physical Therapy

## 2024-09-04 ENCOUNTER — Other Ambulatory Visit: Payer: Self-pay | Admitting: Allergy & Immunology

## 2024-09-04 ENCOUNTER — Ambulatory Visit: Payer: Self-pay | Attending: Otolaryngology | Admitting: Physical Therapy

## 2024-09-04 DIAGNOSIS — R2681 Unsteadiness on feet: Secondary | ICD-10-CM | POA: Diagnosis present

## 2024-09-04 DIAGNOSIS — R42 Dizziness and giddiness: Secondary | ICD-10-CM | POA: Diagnosis present

## 2024-09-04 NOTE — Therapy (Unsigned)
 OUTPATIENT PHYSICAL THERAPY VESTIBULAR TREATMENT NOTE     Patient Name: Jaclyn Haynes MRN: [id number] DOB:August 24, 1971, 53 y.o., female Today's Date: 09/07/2024  END OF SESSION:  PT End of Session - 09/07/24 1357     Visit Number 2    Number of Visits 5    Date for Recertification  09/12/24    Authorization Type [employer]    Authorization - Visit Number 2    Authorization - Number of Visits 90    PT Start Time 0801    PT Stop Time 0845    PT Time Calculation (min) 44 min    Equipment Utilized During Treatment Other (comment)   harness vest used with SOT   Activity Tolerance Patient tolerated treatment well    Behavior During Therapy WFL for tasks assessed/performed           Past Medical History:  Diagnosis Date   Abnormal Pap smear    CIN1   Allergy     Anemia    Angio-edema    Arthritis    Asthma    Bladder infection    BV (bacterial vaginosis)    Chlamydia    Fibroids    GERD (gastroesophageal reflux disease)    Headache    Migraines   Heart murmur    Hernia    History of hiatal hernia    HSV-2 infection    Hypertension    Irregular heart beat    PONV (postoperative nausea and vomiting)    during gallbladder surgery, almost died per mother   09-28-2003   Vitamin B 12 deficiency    Yeast infection    Past Surgical History:  Procedure Laterality Date   BREAST BIOPSY     BREAST LUMPECTOMY Right    CHOLECYSTECTOMY     COLONOSCOPY     DILATATION & CURETTAGE/HYSTEROSCOPY WITH MYOSURE N/A 02/13/2017   Procedure: DILATATION/HYSTEROSCOPY WITH MYOSURE;  Surgeon: Armond Cape, MD;  Location: [facility];  Service: Gynecology;  Laterality: N/A;  Clint the rep will be here.  Confirmed on 02/08/17 with chass   DILATATION & CURETTAGE/HYSTEROSCOPY WITH MYOSURE N/A 11/20/2022   Procedure: HYSTEROSCOPY WITH MYOSURE;  Surgeon: Armond Cape, MD;  Location: [facility];  Service: Gynecology;  Laterality: N/A;   DILITATION & CURRETTAGE/HYSTROSCOPY WITH HYDROTHERMAL ABLATION N/A  11/20/2022   Procedure: DILATATION & CURETTAGE/HYSTEROSCOPY WITH ATTEMPTED  HYDROTHERMAL ABLATION;  Surgeon: Armond Cape, MD;  Location: [facility];  Service: Gynecology;  Laterality: N/A;   HYSTEROSCOPY N/A 02/13/2017   Procedure: ATTEMPTED HYDROTHERMAL ABLATION;  Surgeon: Armond Cape, MD;  Location: [facility];  Service: Gynecology;  Laterality: N/A;  HTA rep will be here    Patient Active Problem List   Diagnosis Date Noted   Chronic eczematous otitis externa of both ears 06/24/2024   Angioedema of lips 05/21/2016   Allergic reaction 05/21/2016   Palpitation 08/04/2015   Migraine without aura and without status migrainosus, not intractable 10/27/2014   Dizziness 10/27/2013   Disturbance of skin sensation 10/27/2013    PCP: Alvera Reagin, PA REFERRING PROVIDER: Karis Clunes, MD  REFERRING DIAG: R42 (ICD-10-CM) - Dizziness  THERAPY DIAG:  Dizziness and giddiness  Unsteadiness on feet  ONSET DATE: Referral date 06-24-24  Rationale for Evaluation and Treatment: Rehabilitation  SUBJECTIVE:   SUBJECTIVE STATEMENT: Pt reports she has had more of a bout of dizziness these past 2 days - was at work and had some dizzinesss. Had another occurrence last night and was unable to finish paperwork.  Pt reports she has  not made an appt with neurology yet but plans to call to schedule one today  Pt accompanied by: self  PERTINENT HISTORY: h/o migraines  PAIN:  Are you having pain? No; reports mild HA  PRECAUTIONS: None  RED FLAGS: None   WEIGHT BEARING RESTRICTIONS: No  FALLS: Has patient fallen in last 6 months? No   PLOF: Independent  PATIENT GOALS: unsure - is here per Dr. Rojean referral   OBJECTIVE:  Note: Objective measures were completed at Evaluation unless otherwise noted.  DIAGNOSTIC FINDINGS: none since 2020 (MRI was normal)  COGNITION: Overall cognitive status: Within functional limits for tasks assessed   GAIT: Gait pattern: [facility] Distance walked: 36' Assistive  device utilized: None Level of assistance: Complete Independence  VESTIBULAR ASSESSMENT:  GENERAL OBSERVATION: pt amb. Independently without device - has h/o migraines; migraines were previously managed by Dr. Lindy but he moved several years ago - pt states she is still on Topamax     SYMPTOM BEHAVIOR:  Subjective history: pt reports dizziness for several years; says it has progressively gotten worse within past 1 - 1.5 yrs so she saw Dr. Karis; no etiology was found at that appt.  Non-Vestibular symptoms: changes in hearing, headaches, migraine symptoms, and migraines cause nausea; some piercing pain  Type of dizziness: Imbalance (Disequilibrium), Unsteady with head/body turns, Funny feeling in the head, and h/o spinning but is rare - occurred with migraine  Frequency: varies  Duration: varies - lasts for hours  Aggravating factors: Spontaneous, Induced by motion: turning body quickly and turning head quickly, Worse outside or in busy environment, and dairy products  Relieving factors: head stationary, lying supine, medication, dark room, and slow movements  Progression of symptoms: worse - says more things are impacting it  OCULOMOTOR EXAM:  Ocular Alignment: normal  Ocular ROM: No Limitations  Spontaneous Nystagmus: absent  Gaze-Induced Nystagmus: absent  Smooth Pursuits: intact  Saccades: intact   FRENZEL - FIXATION SUPRESSED:  Ocular Alignment: normal  Spontaneous Nystagmus: absent  Gaze-Induced Nystagmus: absent   VESTIBULAR - OCULAR REFLEX:    Dynamic Visual Acuity: Static: line 11 Dynamic: line 10 C/o dizziness upon completion of test   POSITIONAL TESTING: Other: NT based on subjective history  MOTION SENSITIVITY:  Motion Sensitivity Quotient Intensity: 0 = none, 1 = Lightheaded, 2 = Mild, 3 = Moderate, 4 = Severe, 5 = Vomiting  Intensity  1. Sitting to supine   2. Supine to L side   3. Supine to R side   4. Supine to sitting   5. L Hallpike-Dix   6. Up from L     7. R Hallpike-Dix   8. Up from R    9. Sitting, head tipped to L knee   10. Head up from L knee   11. Sitting, head tipped to R knee   12. Head up from R knee   13. Sitting head turns x5   14.Sitting head nods x5   15. In stance, 180 turn to L    16. In stance, 180 turn to R     OTHOSTATICS: not done  FUNCTIONAL GAIT: MCTSIB: Condition 1: Avg of 3 trials: 30 sec, Condition 2: Avg of 3 trials: 30 sec, Condition 3: Avg of 3 trials: 30 sec, Condition 4: Avg of 3 trials: 30 sec, and Total Score: 120/120  TREATMENT DATE: 09-04-24  TherAct:  Sensory Organization Test: Composite score 69/100;  N=70/100  Somatosensory and visual inputs WNL's:  vestibular input 30/100:  N=55/100  Condition 1 - all 3 trials WNL's Condition 2 - all 3 trials WNL's Condition 3 - trials 1 & 3 WNL's:  trial 2 slightly decreased from normal Condition 4 - all 3 trials WNL's Condition 5 - FALL on trials 1 & 2:  trial 3 WNL's Condition 6 - FALL on trial 1: WNL's trials 2 & 3  Standing Balance: Surface: Airex Position: Feet Hip Width Apart Completed with: Eyes Open and Eyes Closed; Head Turns x 5 Reps and Head Nods x 5 Reps  Self Care: Results of SOT discussed with patient;  pt reported mild increase in symptoms after completion of SOT Copy of SOT given to patient  Reviewed HEP with pt (verbally)  PATIENT EDUCATION: Education details: pt instructed in HEP - x1 viewing and balance on foam; also given pt handout from [facility] on Migraines and Dizziness and hydrops and Meniere's Person educated: Patient Education method: Explanation, Demonstration, and Handouts Education comprehension: verbalized understanding and returned demonstration  HOME EXERCISE PROGRAM:  see below  Access Code: [email or web address] URL: [email or web address] Date: 08/15/2024 Prepared by: Rock Kussmaul  Exercises - Standing Gaze Stabilization with Head Rotation  - 3 x daily - 7 x weekly - 1 sets - 1 reps - 30 secs hold - Standing on foam pad - Eyes open and then with Eyes closed   - 2 x daily - 7 x weekly - 1 sets - 1 reps - 30 sec hold  GOALS: Goals reviewed with patient? Yes  SHORT TERM GOALS: same as LTG's   LONG TERM GOALS: Target date: 09-12-24  Pt will be independent with HEP for balance and gaze stabilization. Baseline:  Goal status: INITIAL  2.  Pt will verbalize understanding of diet recommendations related to Hydrops and migraines.   Baseline:  Goal status: INITIAL   ASSESSMENT:  CLINICAL IMPRESSION: Pt's SOT composite score = 69/100 with N= 70/100.  Pt had fall on condition 5, trials 1 & 2 and on trial 1 of condition 6.  Somatosensory and visual inputs WNL's with vestibular input approx. 50% decreased from normal (score 30/100 with N= 55/100); SOT results indicative of vestibular hypofunction.  Pt most challenged by maintaining balance on Airex with EC.  Cont with POC.     OBJECTIVE IMPAIRMENTS: decreased balance and dizziness.   ACTIVITY LIMITATIONS: N/A  PARTICIPATION LIMITATIONS: N/A  PERSONAL FACTORS: Past/current experiences, Time since onset of injury/illness/exacerbation, and h/o migraines are also affecting patient's functional outcome.   REHAB POTENTIAL: Fair due to unknown etiology of dizziness  CLINICAL DECISION MAKING: Evolving/moderate complexity  EVALUATION COMPLEXITY: Moderate   PLAN:  PT FREQUENCY: 1x/week  PT DURATION: 4 weeks + eval  PLANNED INTERVENTIONS: 97110-Therapeutic exercises, 97530- Therapeutic activity, [id number]- Neuromuscular re-education, and 02464- Self Care  PLAN FOR NEXT SESSION:  continue balance/vestibular exercises; neurology appt?    Kussmaul [place], PT 09/07/2024, 1:59 PM

## 2024-09-16 ENCOUNTER — Encounter: Payer: Self-pay | Admitting: Neurology

## 2024-09-23 ENCOUNTER — Ambulatory Visit: Admitting: Physical Therapy

## 2024-10-02 ENCOUNTER — Ambulatory Visit: Admitting: Physical Therapy

## 2024-10-03 ENCOUNTER — Ambulatory Visit: Admitting: Nurse Practitioner

## 2024-10-03 ENCOUNTER — Encounter: Payer: Self-pay | Admitting: Nurse Practitioner

## 2024-10-03 VITALS — BP 124/70 | HR 78 | Temp 97.9°F | Ht 64.5 in | Wt 255.5 lb

## 2024-10-03 DIAGNOSIS — E559 Vitamin D deficiency, unspecified: Secondary | ICD-10-CM | POA: Diagnosis not present

## 2024-10-03 DIAGNOSIS — Z6841 Body Mass Index (BMI) 40.0 and over, adult: Secondary | ICD-10-CM

## 2024-10-03 DIAGNOSIS — G43009 Migraine without aura, not intractable, without status migrainosus: Secondary | ICD-10-CM

## 2024-10-03 DIAGNOSIS — M2550 Pain in unspecified joint: Secondary | ICD-10-CM | POA: Diagnosis not present

## 2024-10-03 DIAGNOSIS — R0982 Postnasal drip: Secondary | ICD-10-CM | POA: Insufficient documentation

## 2024-10-03 LAB — COMPREHENSIVE METABOLIC PANEL WITH GFR
ALT: 12 U/L (ref 3–35)
AST: 15 U/L (ref 5–37)
Albumin: 4.1 g/dL (ref 3.5–5.2)
Alkaline Phosphatase: 47 U/L (ref 39–117)
BUN: 13 mg/dL (ref 6–23)
CO2: 25 meq/L (ref 19–32)
Calcium: 9.3 mg/dL (ref 8.4–10.5)
Chloride: 106 meq/L (ref 96–112)
Creatinine, Ser: 0.77 mg/dL (ref 0.40–1.20)
GFR: 87.83 mL/min
Glucose, Bld: 119 mg/dL — ABNORMAL HIGH (ref 70–99)
Potassium: 3.7 meq/L (ref 3.5–5.1)
Sodium: 140 meq/L (ref 135–145)
Total Bilirubin: 0.7 mg/dL (ref 0.2–1.2)
Total Protein: 7.5 g/dL (ref 6.0–8.3)

## 2024-10-03 LAB — CBC
HCT: 38.5 % (ref 36.0–46.0)
Hemoglobin: 12.7 g/dL (ref 12.0–15.0)
MCHC: 33.1 g/dL (ref 30.0–36.0)
MCV: 88.8 fl (ref 78.0–100.0)
Platelets: 272 K/uL (ref 150.0–400.0)
RBC: 4.33 Mil/uL (ref 3.87–5.11)
RDW: 13.5 % (ref 11.5–15.5)
WBC: 7.6 K/uL (ref 4.0–10.5)

## 2024-10-03 LAB — C-REACTIVE PROTEIN: CRP: <0.5 mg/dL — ABNORMAL LOW (ref 1.0–20.0)

## 2024-10-03 LAB — SEDIMENTATION RATE: Sed Rate: 24 mm/h (ref 0–30)

## 2024-10-03 MED ORDER — MONTELUKAST SODIUM 10 MG PO TABS: ORAL_TABLET | ORAL | 1 refills | Status: AC

## 2024-10-03 MED ORDER — LEVOCETIRIZINE DIHYDROCHLORIDE 5 MG PO TABS: 5.0000 mg | ORAL_TABLET | Freq: Every evening | ORAL | 1 refills | Status: AC

## 2024-10-03 NOTE — Progress Notes (Signed)
 "  New Patient Office Visit  Subjective    Patient ID: Jaclyn Haynes, female    DOB: Aug 23, 1971  Age: 54 y.o. MRN: [id number]  CC:  Chief Complaint  Patient presents with   New Patient (Initial Visit)    Establish care, on and off nasal drip for 6 month    HPI Jaclyn Haynes presents to establish care Discussed the use of AI scribe software for clinical note transcription with the patient, who gave verbal consent to proceed.  History of Present Illness Jaclyn Haynes is a 54 year old female who presents for a new patient visit.  Postnasal drip and allergic symptoms - Postnasal drip, chronic and more noticeable worsened over the last 6 months.  P - History of multiple food allergies as well as environmental allergies and asthma. Sees. Dr. Iva yearly for asthma management.  - Uses daily Xyzal  and as needed Benadryl  for allergies - Has not been taking Singulair  - Sees allergist yearly for asthma   Migraine headaches - Chronic, frequency of migraines is inconsistent but can occur as often as 4-5 days/week - Pain is severe enough to limit daily activities - Recently restarted topiramate  25 mg twice daily, plans to increase to 50 mg once per day. She is taking this for migraine prevention but also for off-label benefit of possible weight loss.  - Has experienced tingling and numbness at higher doses of topiramate  (has taken up to 100mg /day in the past for migraine prevention)  Arthralgia and back pain - Significant hand and knee pain. Worse in the morning, improves during the day with movement - Lower back pain worse at night and in the morning - Symptoms fluctuate but have recently increased - No diagnosis of autoimmune disease - No family history of rheumatoid arthritis  Laboratory findings and supplementation - Labs collected at [facility] last month identified vitamin D of 11, low-normal B12, crt of 0.75, egfr 95, A1C 5.3, normal lipid  panel     Outpatient Encounter Medications as of 10/03/2024  Medication Sig   albuterol  (VENTOLIN  HFA) 108 (90 Base) MCG/ACT inhaler Inhale 2 puffs into the lungs every 6 (six) hours as needed for wheezing.   Albuterol -Budesonide  (AIRSUPRA ) 90-80 MCG/ACT AERO Inhale 2 puffs into the lungs every 4 (four) hours as needed.   Jaclyn Haynes 0.15-0.03 &0.01 MG tablet Take 1 tablet by mouth daily.   carvedilol (COREG) 25 MG tablet Take 25 mg by mouth 2 (two) times daily.   Clindamycin-Benzoyl Per, Refr, gel Apply 1 Application topically at bedtime.   diphenhydrAMINE  (BENADRYL ) 25 MG tablet Take 25 mg by mouth every 6 (six) hours as needed for allergies.   EPINEPHrine  (AUVI-Q ) 0.3 mg/0.3 mL IJ SOAJ injection Inject 0.3 mg into the muscle as needed for anaphylaxis.   GLUCOSAMINE-CHONDROITIN PO Take 1 tablet by mouth daily.   hydrochlorothiazide (HYDRODIURIL) 12.5 MG tablet Take 12.5 mg by mouth as needed.   levonorgestrel-ethinyl estradiol  (NORDETTE) 0.15-30 MG-MCG tablet Take 1 tablet by mouth daily.   losartan (COZAAR) 25 MG tablet Take 25 mg by mouth daily.   methocarbamol (ROBAXIN) 500 MG tablet Take 500 mg by mouth every 6 (six) hours as needed for muscle spasms.   mometasone  (ELOCON ) 0.1 % cream Apply topically daily as needed for itch   pantoprazole  (PROTONIX ) 40 MG tablet TAKE 1 TABLET(40 MG) BY MOUTH DAILY   Probiotic Product (PROBIOTIC DAILY) CAPS Hold until Recommended to take by allergy  specialist.   spironolactone (ALDACTONE) 25 MG tablet Take 25  mg by mouth daily.   topiramate  (TOPAMAX ) 25 MG tablet Take 50 mg by mouth daily.   traMADol (ULTRAM) 50 MG tablet Take 50 mg by mouth every 6 (six) hours as needed for moderate pain (pain score 4-6).   tretinoin (RETIN-A) 0.025 % cream Apply a pea-sized amount to entire face every other night to nightly.   valACYclovir  (VALTREX ) 500 MG tablet Take 1 tablet (500 mg total) by mouth daily. Hold until Recommended to take by allergy  specialist.   vitamin  B-12 (CYANOCOBALAMIN) 1000 MCG tablet Take 1,000 mcg by mouth daily.   [DISCONTINUED] levocetirizine (XYZAL ) 5 MG tablet TAKE 1 TABLET(5 MG) BY MOUTH EVERY EVENING   [DISCONTINUED] montelukast  (SINGULAIR ) 10 MG tablet Take one tablet daily in the evening.   fluconazole  (DIFLUCAN ) 150 MG tablet Take one tablet and then repeat in one week if needed. (Patient not taking: Reported on 10/03/2024)   levocetirizine (XYZAL ) 5 MG tablet Take 1 tablet (5 mg total) by mouth every evening.   montelukast  (SINGULAIR ) 10 MG tablet Take one tablet daily in the evening.   No facility-administered encounter medications on file as of 10/03/2024.    Past Medical History:  Diagnosis Date   Abnormal Pap smear    CIN1   Allergy     Anemia    Angio-edema    Arthritis    Asthma    Bladder infection    BV (bacterial vaginosis)    Chlamydia    Fibroids    GERD (gastroesophageal reflux disease)    Headache    Migraines   Heart murmur    Hernia    History of hiatal hernia    HSV-2 infection    Hypertension    Irregular heart beat    PONV (postoperative nausea and vomiting)    during gallbladder surgery, almost died per mother   10/24/02   Vitamin B 12 deficiency    Yeast infection     Past Surgical History:  Procedure Laterality Date   BREAST BIOPSY     BREAST LUMPECTOMY Right    CHOLECYSTECTOMY     COLONOSCOPY     DILATATION & CURETTAGE/HYSTEROSCOPY WITH MYOSURE N/A 02/13/2017   Procedure: DILATATION/HYSTEROSCOPY WITH MYOSURE;  Surgeon: Armond Cape, MD;  Location: [facility];  Service: Gynecology;  Laterality: N/A;  Jaclyn Haynes the rep will be here.  Confirmed on 02/08/17 with chass   DILATATION & CURETTAGE/HYSTEROSCOPY WITH MYOSURE N/A 11/20/2022   Procedure: HYSTEROSCOPY WITH MYOSURE;  Surgeon: Armond Cape, MD;  Location: [facility];  Service: Gynecology;  Laterality: N/A;   DILITATION & CURRETTAGE/HYSTROSCOPY WITH HYDROTHERMAL ABLATION N/A 11/20/2022   Procedure: DILATATION & CURETTAGE/HYSTEROSCOPY WITH  ATTEMPTED  HYDROTHERMAL ABLATION;  Surgeon: Armond Cape, MD;  Location: [facility];  Service: Gynecology;  Laterality: N/A;   HYSTEROSCOPY N/A 02/13/2017   Procedure: ATTEMPTED HYDROTHERMAL ABLATION;  Surgeon: Armond Cape, MD;  Location: [facility];  Service: Gynecology;  Laterality: N/A;  HTA rep will be here     Family History  Problem Relation Age of Onset   Stroke Mother    Hypertension Mother    COPD Mother    Thyroid  nodules Mother    Hyperlipidemia Mother    Eczema Mother    Asthma Mother    Arthritis Mother    Heart disease Father    Hypertension Father    Hyperlipidemia Father    Early death Father    Hypertension Maternal Grandmother    Hyperlipidemia Maternal Grandmother    Diabetes Maternal Grandmother    Thyroid   nodules Maternal Grandmother    Hypertension Maternal Grandfather    Hyperlipidemia Maternal Grandfather    Prostate cancer Maternal Grandfather    Hypertension Paternal Grandmother    Heart disease Paternal Grandfather    Hypertension Paternal Grandfather    Eczema Brother    Anxiety disorder Brother    Allergic rhinitis Neg Hx    Angioedema Neg Hx    Immunodeficiency Neg Hx    Urticaria Neg Hx     Social History   Socioeconomic History   Marital status: Single    Spouse name: Not on file   Number of children: 0   Years of education: Grad   Highest education level: Not on file  Occupational History    Employer: [employer]  Tobacco Use   Smoking status: Never    Passive exposure: Never   Smokeless tobacco: Never  Vaping Use   Vaping status: Never Used  Substance and Sexual Activity   Alcohol use: Yes    Alcohol/week: 1.0 standard drink of alcohol    Types: 1 Glasses of wine per week    Comment: 2 times a year   Drug use: Never   Sexual activity: Yes    Birth control/protection: Pill    Comment: yasmin    Other Topics Concern   Not on file  Social History Narrative   Patient is single, has 0 children   Patient is right handed    Education level is grad school   Caffeine consumption is 1 cup daily   Two story home   Social Drivers of Health   Tobacco Use: Low Risk (08/15/2024)   Patient History    Smoking Tobacco Use: Never    Smokeless Tobacco Use: Never    Passive Exposure: Never  Financial Resource Strain: Not on file  Food Insecurity: Not on file  Transportation Needs: Not on file  Physical Activity: Not on file  Stress: Not on file  Social Connections: Not on file  Intimate Partner Violence: Not on file  Depression (EYV7-0): Not on file  Alcohol Screen: Not on file  Housing: Not on file  Utilities: Not on file  Health Literacy: Not on file    Review of Systems  HENT:  Positive for congestion.   Musculoskeletal:  Positive for joint pain.        Objective    BP 124/70   Pulse 78   Temp 97.9 F (36.6 C) (Temporal)   Ht 5' 4.5 (1.638 m)   Wt 255 lb 8 oz (115.9 kg)   SpO2 97%   BMI 43.18 kg/m   Physical Exam Vitals reviewed.  Constitutional:      General: She is not in acute distress.    Appearance: Normal appearance.  HENT:     Head: Normocephalic and atraumatic.  Cardiovascular:     Rate and Rhythm: Normal rate and regular rhythm.     Pulses: Normal pulses.     Heart sounds: Normal heart sounds.  Pulmonary:     Effort: Pulmonary effort is normal.     Breath sounds: Normal breath sounds.  Skin:    General: Skin is warm and dry.  Neurological:     General: No focal deficit present.     Mental Status: She is alert and oriented to person, place, and time.  Psychiatric:        Mood and Affect: Mood normal.        Behavior: Behavior normal.        Judgment: Judgment normal.  Assessment & Plan:   Problem List Items Addressed This Visit       Cardiovascular and Mediastinum   Migraine without aura and without status migrainosus, not intractable     Other   PND (post-nasal drip) - Primary   Postnasal drip Chronic postnasal drip likely allergic.  - Continue  Xyzal  Q PM. - Restart Singulair  and monitor for improvement. - Consider Atrovent  nasal spray if no improvement in a week. - Refer to allergist Dr. Iva if no improvement with nasal spray.      Relevant Medications   montelukast  (SINGULAIR ) 10 MG tablet   levocetirizine (XYZAL ) 5 MG tablet   Other Relevant Orders   Comprehensive metabolic panel with GFR   CBC   Arthralgia   Arthralgia Joint pain with characteristics of both osteoarthritis and rheumatoid arthritis - Ordered blood tests for rheumatoid arthritis markers. - Consider referral to rheumatology vs PT pending results       Relevant Orders   Cyclic citrul peptide antibody, IgG   ANA   Rheumatoid Factor   C-reactive protein   Sedimentation rate   Comprehensive metabolic panel with GFR   CBC   Morbid obesity (HCC)   Chronic Initiated topiramate  for migraine prevention and off-label weight loss benefit May refer to healthy weight and wellness center with [facility] after evaluating weight loss at follow-up in 6 weeks      Vitamin D deficiency   Vitamin D deficiency Vitamin D level previously low at 11 ng/mL. On high-dose supplementation. - Continue high-dose vitamin D for 8 weeks. - Recheck levels after 8 weeks. - Consider daily maintenance dose if levels remain low.       Assessment and Plan Assessment & Plan Postnasal drip Chronic postnasal drip likely allergic.  - Continue Xyzal  Q PM. - Restart Singulair  and monitor for improvement. - Consider Atrovent  nasal spray if no improvement in a week. - Refer to allergist Dr. Iva if no improvement with nasal spray.  Arthralgia Joint pain with characteristics of both osteoarthritis and rheumatoid arthritis - Ordered blood tests for rheumatoid arthritis markers. - Consider referral to rheumatology vs PT pending results   Migraine Recurrent migraines impacting daily activities. Topiramate  previously effective. - Restart topiramate  at 50 mg, consider nighttime  dosing. - Monitor for side effects and effectiveness.  Vitamin D deficiency Vitamin D level previously low at 11 ng/mL. On high-dose supplementation. - Continue high-dose vitamin D for 8 weeks. - Recheck levels after 8 weeks. - Consider daily maintenance dose if levels remain low.   Morbid obesity (HCC)   Chronic Initiated topiramate  for migraine prevention and off-label weight loss benefit May refer to healthy weight and wellness center with [facility] after evaluating weight loss at follow-up in 6 weeks         Return in about 6 weeks (around 11/14/2024) for F/U with Lauraine.   Lauraine FORBES Pereyra, NP   "

## 2024-10-03 NOTE — Assessment & Plan Note (Signed)
 Vitamin D deficiency Vitamin D level previously low at 11 ng/mL. On high-dose supplementation. - Continue high-dose vitamin D for 8 weeks. - Recheck levels after 8 weeks. - Consider daily maintenance dose if levels remain low.

## 2024-10-03 NOTE — Assessment & Plan Note (Signed)
 Arthralgia Joint pain with characteristics of both osteoarthritis and rheumatoid arthritis - Ordered blood tests for rheumatoid arthritis markers. - Consider referral to rheumatology vs PT pending results

## 2024-10-03 NOTE — Assessment & Plan Note (Signed)
 Postnasal drip Chronic postnasal drip likely allergic.  - Continue Xyzal  Q PM. - Restart Singulair  and monitor for improvement. - Consider Atrovent  nasal spray if no improvement in a week. - Refer to allergist Dr. Iva if no improvement with nasal spray.

## 2024-10-03 NOTE — Assessment & Plan Note (Addendum)
 Chronic Initiated topiramate  for migraine prevention and off-label weight loss benefit May refer to healthy weight and wellness center with [facility] after evaluating weight loss at follow-up in 6 weeks

## 2024-10-06 LAB — ANA: Anti Nuclear Antibody (ANA): NEGATIVE

## 2024-10-06 LAB — CYCLIC CITRUL PEPTIDE ANTIBODY, IGG: Cyclic Citrullin Peptide Ab: <16 U

## 2024-10-06 LAB — RHEUMATOID FACTOR: Rheumatoid fact SerPl-aCnc: <10 [IU]/mL

## 2024-10-07 ENCOUNTER — Ambulatory Visit: Payer: Self-pay | Admitting: Nurse Practitioner

## 2024-10-07 DIAGNOSIS — M1990 Unspecified osteoarthritis, unspecified site: Secondary | ICD-10-CM

## 2024-10-13 ENCOUNTER — Ambulatory Visit: Attending: Otolaryngology | Admitting: Physical Therapy

## 2024-10-13 ENCOUNTER — Encounter: Payer: Self-pay | Admitting: Physical Therapy

## 2024-10-13 DIAGNOSIS — R2681 Unsteadiness on feet: Secondary | ICD-10-CM | POA: Insufficient documentation

## 2024-10-13 DIAGNOSIS — R42 Dizziness and giddiness: Secondary | ICD-10-CM | POA: Diagnosis present

## 2024-10-13 NOTE — Therapy (Signed)
 " OUTPATIENT PHYSICAL THERAPY VESTIBULAR TREATMENT NOTE/DISCHARGE SUMMARY     Patient Name: Jaclyn Haynes MRN: [id number] DOB:07/25/1971, 54 y.o., female Today's Date: 10/13/2024  END OF SESSION:  PT End of Session - 10/13/24 0843     Visit Number 3    Number of Visits 5    Date for Recertification  09/12/24    Authorization Type [employer]    Authorization - Visit Number 1   visit 1 in Nov 19, 2024   Authorization - Number of Visits 90    PT Start Time 0800    PT Stop Time 0839    PT Time Calculation (min) 39 min    Equipment Utilized During Treatment --   harness vest used with SOT   Activity Tolerance Patient tolerated treatment well    Behavior During Therapy WFL for tasks assessed/performed            Past Medical History:  Diagnosis Date   Abnormal Pap smear    CIN1   Allergy     Anemia    Angio-edema    Arthritis    Asthma    Bladder infection    BV (bacterial vaginosis)    Chlamydia    Fibroids    GERD (gastroesophageal reflux disease)    Headache    Migraines   Heart murmur    Hernia    History of hiatal hernia    HSV-2 infection    Hypertension    Irregular heart beat    PONV (postoperative nausea and vomiting)    during gallbladder surgery, almost died per mother   19-Nov-2002   Vitamin B 12 deficiency    Yeast infection    Past Surgical History:  Procedure Laterality Date   BREAST BIOPSY     BREAST LUMPECTOMY Right    CHOLECYSTECTOMY     COLONOSCOPY     DILATATION & CURETTAGE/HYSTEROSCOPY WITH MYOSURE N/A 02/13/2017   Procedure: DILATATION/HYSTEROSCOPY WITH MYOSURE;  Surgeon: Armond Cape, MD;  Location: [facility];  Service: Gynecology;  Laterality: N/A;  Clint the rep will be here.  Confirmed on 02/08/17 with chass   DILATATION & CURETTAGE/HYSTEROSCOPY WITH MYOSURE N/A 11/20/2022   Procedure: HYSTEROSCOPY WITH MYOSURE;  Surgeon: Armond Cape, MD;  Location: [facility];  Service: Gynecology;  Laterality: N/A;   DILITATION & CURRETTAGE/HYSTROSCOPY WITH  HYDROTHERMAL ABLATION N/A 11/20/2022   Procedure: DILATATION & CURETTAGE/HYSTEROSCOPY WITH ATTEMPTED  HYDROTHERMAL ABLATION;  Surgeon: Armond Cape, MD;  Location: [facility];  Service: Gynecology;  Laterality: N/A;   HYSTEROSCOPY N/A 02/13/2017   Procedure: ATTEMPTED HYDROTHERMAL ABLATION;  Surgeon: Armond Cape, MD;  Location: [facility];  Service: Gynecology;  Laterality: N/A;  HTA rep will be here    Patient Active Problem List   Diagnosis Date Noted   PND (post-nasal drip) 10/03/2024   Arthralgia 10/03/2024   Morbid obesity (HCC) 10/03/2024   Vitamin D deficiency 10/03/2024   Chronic eczematous otitis externa of both ears 06/24/2024   Angioedema of lips 05/21/2016   Allergic reaction 05/21/2016   Palpitation 08/04/2015   Migraine without aura and without status migrainosus, not intractable 10/27/2014   Dizziness 10/27/2013   Disturbance of skin sensation 10/27/2013    PCP: Alvera Reagin, PA REFERRING PROVIDER: Karis Clunes, MD  REFERRING DIAG: R42 (ICD-10-CM) - Dizziness  THERAPY DIAG:  Unsteadiness on feet  Dizziness and giddiness  ONSET DATE: Referral date 06-24-24  Rationale for Evaluation and Treatment: Rehabilitation  SUBJECTIVE:   SUBJECTIVE STATEMENT: Pt reports she has neurology appt with Dr. Tobie on  12-24-24; pt states symptoms overall are pretty much the same - continues to have intermittent unsteadiness during ambulation at times - just occurs spontaneously.  Has completed DHI and returns it at today's session.  (Calculated score = 42% - moderate disability category). Ready for discharge today as status/symptoms basically remain the same  Pt accompanied by: self  PERTINENT HISTORY: h/o migraines  PAIN:  Are you having pain? No; reports mild HA  PRECAUTIONS: None  RED FLAGS: None   WEIGHT BEARING RESTRICTIONS: No  FALLS: Has patient fallen in last 6 months? No   PLOF: Independent  PATIENT GOALS: unsure - is here per Dr. Rojean referral   OBJECTIVE:   Note: Objective measures were completed at Evaluation unless otherwise noted.  DIAGNOSTIC FINDINGS: none since 2020 (MRI was normal)  COGNITION: Overall cognitive status: Within functional limits for tasks assessed   GAIT: Gait pattern: [facility] Distance walked: 9' Assistive device utilized: None Level of assistance: Complete Independence  VESTIBULAR ASSESSMENT:  GENERAL OBSERVATION: pt amb. Independently without device - has h/o migraines; migraines were previously managed by Dr. Lindy but he moved several years ago - pt states she is still on Topamax     SYMPTOM BEHAVIOR:  Subjective history: pt reports dizziness for several years; says it has progressively gotten worse within past 1 - 1.5 yrs so she saw Dr. Karis; no etiology was found at that appt.  Non-Vestibular symptoms: changes in hearing, headaches, migraine symptoms, and migraines cause nausea; some piercing pain  Type of dizziness: Imbalance (Disequilibrium), Unsteady with head/body turns, Funny feeling in the head, and h/o spinning but is rare - occurred with migraine  Frequency: varies  Duration: varies - lasts for hours  Aggravating factors: Spontaneous, Induced by motion: turning body quickly and turning head quickly, Worse outside or in busy environment, and dairy products  Relieving factors: head stationary, lying supine, medication, dark room, and slow movements  Progression of symptoms: worse - says more things are impacting it  OCULOMOTOR EXAM:  Ocular Alignment: normal  Ocular ROM: No Limitations  Spontaneous Nystagmus: absent  Gaze-Induced Nystagmus: absent  Smooth Pursuits: intact  Saccades: intact   FRENZEL - FIXATION SUPRESSED:  Ocular Alignment: normal  Spontaneous Nystagmus: absent  Gaze-Induced Nystagmus: absent   VESTIBULAR - OCULAR REFLEX:    Dynamic Visual Acuity: Static: line 11 Dynamic: line 10 C/o dizziness upon completion of test   POSITIONAL TESTING: Other: NT based on subjective  history  MOTION SENSITIVITY:  Motion Sensitivity Quotient Intensity: 0 = none, 1 = Lightheaded, 2 = Mild, 3 = Moderate, 4 = Severe, 5 = Vomiting  Intensity  1. Sitting to supine   2. Supine to L side   3. Supine to R side   4. Supine to sitting   5. L Hallpike-Dix   6. Up from L    7. R Hallpike-Dix   8. Up from R    9. Sitting, head tipped to L knee   10. Head up from L knee   11. Sitting, head tipped to R knee   12. Head up from R knee   13. Sitting head turns x5   14.Sitting head nods x5   15. In stance, 180 turn to L    16. In stance, 180 turn to R     OTHOSTATICS: not done  FUNCTIONAL GAIT: MCTSIB: Condition 1: Avg of 3 trials: 30 sec, Condition 2: Avg of 3 trials: 30 sec, Condition 3: Avg of 3 trials: 30 sec, Condition 4: Avg of  3 trials: 30 sec, and Total Score: 120/120                                                                                                                             TREATMENT DATE: 10-13-24  Self Care: Calculated DHI - score 42% (7 YES answers and 7 Sometimes answers, 11 NO answers) - moderate handicap category  Reviewed HEP - pt currently not using foam or compliant surface for exercise with standing with EC - recommended to pt that she add compliant surface (old bed pillow or patio cushion or blanket) for increased challenge for vestibular system  Pt given copy of SOT to discuss with neurologist at upcoming appt   Gait:  FGA   [facility] PT Assessment - 10/13/24 0001       Functional Gait  Assessment   Gait assessed  Yes    Gait Level Surface Walks 20 ft in less than 7 sec but greater than 5.5 sec, uses assistive device, slower speed, mild gait deviations, or deviates 6-10 in outside of the 12 in walkway width.   6.4 secs   Change in Gait Speed Able to smoothly change walking speed without loss of balance or gait deviation. Deviate no more than 6 in outside of the 12 in walkway width.    Gait with Horizontal Head Turns Performs head turns  smoothly with slight change in gait velocity (eg, minor disruption to smooth gait path), deviates 6-10 in outside 12 in walkway width, or uses an assistive device.    Gait with Vertical Head Turns Performs head turns with no change in gait. Deviates no more than 6 in outside 12 in walkway width.    Gait and Pivot Turn Pivot turns safely within 3 sec and stops quickly with no loss of balance.    Step Over Obstacle Is able to step over one shoe box (4.5 in total height) without changing gait speed. No evidence of imbalance.    Gait with Narrow Base of Support Is able to ambulate for 10 steps heel to toe with no staggering.    Gait with Eyes Closed Walks 20 ft, no assistive devices, good speed, no evidence of imbalance, normal gait pattern, deviates no more than 6 in outside 12 in walkway width. Ambulates 20 ft in less than 7 sec.    Ambulating Backwards Walks 20 ft, no assistive devices, good speed, no evidence for imbalance, normal gait    Steps Alternating feet, must use rail.    Total Score 26          NeuroRe-ed:  SVA - line 11;  DVA line 9 (WNL's); pt reported min. Dizziness upon completion of test but did report feeling sensation on Rt side of her head; pt stated the dizziness would probably start when she walked back to the mat but stated she had learned what to do to keep it from occurring excessively  mCTSIB:  condition 1 - 30 secs:  condition 2 - 30 secs;  condition 3 - 30 secs;  condition 4 - 24.97 secs (1 trial only)    PATIENT EDUCATION: Education details: pt instructed in HEP - x1 viewing and balance on foam; also given pt handout from [facility] on Migraines and Dizziness and hydrops and Meniere's Person educated: Patient Education method: Explanation, Demonstration, and Handouts Education comprehension: verbalized understanding and returned demonstration  HOME EXERCISE PROGRAM:  see below  Access Code: [email or web address] URL: [email or web address] Date: 08/15/2024 Prepared  by: Rock Kussmaul  Exercises - Standing Gaze Stabilization with Head Rotation  - 3 x daily - 7 x weekly - 1 sets - 1 reps - 30 secs hold - Standing on foam pad - Eyes open and then with Eyes closed   - 2 x daily - 7 x weekly - 1 sets - 1 reps - 30 sec hold  GOALS: Goals reviewed with patient? Yes  SHORT TERM GOALS: same as LTG's   LONG TERM GOALS: Target date: 09-12-24  Pt will be independent with HEP for balance and gaze stabilization. Baseline:  Goal status: Goal met 10-13-24  2.  Pt will verbalize understanding of diet recommendations related to Hydrops and migraines.   Baseline:  Goal status: Goal met 10-13-24   ASSESSMENT:  CLINICAL IMPRESSION:   Pt has met 2/2 LTG's; pt's DVA remains WNL's with a 2 line difference with pt reporting min. Dizziness upon completion of test.  Pt's FGA score = 26/30.  Pt continues to demonstrate mild decrease in vestibular input in maintaining balance with pt able to stand with EC on compliant surface for 24.97 secs.  Pt's dizziness/dysequilibrium appears to be multi-factorial in etiology.  D/C due to program completion and pt reporting no major changes in functional status or c/o dizziness at this time.     OBJECTIVE IMPAIRMENTS: decreased balance and dizziness.   ACTIVITY LIMITATIONS: N/A  PARTICIPATION LIMITATIONS: N/A  PERSONAL FACTORS: Past/current experiences, Time since onset of injury/illness/exacerbation, and h/o migraines are also affecting patient's functional outcome.   REHAB POTENTIAL: Fair due to unknown etiology of dizziness  CLINICAL DECISION MAKING: Evolving/moderate complexity  EVALUATION COMPLEXITY: Moderate   PLAN:  PT FREQUENCY: 1x/week  PT DURATION: 4 weeks + eval  PLANNED INTERVENTIONS: 97110-Therapeutic exercises, 97530- Therapeutic activity, 97112- Neuromuscular re-education, and 02464- Self Care  PLAN FOR NEXT SESSION: D/C on 10-13-24    PHYSICAL THERAPY DISCHARGE SUMMARY  Visits from Start of Care:  3  Current functional level related to goals / functional outcomes: See above for progress towards goals   Remaining deficits: Continued mildly decreased vestibular input in maintaining balance; mild decrease in balance with pt scoring 26/30 on FGA Continued c/o dizziness, pressure on Rt side of head, itching in Rt ear at times   Education / Equipment: Pt has been instructed in HEP for vestibular exercises - reports compliance with this HEP  Patient agrees to discharge. Patient goals were met. Patient is being discharged due to meeting the stated rehab goals. Etiology of dizziness/symptoms remains unknown; pt is scheduled to see Dr. Tobie (neurologist on 12-24-24).    Kussmaul [place], PT 10/13/2024, 10:18 AM  "

## 2024-10-22 ENCOUNTER — Other Ambulatory Visit: Payer: Self-pay | Admitting: Nurse Practitioner

## 2024-10-22 ENCOUNTER — Telehealth: Payer: Self-pay

## 2024-10-22 DIAGNOSIS — R002 Palpitations: Secondary | ICD-10-CM

## 2024-10-22 DIAGNOSIS — R42 Dizziness and giddiness: Secondary | ICD-10-CM

## 2024-10-22 DIAGNOSIS — I10 Essential (primary) hypertension: Secondary | ICD-10-CM

## 2024-10-22 MED ORDER — HYDROCHLOROTHIAZIDE 12.5 MG PO TABS: 12.5000 mg | ORAL_TABLET | ORAL | 3 refills | Status: AC | PRN

## 2024-10-22 MED ORDER — MECLIZINE HCL 12.5 MG PO TABS: 12.5000 mg | ORAL_TABLET | Freq: Three times a day (TID) | ORAL | 2 refills | Status: AC | PRN

## 2024-10-22 MED ORDER — CARVEDILOL 25 MG PO TABS: 25.0000 mg | ORAL_TABLET | Freq: Two times a day (BID) | ORAL | 3 refills | Status: AC

## 2024-10-22 NOTE — Progress Notes (Signed)
 Estimated Creatinine Clearance: 102.6 mL/min (by C-G formula based on SCr of 0.77 mg/dL).

## 2024-10-22 NOTE — Telephone Encounter (Signed)
 Copied from CRM [id number]. Topic: Clinical - Medication Refill >> Oct 22, 2024 12:28 PM Zebedee SAUNDERS wrote: Medication: carvedilol  (COREG ) 25 MG tablet, hydrochlorothiazide  (HYDRODIURIL ) 12.5 MG tablet, meclizine  (for motion sickness, as discussed at appt.)  Has the patient contacted their pharmacy? Yes (Agent: If no, request that the patient contact the pharmacy for the refill. If patient does not wish to contact the pharmacy document the reason why and proceed with request.) (Agent: If yes, when and what did the pharmacy advise?)Pharmacy need script  This is the patient's preferred pharmacy:  [id number] - [place], [place] - [address] AT [facility] [address] [place] [address] Phone: [phone] Fax: [phone]  Is this the correct pharmacy for this prescription? Yes If no, delete pharmacy and type the correct one.   Has the prescription been filled recently? Yes  Is the patient out of the medication? Yes  Has the patient been seen for an appointment in the last year OR does the patient have an upcoming appointment? Yes  Can we respond through MyChart? Yes  Agent: Please be advised that Rx refills may take up to 3 business days. We ask that you follow-up with your pharmacy.

## 2024-11-21 ENCOUNTER — Ambulatory Visit: Admitting: Nurse Practitioner

## 2024-12-24 ENCOUNTER — Ambulatory Visit: Payer: Self-pay | Admitting: Neurology
